# Patient Record
Sex: Female | Born: 1980 | Race: Black or African American | Hispanic: No | Marital: Single | Smoking: Current some day smoker
Health system: Southern US, Community
[De-identification: ages and names within clinical notes are randomized; demographics above are authoritative.]

## PROBLEM LIST (undated history)

## (undated) DIAGNOSIS — I2510 Atherosclerotic heart disease of native coronary artery without angina pectoris: Secondary | ICD-10-CM

## (undated) DIAGNOSIS — N200 Calculus of kidney: Secondary | ICD-10-CM

## (undated) DIAGNOSIS — R350 Frequency of micturition: Secondary | ICD-10-CM

## (undated) DIAGNOSIS — N201 Calculus of ureter: Secondary | ICD-10-CM

## (undated) DIAGNOSIS — N7011 Chronic salpingitis: Secondary | ICD-10-CM

## (undated) DIAGNOSIS — N261 Atrophy of kidney (terminal): Secondary | ICD-10-CM

## (undated) DIAGNOSIS — F419 Anxiety disorder, unspecified: Secondary | ICD-10-CM

## (undated) DIAGNOSIS — I10 Essential (primary) hypertension: Secondary | ICD-10-CM

## (undated) DIAGNOSIS — N390 Urinary tract infection, site not specified: Secondary | ICD-10-CM

## (undated) DIAGNOSIS — N1831 Chronic kidney disease, stage 3a: Secondary | ICD-10-CM

## (undated) DIAGNOSIS — F32A Depression, unspecified: Secondary | ICD-10-CM

## (undated) DIAGNOSIS — R102 Pelvic and perineal pain: Secondary | ICD-10-CM

## (undated) DIAGNOSIS — R3915 Urgency of urination: Secondary | ICD-10-CM

## (undated) DIAGNOSIS — R519 Headache, unspecified: Secondary | ICD-10-CM

## (undated) DIAGNOSIS — R0602 Shortness of breath: Secondary | ICD-10-CM

## (undated) DIAGNOSIS — I5032 Chronic diastolic (congestive) heart failure: Secondary | ICD-10-CM

## (undated) DIAGNOSIS — Z87442 Personal history of urinary calculi: Secondary | ICD-10-CM

## (undated) HISTORY — PX: CYSTOSCOPY/URETEROSCOPY/HOLMIUM LASER/STENT PLACEMENT: SHX6546

## (undated) HISTORY — DX: Atherosclerotic heart disease of native coronary artery without angina pectoris: I25.10

## (undated) HISTORY — PX: APPENDECTOMY: SHX54

## (undated) HISTORY — DX: Chronic diastolic (congestive) heart failure: I50.32

## (undated) HISTORY — PX: KIDNEY SURGERY: SHX687

## (undated) HISTORY — DX: Chronic kidney disease, stage 3a: N18.31

---

## 2000-06-16 ENCOUNTER — Encounter: Payer: Self-pay | Admitting: *Deleted

## 2000-06-16 ENCOUNTER — Ambulatory Visit (HOSPITAL_COMMUNITY): Admission: AD | Admit: 2000-06-16 | Discharge: 2000-06-16 | Payer: Self-pay | Admitting: *Deleted

## 2000-06-18 ENCOUNTER — Ambulatory Visit (HOSPITAL_COMMUNITY): Admission: AD | Admit: 2000-06-18 | Discharge: 2000-06-18 | Payer: Self-pay | Admitting: *Deleted

## 2000-06-24 ENCOUNTER — Inpatient Hospital Stay (HOSPITAL_COMMUNITY): Admission: RE | Admit: 2000-06-24 | Discharge: 2000-06-27 | Payer: Self-pay | Admitting: *Deleted

## 2000-09-22 ENCOUNTER — Other Ambulatory Visit: Admission: RE | Admit: 2000-09-22 | Discharge: 2000-09-22 | Payer: Self-pay | Admitting: *Deleted

## 2001-02-18 ENCOUNTER — Emergency Department (HOSPITAL_COMMUNITY): Admission: EM | Admit: 2001-02-18 | Discharge: 2001-02-18 | Payer: Self-pay | Admitting: Emergency Medicine

## 2001-10-29 ENCOUNTER — Emergency Department (HOSPITAL_COMMUNITY): Admission: EM | Admit: 2001-10-29 | Discharge: 2001-10-29 | Payer: Self-pay | Admitting: *Deleted

## 2002-02-16 ENCOUNTER — Emergency Department (HOSPITAL_COMMUNITY): Admission: EM | Admit: 2002-02-16 | Discharge: 2002-02-16 | Payer: Self-pay | Admitting: *Deleted

## 2002-03-02 ENCOUNTER — Inpatient Hospital Stay (HOSPITAL_COMMUNITY): Admission: EM | Admit: 2002-03-02 | Discharge: 2002-03-03 | Payer: Self-pay | Admitting: Emergency Medicine

## 2002-06-14 ENCOUNTER — Encounter: Payer: Self-pay | Admitting: *Deleted

## 2002-06-14 ENCOUNTER — Ambulatory Visit (HOSPITAL_COMMUNITY): Admission: RE | Admit: 2002-06-14 | Discharge: 2002-06-14 | Payer: Self-pay | Admitting: *Deleted

## 2002-08-31 ENCOUNTER — Ambulatory Visit (HOSPITAL_COMMUNITY): Admission: RE | Admit: 2002-08-31 | Discharge: 2002-08-31 | Payer: Self-pay | Admitting: *Deleted

## 2002-08-31 ENCOUNTER — Encounter: Payer: Self-pay | Admitting: *Deleted

## 2002-09-20 ENCOUNTER — Inpatient Hospital Stay (HOSPITAL_COMMUNITY): Admission: RE | Admit: 2002-09-20 | Discharge: 2002-09-22 | Payer: Self-pay | Admitting: *Deleted

## 2004-01-21 ENCOUNTER — Emergency Department (HOSPITAL_COMMUNITY): Admission: EM | Admit: 2004-01-21 | Discharge: 2004-01-21 | Payer: Self-pay | Admitting: *Deleted

## 2005-12-20 ENCOUNTER — Emergency Department (HOSPITAL_COMMUNITY): Admission: EM | Admit: 2005-12-20 | Discharge: 2005-12-20 | Payer: Self-pay | Admitting: Emergency Medicine

## 2006-01-19 ENCOUNTER — Other Ambulatory Visit: Admission: RE | Admit: 2006-01-19 | Discharge: 2006-01-19 | Payer: Self-pay | Admitting: Unknown Physician Specialty

## 2006-01-19 ENCOUNTER — Encounter (INDEPENDENT_AMBULATORY_CARE_PROVIDER_SITE_OTHER): Payer: Self-pay | Admitting: Specialist

## 2006-02-01 ENCOUNTER — Emergency Department (HOSPITAL_COMMUNITY): Admission: EM | Admit: 2006-02-01 | Discharge: 2006-02-01 | Payer: Self-pay | Admitting: Emergency Medicine

## 2006-07-14 ENCOUNTER — Emergency Department (HOSPITAL_COMMUNITY): Admission: EM | Admit: 2006-07-14 | Discharge: 2006-07-14 | Payer: Self-pay | Admitting: Emergency Medicine

## 2006-07-16 ENCOUNTER — Emergency Department (HOSPITAL_COMMUNITY): Admission: EM | Admit: 2006-07-16 | Discharge: 2006-07-16 | Payer: Self-pay | Admitting: Emergency Medicine

## 2006-07-21 ENCOUNTER — Ambulatory Visit (HOSPITAL_COMMUNITY): Admission: RE | Admit: 2006-07-21 | Discharge: 2006-07-21 | Payer: Self-pay | Admitting: Urology

## 2006-07-29 ENCOUNTER — Ambulatory Visit (HOSPITAL_COMMUNITY): Admission: RE | Admit: 2006-07-29 | Discharge: 2006-07-29 | Payer: Self-pay | Admitting: Urology

## 2006-09-02 ENCOUNTER — Ambulatory Visit (HOSPITAL_COMMUNITY): Admission: RE | Admit: 2006-09-02 | Discharge: 2006-09-02 | Payer: Self-pay | Admitting: Urology

## 2006-09-21 ENCOUNTER — Ambulatory Visit (HOSPITAL_COMMUNITY): Admission: RE | Admit: 2006-09-21 | Discharge: 2006-09-21 | Payer: Self-pay | Admitting: Urology

## 2006-11-02 ENCOUNTER — Ambulatory Visit (HOSPITAL_COMMUNITY): Admission: RE | Admit: 2006-11-02 | Discharge: 2006-11-02 | Payer: Self-pay | Admitting: Urology

## 2007-01-25 ENCOUNTER — Other Ambulatory Visit: Admission: RE | Admit: 2007-01-25 | Discharge: 2007-01-25 | Payer: Self-pay | Admitting: Unknown Physician Specialty

## 2007-01-25 ENCOUNTER — Encounter (INDEPENDENT_AMBULATORY_CARE_PROVIDER_SITE_OTHER): Payer: Self-pay | Admitting: Unknown Physician Specialty

## 2007-06-20 ENCOUNTER — Emergency Department (HOSPITAL_COMMUNITY): Admission: EM | Admit: 2007-06-20 | Discharge: 2007-06-21 | Payer: Self-pay | Admitting: Emergency Medicine

## 2008-03-19 ENCOUNTER — Emergency Department (HOSPITAL_COMMUNITY): Admission: EM | Admit: 2008-03-19 | Discharge: 2008-03-19 | Payer: Self-pay | Admitting: Emergency Medicine

## 2008-05-25 ENCOUNTER — Inpatient Hospital Stay (HOSPITAL_COMMUNITY): Admission: EM | Admit: 2008-05-25 | Discharge: 2008-05-26 | Payer: Self-pay | Admitting: Emergency Medicine

## 2008-05-28 ENCOUNTER — Ambulatory Visit (HOSPITAL_COMMUNITY): Admission: RE | Admit: 2008-05-28 | Discharge: 2008-05-28 | Payer: Self-pay | Admitting: Urology

## 2008-07-18 ENCOUNTER — Ambulatory Visit (HOSPITAL_COMMUNITY): Admission: RE | Admit: 2008-07-18 | Discharge: 2008-07-18 | Payer: Self-pay | Admitting: Urology

## 2008-07-25 ENCOUNTER — Ambulatory Visit (HOSPITAL_COMMUNITY): Admission: RE | Admit: 2008-07-25 | Discharge: 2008-07-25 | Payer: Self-pay | Admitting: Urology

## 2008-09-13 ENCOUNTER — Ambulatory Visit (HOSPITAL_COMMUNITY): Admission: RE | Admit: 2008-09-13 | Discharge: 2008-09-13 | Payer: Self-pay | Admitting: Urology

## 2008-10-10 ENCOUNTER — Ambulatory Visit (HOSPITAL_COMMUNITY): Admission: RE | Admit: 2008-10-10 | Discharge: 2008-10-10 | Payer: Self-pay | Admitting: Urology

## 2008-10-17 ENCOUNTER — Ambulatory Visit (HOSPITAL_COMMUNITY): Admission: RE | Admit: 2008-10-17 | Discharge: 2008-10-17 | Payer: Self-pay | Admitting: Urology

## 2008-11-05 ENCOUNTER — Inpatient Hospital Stay (HOSPITAL_COMMUNITY): Admission: EM | Admit: 2008-11-05 | Discharge: 2008-11-06 | Payer: Self-pay | Admitting: Emergency Medicine

## 2009-05-09 ENCOUNTER — Ambulatory Visit (HOSPITAL_COMMUNITY): Admission: RE | Admit: 2009-05-09 | Discharge: 2009-05-09 | Payer: Self-pay | Admitting: Urology

## 2009-05-16 ENCOUNTER — Ambulatory Visit (HOSPITAL_COMMUNITY): Admission: RE | Admit: 2009-05-16 | Discharge: 2009-05-16 | Payer: Self-pay | Admitting: Urology

## 2009-06-10 ENCOUNTER — Ambulatory Visit (HOSPITAL_COMMUNITY): Admission: RE | Admit: 2009-06-10 | Discharge: 2009-06-10 | Payer: Self-pay | Admitting: Urology

## 2009-07-25 ENCOUNTER — Ambulatory Visit (HOSPITAL_COMMUNITY): Admission: RE | Admit: 2009-07-25 | Discharge: 2009-07-25 | Payer: Self-pay | Admitting: Urology

## 2009-09-02 ENCOUNTER — Ambulatory Visit (HOSPITAL_COMMUNITY): Admission: RE | Admit: 2009-09-02 | Discharge: 2009-09-02 | Payer: Self-pay | Admitting: Urology

## 2009-09-28 ENCOUNTER — Emergency Department (HOSPITAL_COMMUNITY): Admission: EM | Admit: 2009-09-28 | Discharge: 2009-09-28 | Payer: Self-pay | Admitting: Emergency Medicine

## 2010-03-09 ENCOUNTER — Encounter: Payer: Self-pay | Admitting: Urology

## 2010-04-09 ENCOUNTER — Other Ambulatory Visit (HOSPITAL_COMMUNITY): Payer: Self-pay | Admitting: Urology

## 2010-04-14 ENCOUNTER — Other Ambulatory Visit (HOSPITAL_COMMUNITY): Payer: Self-pay | Admitting: Urology

## 2010-04-14 DIAGNOSIS — N200 Calculus of kidney: Secondary | ICD-10-CM

## 2010-04-14 DIAGNOSIS — N390 Urinary tract infection, site not specified: Secondary | ICD-10-CM

## 2010-04-16 ENCOUNTER — Ambulatory Visit (HOSPITAL_COMMUNITY)
Admission: RE | Admit: 2010-04-16 | Discharge: 2010-04-16 | Disposition: A | Discharge status: Home / Self Care | Payer: Medicaid Other | Source: Ambulatory Visit | Attending: Urology | Admitting: Urology

## 2010-04-16 DIAGNOSIS — R1031 Right lower quadrant pain: Secondary | ICD-10-CM | POA: Insufficient documentation

## 2010-04-16 DIAGNOSIS — R1032 Left lower quadrant pain: Secondary | ICD-10-CM | POA: Insufficient documentation

## 2010-04-16 DIAGNOSIS — N200 Calculus of kidney: Secondary | ICD-10-CM | POA: Insufficient documentation

## 2010-04-16 DIAGNOSIS — N390 Urinary tract infection, site not specified: Secondary | ICD-10-CM

## 2010-05-02 LAB — URINE MICROSCOPIC-ADD ON

## 2010-05-02 LAB — URINALYSIS, ROUTINE W REFLEX MICROSCOPIC
Bilirubin Urine: NEGATIVE
Ketones, ur: NEGATIVE mg/dL
Specific Gravity, Urine: 1.02 (ref 1.005–1.030)
Urobilinogen, UA: 0.2 mg/dL (ref 0.0–1.0)

## 2010-05-02 LAB — BASIC METABOLIC PANEL
Calcium: 9.4 mg/dL (ref 8.4–10.5)
Chloride: 112 mEq/L (ref 96–112)
Creatinine, Ser: 1.05 mg/dL (ref 0.4–1.2)
GFR calc Af Amer: >60 mL/min (ref >60)

## 2010-05-02 LAB — CBC
MCV: 82.2 fL (ref 78.0–100.0)
Platelets: 259 10*3/uL (ref 150–400)
RBC: 4.68 MIL/uL (ref 3.87–5.11)
WBC: 9.5 10*3/uL (ref 4.0–10.5)

## 2010-05-02 LAB — DIFFERENTIAL
Lymphocytes Relative: 31 % (ref 12–46)
Lymphs Abs: 2.9 10*3/uL (ref 0.7–4.0)
Neutrophils Relative %: 61 % (ref 43–77)

## 2010-05-02 LAB — POCT PREGNANCY, URINE: Preg Test, Ur: NEGATIVE

## 2010-05-09 LAB — CBC
Hemoglobin: 11.9 g/dL — ABNORMAL LOW (ref 12.0–15.0)
MCHC: 33.3 g/dL (ref 30.0–36.0)
MCV: 82.8 fL (ref 78.0–100.0)
RBC: 4.31 MIL/uL (ref 3.87–5.11)

## 2010-05-09 LAB — BASIC METABOLIC PANEL
CO2: 26 mEq/L (ref 19–32)
Chloride: 107 mEq/L (ref 96–112)
GFR calc Af Amer: >60 mL/min (ref >60)
Potassium: 3.7 mEq/L (ref 3.5–5.1)
Sodium: 139 mEq/L (ref 135–145)

## 2010-05-21 ENCOUNTER — Encounter (HOSPITAL_COMMUNITY): Payer: Medicaid Other

## 2010-05-21 ENCOUNTER — Other Ambulatory Visit: Payer: Self-pay | Admitting: Urology

## 2010-05-21 LAB — SURGICAL PCR SCREEN
MRSA, PCR: NEGATIVE
Staphylococcus aureus: NEGATIVE

## 2010-05-21 LAB — HEMOGLOBIN AND HEMATOCRIT, BLOOD: HCT: 37.2 % (ref 36.0–46.0)

## 2010-05-21 LAB — BASIC METABOLIC PANEL
BUN: 9 mg/dL (ref 6–23)
CO2: 23 mEq/L (ref 19–32)
Chloride: 109 mEq/L (ref 96–112)
Creatinine, Ser: 0.83 mg/dL (ref 0.4–1.2)

## 2010-05-22 ENCOUNTER — Ambulatory Visit (HOSPITAL_COMMUNITY): Payer: Medicaid Other

## 2010-05-22 ENCOUNTER — Ambulatory Visit (HOSPITAL_COMMUNITY)
Admission: RE | Admit: 2010-05-22 | Discharge: 2010-05-22 | Disposition: A | Discharge status: Home / Self Care | Payer: Medicaid Other | Source: Ambulatory Visit | Attending: Urology | Admitting: Urology

## 2010-05-22 DIAGNOSIS — N201 Calculus of ureter: Secondary | ICD-10-CM | POA: Insufficient documentation

## 2010-05-23 LAB — DIFFERENTIAL
Eosinophils Absolute: 0.3 10*3/uL (ref 0.0–0.7)
Eosinophils Relative: 3 % (ref 0–5)
Lymphocytes Relative: 40 % (ref 12–46)
Lymphs Abs: 3.3 10*3/uL (ref 0.7–4.0)
Lymphs Abs: 3.5 10*3/uL (ref 0.7–4.0)
Monocytes Absolute: 0.4 10*3/uL (ref 0.1–1.0)
Monocytes Relative: 5 % (ref 3–12)
Monocytes Relative: 7 % (ref 3–12)
Neutro Abs: 4.3 10*3/uL (ref 1.7–7.7)
Neutrophils Relative %: 52 % (ref 43–77)

## 2010-05-23 LAB — BASIC METABOLIC PANEL
BUN: 12 mg/dL (ref 6–23)
CO2: 24 mEq/L (ref 19–32)
Calcium: 9.2 mg/dL (ref 8.4–10.5)
Chloride: 107 mEq/L (ref 96–112)
Creatinine, Ser: 0.77 mg/dL (ref 0.4–1.2)
GFR calc Af Amer: >60 mL/min (ref >60)
GFR calc non Af Amer: >60 mL/min (ref >60)
GFR calc non Af Amer: >60 mL/min (ref >60)
Glucose, Bld: 110 mg/dL — ABNORMAL HIGH (ref 70–99)
Potassium: 3.6 mEq/L (ref 3.5–5.1)
Sodium: 137 mEq/L (ref 135–145)
Sodium: 139 mEq/L (ref 135–145)

## 2010-05-23 LAB — CBC
HCT: 32.3 % — ABNORMAL LOW (ref 36.0–46.0)
Hemoglobin: 10.9 g/dL — ABNORMAL LOW (ref 12.0–15.0)
Hemoglobin: 12.1 g/dL (ref 12.0–15.0)
MCV: 82.3 fL (ref 78.0–100.0)
Platelets: 265 10*3/uL (ref 150–400)
RBC: 4.44 MIL/uL (ref 3.87–5.11)
RDW: 13.6 % (ref 11.5–15.5)
WBC: 8.3 10*3/uL (ref 4.0–10.5)

## 2010-05-23 LAB — URINALYSIS, ROUTINE W REFLEX MICROSCOPIC
Nitrite: POSITIVE — AB
Protein, ur: 100 mg/dL — AB
Specific Gravity, Urine: 1.02 (ref 1.005–1.030)
Urobilinogen, UA: 0.2 mg/dL (ref 0.0–1.0)

## 2010-05-23 LAB — URINE CULTURE: Colony Count: 8000

## 2010-05-23 LAB — URINE MICROSCOPIC-ADD ON

## 2010-05-23 LAB — PREGNANCY, URINE: Preg Test, Ur: NEGATIVE

## 2010-05-24 LAB — HCG, QUANTITATIVE, PREGNANCY: hCG, Beta Chain, Quant, S: <2 m[IU]/mL (ref <5)

## 2010-05-27 LAB — URINALYSIS, MICROSCOPIC ONLY
Bilirubin Urine: NEGATIVE
Glucose, UA: NEGATIVE mg/dL
Specific Gravity, Urine: 1.025 (ref 1.005–1.030)
pH: 6 (ref 5.0–8.0)

## 2010-05-28 LAB — DIFFERENTIAL
Basophils Absolute: 0.1 10*3/uL (ref 0.0–0.1)
Basophils Relative: 0 % (ref 0–1)
Eosinophils Absolute: 0 10*3/uL (ref 0.0–0.7)
Eosinophils Absolute: 0.1 10*3/uL (ref 0.0–0.7)
Eosinophils Relative: 1 % (ref 0–5)
Lymphs Abs: 3.5 10*3/uL (ref 0.7–4.0)
Monocytes Absolute: 1.1 10*3/uL — ABNORMAL HIGH (ref 0.1–1.0)
Neutro Abs: 9 10*3/uL — ABNORMAL HIGH (ref 1.7–7.7)
Neutrophils Relative %: 77 % (ref 43–77)

## 2010-05-28 LAB — BASIC METABOLIC PANEL
BUN: 14 mg/dL (ref 6–23)
BUN: 14 mg/dL (ref 6–23)
CO2: 26 mEq/L (ref 19–32)
Calcium: 8.2 mg/dL — ABNORMAL LOW (ref 8.4–10.5)
Chloride: 106 mEq/L (ref 96–112)
Chloride: 112 mEq/L (ref 96–112)
Creatinine, Ser: 1.5 mg/dL — ABNORMAL HIGH (ref 0.4–1.2)
GFR calc Af Amer: 50 mL/min — ABNORMAL LOW (ref >60)
Glucose, Bld: 105 mg/dL — ABNORMAL HIGH (ref 70–99)
Glucose, Bld: 91 mg/dL (ref 70–99)
Potassium: 3.4 mEq/L — ABNORMAL LOW (ref 3.5–5.1)
Sodium: 141 mEq/L (ref 135–145)

## 2010-05-28 LAB — CBC
HCT: 34.5 % — ABNORMAL LOW (ref 36.0–46.0)
Hemoglobin: 11.4 g/dL — ABNORMAL LOW (ref 12.0–15.0)
MCHC: 33.8 g/dL (ref 30.0–36.0)
MCV: 82.5 fL (ref 78.0–100.0)
MCV: 82.7 fL (ref 78.0–100.0)
Platelets: 226 10*3/uL (ref 150–400)
Platelets: 261 10*3/uL (ref 150–400)
RBC: 3.7 MIL/uL — ABNORMAL LOW (ref 3.87–5.11)
RDW: 14.2 % (ref 11.5–15.5)
WBC: 14.8 10*3/uL — ABNORMAL HIGH (ref 4.0–10.5)

## 2010-05-28 LAB — URINALYSIS, ROUTINE W REFLEX MICROSCOPIC
Ketones, ur: NEGATIVE mg/dL
Nitrite: NEGATIVE
Protein, ur: NEGATIVE mg/dL

## 2010-05-28 LAB — PREGNANCY, URINE: Preg Test, Ur: NEGATIVE

## 2010-06-02 NOTE — H&P (Signed)
  NAMETYNASIA, [name]              ACCOUNT NO.:  [id number]  MEDICAL RECORD NO.:  [id number]           PATIENT TYPE:  LOCATION:                                 FACILITY:  PHYSICIAN:  Ky Barban, M.D.DATE OF BIRTH:  12-14-1980  DATE OF ADMISSION: DATE OF DISCHARGE:  LH                             HISTORY & PHYSICAL   A 30 year old female who is having pain in the right flank.  No nausea, vomiting, fever, chills.  She is being followed by me because she has history of having kidney stones and previously had lithotripsy done on the left side, also had a stone basket done on the left side.  Now she is having pain on the right side.  CT scan shows there are stones in both kidneys but there is a 5-mm stone in the right mid ureter causing some obstruction, not significant obstruction.  I have advised her to undergo stone basket on the right side.  She is familiar with the procedure of stone basket.  I told her again today, possibility of open surgery was discussed again, use of double-J stent which she does not like, but I told her I will use it only if I have to.  She has been taking Urocit-K and my feeling is stones are not getting bigger in size. She has bigger stone in the left kidney causing no obstruction, so I am going to get rid of the stone from the right ureter, then later on we will talk what to do about the right renal calculi.  She has no fever, chills or gross hematuria.  PAST MEDICAL HISTORY:  No history of significant medical problem like diabetes or hypertension, ESL and stone basket on the left side in 2010.  FAMILY HISTORY:  No history of kidney stones in the family.  SOCIAL HISTORY:  She is married.  She is single, has children and does not smoke or drink.  REVIEW OF SYSTEMS:  Denies any chest pain, nausea, vomiting, fever or chills.  PHYSICAL EXAMINATION:  VITAL SIGNS:  Her blood pressure is 120/80, temperature is normal. CENTRAL NERVOUS SYSTEM:  No  gross neurological deficit. HEAD, NECK, ENT:  Negative.  Neck is supple.  No thyromegaly or lymphadenopathy. CHEST:  Symmetrical.  Normal breath sounds. HEART:  Regular sinus rhythm.  No murmur. ABDOMEN:  Soft, flat.  Liver, spleen, kidneys not palpable.  1+ right and left CVA tenderness. PELVIC:  Not done at this time. EXTREMITIES:  Normal.  IMPRESSION:  Bilateral renal and right ureteral calculus.  PLAN:  Cystoscopy, right retrograde pyelogram, ureteroscopic stone basket extraction, holmium laser lithotripsy and insertion of double-J stent under anesthesia as outpatient.     Ky Barban, M.D.     MIJ/MEDQ  D:  05/21/2010  T:  05/21/2010  Job:  [id number]  Electronically Signed by Alleen Borne M.D. on 06/02/2010 11:59:08 AM

## 2010-06-02 NOTE — Op Note (Signed)
  NAMEHERMAN, MELL              ACCOUNT NO.:  [id number]  MEDICAL RECORD NO.:  [id number]          PATIENT TYPE:  LOCATION:                                 FACILITY:  PHYSICIAN:  Ky Barban, M.D.DATE OF BIRTH:  [birth year]  DATE OF PROCEDURE:  05/22/2010 DATE OF DISCHARGE:                              OPERATIVE REPORT   PREOPERATIVE DIAGNOSIS:  Questionable right distal ureteral calculus.  POSTOPERATIVE DIAGNOSIS:  No ureteral calculus.  PROCEDURE:  Cystoscopy, right retrograde pyelogram.  ANESTHESIA:  General.  PROCEDURE:  The patient under general endotracheal anesthesia in lithotomy position, usual prep and drape, #25 cystoscope introduced into the bladder.  It was inspected, looks normal.  Right ureteral orifice catheterized with a wedge catheter.  Hypaque was injected under fluoroscopic control.  The ureter looks normal in caliber.  The dye goes up into the upper ureter.  I do not see any filling defect in the distal ureter or in the entire length of the ureter.  The renal pelvis fills up nicely.  I do not see any stone in the ureter on the right side, so I decided to quit the procedure.  All the instruments were removed.  The patient left the operating room in satisfactory condition.     Ky Barban, M.D.     MIJ/MEDQ  D:  05/22/2010  T:  05/23/2010  Job:  [id number]  Electronically Signed by Alleen Borne M.D. on 06/02/2010 11:59:10 AM

## 2010-06-03 LAB — URINALYSIS, ROUTINE W REFLEX MICROSCOPIC
Glucose, UA: NEGATIVE mg/dL
Ketones, ur: NEGATIVE mg/dL
Protein, ur: NEGATIVE mg/dL
Urobilinogen, UA: 0.2 mg/dL (ref 0.0–1.0)

## 2010-06-03 LAB — URINE MICROSCOPIC-ADD ON

## 2010-06-03 LAB — URINE CULTURE: Colony Count: 80000

## 2010-06-03 LAB — PREGNANCY, URINE: Preg Test, Ur: NEGATIVE

## 2010-06-27 ENCOUNTER — Emergency Department (HOSPITAL_COMMUNITY): Payer: Medicaid Other

## 2010-06-27 ENCOUNTER — Emergency Department (HOSPITAL_COMMUNITY)
Admission: EM | Admit: 2010-06-27 | Discharge: 2010-06-27 | Disposition: A | Discharge status: Home / Self Care | Payer: Medicaid Other | Attending: Emergency Medicine | Admitting: Emergency Medicine

## 2010-06-27 DIAGNOSIS — N200 Calculus of kidney: Secondary | ICD-10-CM | POA: Insufficient documentation

## 2010-06-27 DIAGNOSIS — R109 Unspecified abdominal pain: Secondary | ICD-10-CM | POA: Insufficient documentation

## 2010-06-27 DIAGNOSIS — N390 Urinary tract infection, site not specified: Secondary | ICD-10-CM | POA: Insufficient documentation

## 2010-06-27 DIAGNOSIS — R110 Nausea: Secondary | ICD-10-CM | POA: Insufficient documentation

## 2010-06-27 LAB — URINE MICROSCOPIC-ADD ON

## 2010-06-27 LAB — URINALYSIS, ROUTINE W REFLEX MICROSCOPIC
Glucose, UA: NEGATIVE mg/dL
Ketones, ur: NEGATIVE mg/dL
Protein, ur: 100 mg/dL — AB
Urobilinogen, UA: 0.2 mg/dL (ref 0.0–1.0)

## 2010-06-29 LAB — URINE CULTURE
Colony Count: 35000
Culture  Setup Time: 201205122010

## 2010-07-01 NOTE — Consult Note (Signed)
[name], [name]              ACCOUNT NO.:  [id number]   MEDICAL RECORD NO.:  [id number]          PATIENT TYPE:  AMB   LOCATION:  DAY                           FACILITY:  [facility]   PHYSICIAN:  Ky Barban, M.D.DATE OF BIRTH:  May 10, 1980   DATE OF CONSULTATION:  DATE OF DISCHARGE:                                 CONSULTATION   CHIEF COMPLAINT:  Left renal colic.   HISTORY:  A 30 year old female.  In 2008, I did a lithotripsy for left  renal calculus.  She has subsequently passed the stone.  Recently, she  had right renal colic, so she was seen in the office in April 2010 and  CT scan shows right ureteral stone, which I had already removed.  There  is a left renal calculus, which was large with hydronephrosis.  I had  left a double-J stent on this patient after I did her first stone  removal along the left side.  I put her on Urocit-K.  She has not been  coming to the office regularly and not taking her medicine on a regular  basis and now she is scheduled to have lithotripsy for left renal  calculus.  I have already inserted a double-J stent on that side.   PAST MEDICAL HISTORY:  No other significant medical problem except as I  mentioned above.  No diabetes or hypertension.  She had ESL done for  left upper ureteral calculus, stone did not break.  Subsequently, I  found out the stone has moved into the mid ureter, so I went ahead and  did a stone basket and laser lithotripsy.   REVIEW OF SYSTEMS:  Unremarkable.   PHYSICAL EXAMINATION:  GENERAL:  Well-nourished, well-developed female  not in acute distress.  VITAL SIGNS:  Blood pressure is 126/71, temperature is normal.  CENTRAL NERVOUS SYSTEM:  No gross neurological deficit.  HEAD, NECK, EYES, AND ENT:  Negative.  CHEST:  Symmetrical.  HEART:  Regular sinus rhythm.  No murmur.  ABDOMEN:  Soft, flat.  Liver, spleen, and kidneys are not palpable.  No  CVA tenderness.  PELVIC:  Unremarkable.   IMPRESSION:  Left renal  calculus with hydronephrosis.   PLAN:  ESL left renal calculus.  She already has double-J stent on that  side and she is familiar with the procedure.  I have told her the  procedure, possible limitation, and complication.      Ky Barban, M.D.  Electronically Signed    MIJ/MEDQ  D:  07/17/2008  T:  07/18/2008  Job:  [id number]

## 2010-07-01 NOTE — H&P (Signed)
[name], [name]              ACCOUNT NO.:  [id number]   MEDICAL RECORD NO.:  [id number]          PATIENT TYPE:  AMB   LOCATION:  DAY                           FACILITY:  [facility]   PHYSICIAN:  Ky Barban, M.D.DATE OF BIRTH:  [birth year]   DATE OF ADMISSION:  DATE OF DISCHARGE:  LH                              HISTORY & PHYSICAL   CHIEF COMPLAINT:  Recurrent left renal colic.   A 30 year old female for the last several weeks having on and off  attacks of left renal colic. Went to the emergency room second time.  CT  scan was done.  She has an 8.4 x 6 mm stone in the left upper ureter.  No fever or chills.  She is having episodes of gross hematuria also.  I  have advised her to undergo ESL for which she is coming as outpatient.  Will do ESL procedure.  Limitations, complications discussed.   PAST MEDICAL HISTORY:  Appendectomy at the age of 7.   No diabetes or hypertension.   PERSONAL HISTORY:  Does not smoke or drink.   REVIEW OF SYSTEMS:  Unremarkable.   PHYSICAL EXAMINATION:  VITAL SIGNS: Blood pressure 110/80, temperature  normal.  CENTRAL NERVOUS SYSTEM:  No gross neurological deficits.  HEAD AND NECK:  Eyes and ENT negative.  CHEST:  Symmetrical.  ABDOMEN:  Soft, flat.  Liver, spleen, kidneys are not palpable.  No CVA  tenderness.  PELVIC:  No adnexal mass or tenderness.   IMPRESSION:  Left ureteral calculus.   PLAN:  ESL left ureteral calculus.      Ky Barban, M.D.  Electronically Signed     MIJ/MEDQ  D:  07/20/2006  T:  07/20/2006  Job:  [id number]

## 2010-07-01 NOTE — Op Note (Signed)
NAMEKERYN, NESSLER              ACCOUNT NO.:  [id number]   MEDICAL RECORD NO.:  [id number]          PATIENT TYPE:  AMB   LOCATION:  DAY                           FACILITY:  [facility]   PHYSICIAN:  Ky Barban, M.D.DATE OF BIRTH:  [birth year]   DATE OF PROCEDURE:  DATE OF DISCHARGE:                               OPERATIVE REPORT   PREOPERATIVE DIAGNOSIS:  Right ureteral and left renal calculi.   POSTOPERATIVE DIAGNOSIS:  Right ureteral and left renal calculi.   PROCEDURE:  Cystoscopy, right retrograde pyelogram, right ureteroscopic  stone extraction, no double-J stent.  On left side, left retrograde  pyelogram, insertion of double-J stent size 5-French 24 cm and no string  attached.   OPERATIVE FINDINGS:  On the left side on retrograde pyelogram, I see  markedly dilated upper pole calix and also stone in the ureteropelvic  junction.   ANESTHESIA:  General.   DESCRIPTION OF PROCEDURE:  The patient under general endotracheal  anesthesia in lithotomy position after usual prep and drape, #25  cystoscope introduced into the bladder.  The right ureteral orifice  catheterized with Wedge catheter.  Hypaque is injected under  fluoroscopic control.  There is a filling defect in the right  ureterovesical junction that is where the stone is.  Then I introduced a  guidewire up into the renal pelvis.  Over the guide wire, #15 balloon  dilator was introduced, intramural ureter was dilated.  Balloon is  removed and the ureteroscope was introduced along the side of the  guidewire, went to the level of the stone which has moved up into the  lower ureter, but luckily I was able to catch it with a basket and  removed without any problem.  At this point, I decided not to leave a  double-J stent on the right side so later on I removed the guidewire.  On the left side, I introduced a Wedge catheter in the left orifice and  Hypaque was injected.  The ureter looks fine till it gets into the  ureteropelvic junction that is where the filling defect is, that is the  stone in the ureteropelvic junction, the dye goes into the renal pelvis.  The upper pole calix is markedly dilated so apparently there is  obstruction at the upper pole calix in the infundibulum.  I was able to  pass a guidewire up into the renal and to the upper pole calix and a 5-  French 24 cm double-J stent was positioned under fluoroscopic control.  It appears to be in the upper pole calix.  The guidewire has been  removed and I also removed the string.  A nice loop is obtained in the  upper pole calix and also in the bladder.  All the instruments were  removed.  The patient left the operating room in satisfactory condition.      Ky Barban, M.D.  Electronically Signed     MIJ/MEDQ  D:  05/28/2008  T:  05/29/2008  Job:  [id number]

## 2010-07-01 NOTE — Consult Note (Signed)
[name], [name]              ACCOUNT NO.:  [id number]   MEDICAL RECORD NO.:  [id number]          PATIENT TYPE:  AMB   LOCATION:  DAY                           FACILITY:  [facility]   PHYSICIAN:  Ky Barban, M.D.DATE OF BIRTH:  1980/03/14   DATE OF CONSULTATION:  DATE OF DISCHARGE:                                 CONSULTATION   CHIEF COMPLAINT:  Recurrent left renal calculus.   HISTORY:  A 30 year old female is known to me.  She has a large stone in  the left kidney and it she had undergone ESL once before, but the stone  has come back and is larger, has a double-J stent.  Part of the stone  was in the left upper ureter which was treated with ESL and I did not  see it on the follow up KUB with the radiologist.  Think it has moved  down at the level of L5.  Anyway, if I see it then I would need to treat  that first and then we have to bring her back to treat her for left  renal calculus.   REVIEW OF SYSTEM:  Unremarkable.   EXAMINATION:  CONSTITUTIONAL:  Well-nourished, well-developed female not  in acute distress, fully conscious, alert, oriented.  CENTRAL NERVOUS SYSTEM:  Negative.  HEENT:  Negative.  CHEST:  Clear.  HEART:  Regular sinus rhythm, no murmur.  ABDOMINAL:  Soft, flat.  Liver, spleen, kidneys not palpable.  No CVA  tenderness.  PELVIC:  Exam unremarkable.   IMPRESSION:  Left ureteral and renal calculus post double-J stent.   PLAN:  Treat left ureteral and renal calculus with ESL procedure.  Limitations, complications need for additional procedure discussed.      Ky Barban, M.D.  Electronically Signed     MIJ/MEDQ  D:  10/09/2008  T:  10/09/2008  Job:  [id number]

## 2010-07-01 NOTE — Op Note (Signed)
[name], [name]              ACCOUNT NO.:  [id number]   MEDICAL RECORD NO.:  [id number]          PATIENT TYPE:  AMB   LOCATION:  DAY                           FACILITY:  [facility]   PHYSICIAN:  Ky Barban, M.D.DATE OF BIRTH:  1980/12/03   DATE OF PROCEDURE:  09/21/2006  DATE OF DISCHARGE:                               OPERATIVE REPORT   PREOPERATIVE DIAGNOSIS:  Left ureteral calculus.   POSTOPERATIVE DIAGNOSIS:  Left ureteral calculus.   PROCEDURE:  Cystoscopy, left retrograde pyelogram, ureteroscopic stone  basket and extraction of stone, holmium laser lithotripsy, insertion of  double-J stent.  No string attached.   ANESTHESIA:  General endotracheal.   PROCEDURE:  The patient under general endotracheal anesthesia in  lithotomy position after usual prep and drape #25 cystoscope introduced  into the bladder.  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  The left ureteral orifice was  catheterized with a wedge catheter, Hypaque is injected under  fluoroscopic control.  There is a filling defect which is a stone in the  ureter just below the common iliac vessels on the left side.  The ureter  above that is markedly dilated.  At this point a guidewire was passed  beyond the stone into the renal pelvis and the cystoscope was removed.  A short rigid ureteroscope was introduced and I went into the ureter to  the level of the stone.  At that point stone basket was passed under  vision above the stone and the stone was engaged in the basket.  The  stone is broken with the help of a holmium laser.  These pieces of the  stones were then removed completely.  At the end the ureter was  inspected, looks fine.  The ureteroscope were removed and some of the  pieces of the stones were in the bladder.  They were evacuated with the  help of an Ellik evacuator.  A #25 cystoscope was reintroduced over the  guidewire and a 5-French 24 cm double-J  stent is introduced over the  guidewire.  The string has been removed.  The stent was positioned  within the renal pelvis and the bladder guidewire is removed.  Nice loop  in the bladder and the renal pelvis was obtained.  All the instruments  were removed.  The patient left the operating room in satisfactory  condition.      Ky Barban, M.D.  Electronically Signed     MIJ/MEDQ  D:  09/21/2006  T:  09/21/2006  Job:  [id number]

## 2010-07-01 NOTE — H&P (Signed)
[name], [name]              ACCOUNT NO.:  [id number]   MEDICAL RECORD NO.:  [id number]         PATIENT TYPE:  AMB   LOCATION:  DAY                           FACILITY:  [facility]   PHYSICIAN:  Ky Barban, M.D.DATE OF BIRTH:  [birth year]   DATE OF ADMISSION:  DATE OF DISCHARGE:  LH                              HISTORY & PHYSICAL   CHIEF COMPLAINT:  Left flank pain.   HISTORY:  A 30 year old female, two months ago had ESL done for left  upper ureteral calculus, stone was not completely broken.  I have been  following her ever since then.  The stone is still in the ureter but it  has move on into the mid ureter level and I told her that we need to  retrieve this stone or we can go in with a stone basket.  After  discussing various pros and cons she decided to go along with the stone  basket procedure.  Limitations, complications discussed.  She  understands and wants me to go ahead and schedule.  I have discussed  with her the possibility of ureter perforation leading to open surgery.  No guarantees about her results.   PAST HISTORY:  Otherwise unremarkable.   PHYSICAL EXAMINATION:  GENERAL:  __________  built, not in acute  distress.  CENTRAL NERVOUS SYSTEM:  Negative.  HEENT:  Negative.  CHEST:  Symmetrical.  HEART:  Regular sinus rhythm.  No murmur.  ABDOMEN:  Soft, flat, liver, spleen, kidneys are not palpable.  No CVA  tenderness.  PELVIC:  Negative.  EXTREMITIES:  Normal.   IMPRESSION:  Left ureteral calculus.   PLAN:  Cystoscopy, left retrograde pyelogram, ureteroscopic stone  basket, __________  laser lithotripsy, insertion of double-J stent under  anesthesia as outpatient.      Ky Barban, M.D.  Electronically Signed     MIJ/MEDQ  D:  09/20/2006  T:  09/20/2006  Job:  [id number]

## 2010-07-01 NOTE — H&P (Signed)
[name], HARDEN              ACCOUNT NO.:  [id number]   MEDICAL RECORD NO.:  [id number]          PATIENT TYPE:  INP   LOCATION:  A330                          FACILITY:  [facility]   PHYSICIAN:  Margaretmary Dys, M.D.DATE OF BIRTH:  [birth year]   DATE OF ADMISSION:  05/25/2008  DATE OF DISCHARGE:  LH                              HISTORY & PHYSICAL   ADMISSION DIAGNOSES:  1. Bilateral hydronephrosis secondary to obstructing calculi, left      greater than the right.  2. Two large left ureteropelvic junction calculi.  3. Obstructing right ureterovesical junction calculus.   CHIEF COMPLAINTS:  Acute right-sided abdominal pain.   HISTORY OF PRESENT ILLNESS:  [name] is a 30 year old female who has  a previous history of ureteral stones.  The patient was previously seen  in the past by Dr. Jerre Simon who has performed cystoscopy and also stent  placement in both ureters back in August 2008.  The patient has had  symptoms on and off over the past couple of years.  However today,  patient reports having significant abdominal pain especially in the  right flank.  She denies any hematuria.  She has had no fevers or  chills.  She denies any rash.  No headaches.  She had no nausea or  vomiting.  Initially, the pain was 10/10.  It was nonradiating, mostly  in her right flank.  She subsequently kind of laid down. The pain went  away, but came back this afternoon. Subsequently, she decided to come  into the emergency room.  Evaluation in the emergency room was really  unremarkable.  Her physical exam was negative.  She was hemodynamically  stable with no evidence of sepsis.   The patient had a CT scan of her abdomen and pelvis which confirmed  bilateral hydronephrosis with evidence of obstructing calculi.  The  patient will likely need an ESL procedure cystoscopy and we will be  consulting Dr. Jerre Simon.   REVIEW OF SYSTEMS:  As mentioned in the history of present illness.   PAST MEDICAL  HISTORY:  1. History of kidney stones status post ESL procedure with double-J      stent placement.  This was back in August 2008.  1. Status post appendectomy.   MEDICATIONS:  None.   ALLERGIES:  NO KNOWN DRUG ALLERGIES.   SOCIAL HISTORY:  The patient is single, has 2 children.  Currently is  not working.  Smokes about 1 pack of cigarettes a day with about a 10-  pack history of smoking.  Denies any alcohol or illicit drug use.   FAMILY HISTORY:  Positive for history of kidney stones and gallbladder  stones in uncles and aunts.   PHYSICAL EXAMINATION:  GENERAL:  The patient was conscious, alert,  comfortable in some mild distress from pain intermittently.  VITAL SIGNS:  Blood pressure on arrival in the emergency room was 123/63  with a pulse of 76, respirations 20, temperature is 98.5 degrees  Fahrenheit, oxygen saturation was 99% on room air.  HEENT:  Normocephalic, atraumatic.  Oral mucosa was moist with no  exudates.  NECK:  Supple.  No JVD or lymphadenopathy.  LUNGS:  Clear clinically with good air entry bilaterally.  HEART:  S1-S2 regular.  No excessive gallops or rubs.  ABDOMEN:  Soft, nontender.  Bowel sounds positive.  No mass is palpable.  EXTREMITIES:  No edema.  She reports pain currently as 0/10.  CNS:  Grossly intact.  No focal neurological deficits.   LABORATORY DATA:  White blood count 14.8, hemoglobin of 11.4, hematocrit  was 34.5, platelet count 261 with no left shift, sodium is 141,  potassium is 3.4, chloride of 106, CO2 was 26, glucose is 105, BUN of  14, creatinine was 1.1.  Pregnancy test was negative.  Urinalysis showed  large amounts of blood, small leukocytes and few bacteria.   DIAGNOSTICS:  CT scan of the abdomen and pelvis confirms findings  mentioned above, bilateral hydronephrosis and also calculi.   ASSESSMENT:  This is a 30 year old with prior history of kidney stones  who presents again with right flank pain.  Symptoms consistent with  acute  renal colic.  CT scan shows bilateral hydronephrosis with  obstructing calculi.   PLAN:  1. We will admit the patient to the medical floor.  2. We will start her on IV fluids of normal saline at 200 mL an hour.  3. We will try to strain all her urine.  4. Control her pain with Dilaudid p.r.n. and also with Toradol p.r.n.  5. We will Dr. Alleen Borne, the urologist, to see her as soon as      possible.  The patient may require a cystoscopy.  6. Her kidney function appears to be stable at this time and I will      continue to follow closely.   Discussed the above plan with the patient who verbalized full  understanding.  DVT prophylaxis will be with Lovenox.  We will control  her nausea as needed with Zofran and Phenergan.      Margaretmary Dys, M.D.  Electronically Signed     AM/MEDQ  D:  05/25/2008  T:  05/26/2008  Job:  [id number]

## 2010-07-01 NOTE — Discharge Summary (Signed)
NAMEARRETTA, [name]              ACCOUNT NO.:  [id number]   MEDICAL RECORD NO.:  [id number]          PATIENT TYPE:  INP   LOCATION:  A330                          FACILITY:  [facility]   PHYSICIAN:  Dorris Singh, DO    DATE OF BIRTH:  1980/04/29   DATE OF ADMISSION:  05/25/2008  DATE OF DISCHARGE:  04/10/2010LH                               DISCHARGE SUMMARY   ADMISSION DIAGNOSES:  1. Bilateral hydronephrosis secondary to obstructive calculi, left      greater than right.  2. Large left uteropelvic junction calculi.  3. Obstructing right uterovesical junction calculus.   DISCHARGE DIAGNOSES:  1. Bilateral hydronephrosis secondary to obstructive calculi, left      greater than right.  2. Large left uteropelvic junction calculi.  3. Obstructing right uterovesical junction calculus.   Her testing that was done while she was here.  She had a CT of the  abdomen without contrast, which shows bilateral hydronephrosis related  to obstructing calculi left greater than right, large left ureteral  pelvic junction calculi, there is 1 obstructing right ureteral vesicle  junction calculus.  Her consults are remaining include Dr. Jerre Simon.  He  was seen her before and admitted her further for similar issues.   HOSPITAL COURSE:  The patient's H and P was done by Dr. Sherle Poe,  please refer.  The patient was admitted to the service following  diagnoses.  She was placed on IV hydration as well as pain medication.  Dr. Jerre Simon was consulted.  Dr. Jerre Simon saw her today and stated that he  agrees that she can be discharged and go home and he can do the testing  that she will need to have done on Monday, which would include a  cystoscopy with possible stenting.  The patient was given these  instructions by Dr. Jerre Simon.  She was sent home on the following  medications, Percocet 1 p.o. q.4-6 hours.  She will follow up with Dr.  Jerre Simon on Monday.   CONDITION:  Stable.   DISPOSITION:  Will be to  home.      Dorris Singh, DO  Electronically Signed     CB/MEDQ  D:  05/26/2008  T:  05/27/2008  Job:  [id number]

## 2010-07-04 NOTE — Discharge Summary (Signed)
   [name], Emily                        ACCOUNT NO.:  [id number]   MEDICAL RECORD NO.:  [id number]                   PATIENT TYPE:  INP   LOCATION:  A417                                 FACILITY:  [facility]   PHYSICIAN:  Langley Gauss, M.D.                DATE OF BIRTH:  [birth year]   DATE OF ADMISSION:  09/20/2002  DATE OF DISCHARGE:  09/22/2002                                 DISCHARGE SUMMARY   DISCHARGE MEDICATIONS:  1. Tylox.  2. Motrin.   Patient is given advice to follow up in the office with Dr. Lisette Grinder in four  weeks time.  Mother and infant discharged together.   DIAGNOSES:  1. A 37-week intrauterine pregnancy presenting in active labor.  2. Unknown Group B streptococcus status, thus the patient was treated     empirically during the course of labor.   On initial examination the patient presented at 4-cm dilated, 75% effaced  and -1 station with bulging intact membranes.  Delivery performed is  spontaneous assisted vaginal delivery of a female infant.  Delivered over an  intact perineum with Apgar's of 9 and 10.  The patient received narcotic  analgesia during the course of labor.   HOSPITAL COURSE:  As stated previously, the patient presented at 4-cm  dilated, 75% effaced, -1 station.  She was admitted in active labor.  She  was treated with IV Ampicillin during the course of labor.  Patient,  thereafter, progressed to a non complicated delivery over the intact  perineum.  She had no postpartum complications and thus was discharged  postpartum day number two.  The patient desires either IUD versus bilateral  sterilization.  The infant did well, weighed 5 pounds and 1 ounce.   PERTINENT LABORATORY STUDIES:  Include admission hemoglobin hematocrit  35.4/11.7, postpartum day number one 32.1/10.8.   As stated previously, the patient is to follow up in the office of Dr.  Lisette Grinder in four weeks time.   All care including discharge services performed by [facility].                                                Langley Gauss, M.D.    DC/MEDQ  D:  10/20/2002  T:  10/20/2002  Job:  [id number]

## 2010-07-04 NOTE — H&P (Signed)
   NAMETAWNIA, SCHIRM                        ACCOUNT NO.:  [id number]   MEDICAL RECORD NO.:  [id number]                   PATIENT TYPE:  INP   LOCATION:  A417                                 FACILITY:  [facility]   PHYSICIAN:  Langley Gauss, M.D.                DATE OF BIRTH:  July 22, 1980   DATE OF ADMISSION:  03/01/2002  DATE OF DISCHARGE:  03/03/2002                                HISTORY & PHYSICAL   Amended dates:  The patient initially presented on March 01, 2002 on an  observation status.  This is a 30 year old first trimester pregnancy who  presents with a chief complaint of two weeks' duration of nausea and  vomiting.  Denied any pain.  States that on New Year's Day she was seen in  the emergency room for the same complaint.  She now, however, provides the  additional information March 01, 2002 that she is now pregnant.   PAST MEDICAL HISTORY:  No significant medical history or surgical history.   ALLERGIES:  No known drug allergies.   CURRENT MEDICATIONS:  None.   PHYSICAL EXAMINATION:  GENERAL:  Appears nauseated.  VITAL SIGNS:  Temperature 99.2, blood pressure 129/55, pulse rate of 91,  respiratory rate 20.  HEENT:  Negative.  Mucous membranes are moist.  NECK:  Supple.  LUNGS:  Clear.  CARDIOVASCULAR:  Regular rate and rhythm.  ABDOMEN:  Soft and nontender.  No surgical scars are identified.  No rebound  or guarding is identified.  PELVIC:  Uterus is not enlarged.  External genitalia within normal limits.   ASSESSMENT:  First trimester pregnancy with hyperemesis gravidarum.  The  patient is to be initially on observation status.  She will be treated with  IV fluid resuscitative therapy and antiemetics.  Gestational age to be  determined from review of previous medical record.                                               Langley Gauss, M.D.    DC/MEDQ  D:  03/14/2002  T:  03/14/2002  Job:  [id number]

## 2010-07-04 NOTE — H&P (Signed)
   [name], [name]                        ACCOUNT NO.:  [id number]   MEDICAL RECORD NO.:  [id number]                   PATIENT TYPE:  INP   LOCATION:  LDR3                                 FACILITY:  [facility]   PHYSICIAN:  Tilda Burrow, M.D.              DATE OF BIRTH:  12/09/1980   DATE OF ADMISSION:  09/20/2002  DATE OF DISCHARGE:                                HISTORY & PHYSICAL   CHIEF COMPLAINT:  Painful uterine contractions since about 0200.   HISTORY OF PRESENT ILLNESS:  [name] is a 30 year old gravida 2, para 1 with  an EDC of October 12, 2002, based on a reliable last menstrual period and a  17-week ultrasound.  She began prenatal care at 17 weeks and has had what  appears to be fairly regular visits since.   PRENATAL LABORATORY DATA:  Blood type A positive, rubella immune.  HIV,  HBsAg, RPR are negative.  There is no one-hour GTT or GBS status on the  available prenatal records.  Blood pressures have been 100s-120s/50s.  Fundal height, growth has been appropriate.  The pregnancy appears to have  been uneventful.   PAST MEDICAL, SURGICAL HISTORY:  There is no pertinent past medical or past  surgical history.   OBSTETRICAL HISTORY:  Delivery of a 4-pound 13-ounce female infant at 37  weeks.  The pregnancy was complicated by IUGR and oligohydramnios.   ALLERGIES:  No known drug allergies.   PHYSICAL EXAMINATION:  HEENT:  Within normal limits.  HEART:  Regular rate and rhythm.  LUNGS:  Clear.  ABDOMEN:  Soft and nontender.  Having moderate to strong uterine  contractions every three minutes lasting 60-90 seconds.  Fetal heart rate is  140s.  Average long-term variability.  Accelerations present, no  decelerations.  EXTREMITIES:  Legs are negative.  PELVIC:  Upon admission was 4, 75, -1.  Exam 1-1/2 hours later at 6 cm, 100%  effaced, 0 station, artificial rupture of membranes reveals clear fluid.   IMPRESSION:  Intrauterine pregnancy at 37 weeks, active labor.   Reassuring  maternal fetal status.  Unknown group B strep status.   PLAN:  GBS prophylaxis.  Culture was obtained prior to instituting  antibiotic therapy.  Expectant management.    [name], C.N.M.          Tilda Burrow, M.D.   FC/MEDQ  D:  09/20/2002  T:  09/20/2002  Job:  [id number]   cc:   [facility]

## 2010-07-04 NOTE — Discharge Summary (Signed)
   NAMEHERMAN, [name]                        ACCOUNT NO.:  [id number]   MEDICAL RECORD NO.:  [id number]                   PATIENT TYPE:  INP   LOCATION:  A417                                 FACILITY:  [facility]   PHYSICIAN:  Langley Gauss, M.D.                DATE OF BIRTH:  11-04-1980   DATE OF ADMISSION:  03/01/2002  DATE OF DISCHARGE:  03/03/2002                                 DISCHARGE SUMMARY   AMENDED DATES:  Observation status from March 01, 2002 to March 02, 2002  and March 02, 2002 due to failure of adequate therapeutic response.  The  patient was admitted and then discharged to home on March 03, 2002.   FINAL DIAGNOSES:  1. First trimester pregnancy with hyperemesis gravidarum.  2. Metabolic disturbance as shown by hypokalemia and hyponatremia.   LABORATORIES:  Initial sodium 132, potassium 3.1, chloride 104, CO2 21.  Quantitative hCG obtained by the ER staff is 117, 0.67.  Hemoglobin 11.6,  hematocrit 35.0 with a white count of 8.6.   HOSPITAL COURSE:  Observation status on March 01, 2002.  The patient was  treated with D5 LR fluid bolus as well as IM followed by IV Phenergan.  On  March 02, 2002 patient had slightly increased her p.o. intake, however,  was taking liquids and solids very poorly.  Thus, on March 02, 2002  patient was admitted and fluids and antiemetics are continued.  On March 03, 2002 patient is now ambulatory, tolerating regular general diet.  Had  been much improved overall appetite and intake.  Thus, she was discharged to  home on March 03, 2002 to continue with Phenergan tablets 25 mg p.o. q.6h.  p.r.n.   DISPOSITION:  The patient is to follow up in the office in the next one to  two weeks' time for her first OB visit.                                               Langley Gauss, M.D.    DC/MEDQ  D:  03/14/2002  T:  03/14/2002  Job:  [id number]

## 2010-07-04 NOTE — Op Note (Signed)
   NAME:  [name], [name]                        ACCOUNT NO.:  [id number]   MEDICAL RECORD NO.:  [id number]                   PATIENT TYPE:  INP   LOCATION:  LDR3                                 FACILITY:  [facility]   PHYSICIAN:  Tilda Burrow, M.D.              DATE OF BIRTH:  1980-12-20   DATE OF PROCEDURE:  DATE OF DISCHARGE:                                 OPERATIVE REPORT   DELIVERY NOTE   [name] came fully dilated with an urge to push at approximately 0620 after  a very brief second stage.  She delivered a viable female infant at 14.  Apgars were  9 and 10.  Weight 5 pounds 1 ounce.  The mouth and nose were  suctioned with a bulb syringe on the perineum and the baby delivered easily  with the next push.  The cord was doubly clamped and cut and the baby placed  on the mother's abdomen.  Twenty units of Pitocin diluted in 1000 cc of  lactated Ringers was then infused rapidly IV.   The placenta separated spontaneously and was delivered via controlled cord  traction at [phone].  It was inspected and appears to be intact with a 3-vessel  cord.  The fundus was immediately firm; and minimal blood loss was noted.  The vagina was then inspected and no lacerations were found.  Estimated  blood loss 300 cc.     [name], C.N.M.          Tilda Burrow, M.D.    FC/MEDQ  D:  09/20/2002  T:  09/20/2002  Job:  [id number]   cc:   [facility]   Langley Gauss, M.D.  [address]  [place]  [place] [address]  Fax: [phone]

## 2010-07-04 NOTE — Discharge Summary (Signed)
   [name], [name]                        ACCOUNT NO.:  [id number]   MEDICAL RECORD NO.:  [id number]                   PATIENT TYPE:  INP   LOCATION:  A417                                 FACILITY:  [facility]   PHYSICIAN:  Langley Gauss, M.D.                DATE OF BIRTH:  24-Jan-1981   DATE OF ADMISSION:  03/01/2002  DATE OF DISCHARGE:  03/03/2002                                 DISCHARGE SUMMARY   DIAGNOSIS:  First trimester pregnancy with hyperemesis gravidarum.   DISPOSITION:  At the time of discharge, the patient was given a prescription  for Phenergan 25 mg p.o. q.6h. p.r.n. for nausea.  She will follow up in the  office in one week's time or sooner if nausea and vomiting again become a  problem.   PERTINENT LABORATORY STUDIES:  Slightly low potassium at 3.1 upon initial  evaluation.  This should have corrected itself during the hospitalization  with increased hydration.  Quantitative beta hCG performed by the emergency  room staff revealed  177, 067.   HOSPITAL COURSE:  The patient presented on 03/01/02 with the complaint of two  weeks' duration of nausea and vomiting.  She denied any pain.  She states  that she was here on New Year's day for the same exact complaints.  She now,  however, provides the history that she is pregnant.   PAST MEDICAL HISTORY:  No significant medical or surgical history.   ALLERGIES:  No known drug allergies.   CURRENT MEDICATIONS:  None.   PHYSICAL EXAMINATION:  VITAL SIGNS:  Temperature 99.2, blood pressure  129/55, pulse rate of 91, respiratory rate 20.  HEENT:  Negative.  Mucous membranes are moist.  NECK:  Supple.  Thyroid is nonpalpable.  LUNGS:  Clear.  CARDIOVASCULAR:  Regular rate and rhythm.  ABDOMEN:  Soft, nontender.  No surgical scars are identified.  GENITALIA:  External genitalia within normal limits.   ASSESSMENT:  First trimester pregnancy, hyperemesis gravidarum.  The patient  is kept on observation status from January  14 to March 02, 2002, at which  time she was treated with IV Phenergan.  On March 02, 2002, the patient  is able to tolerate p.o. Phenergan.  In addition, she has tolerated a  regular general diet and her overall status is much improved.  Thus, on  March 02, 2002, the patient is discharged to home and is to be followed up  as clinically indicated.                                               Langley Gauss, M.D.    DC/MEDQ  D:  03/07/2002  T:  03/07/2002  Job:  [id number]

## 2010-07-22 ENCOUNTER — Emergency Department (HOSPITAL_COMMUNITY)
Admission: EM | Admit: 2010-07-22 | Discharge: 2010-07-23 | Disposition: A | Discharge status: Home / Self Care | Payer: Medicaid Other | Attending: Emergency Medicine | Admitting: Emergency Medicine

## 2010-07-22 DIAGNOSIS — N200 Calculus of kidney: Secondary | ICD-10-CM | POA: Insufficient documentation

## 2010-07-22 DIAGNOSIS — R109 Unspecified abdominal pain: Secondary | ICD-10-CM | POA: Insufficient documentation

## 2010-07-22 DIAGNOSIS — N390 Urinary tract infection, site not specified: Secondary | ICD-10-CM | POA: Insufficient documentation

## 2010-07-22 LAB — URINE MICROSCOPIC-ADD ON

## 2010-07-22 LAB — URINALYSIS, ROUTINE W REFLEX MICROSCOPIC
Glucose, UA: NEGATIVE mg/dL
Specific Gravity, Urine: 1.02 (ref 1.005–1.030)

## 2010-07-22 LAB — DIFFERENTIAL
Basophils Relative: 0 % (ref 0–1)
Eosinophils Absolute: 0.2 10*3/uL (ref 0.0–0.7)
Eosinophils Relative: 2 % (ref 0–5)
Lymphs Abs: 2.4 10*3/uL (ref 0.7–4.0)
Monocytes Relative: 1 % — ABNORMAL LOW (ref 3–12)

## 2010-07-22 LAB — BASIC METABOLIC PANEL
BUN: 12 mg/dL (ref 6–23)
Calcium: 10.3 mg/dL (ref 8.4–10.5)
Creatinine, Ser: 1.03 mg/dL (ref 0.4–1.2)
GFR calc non Af Amer: >60 mL/min (ref >60)

## 2010-07-22 LAB — CBC
MCH: 27.6 pg (ref 26.0–34.0)
MCV: 83.9 fL (ref 78.0–100.0)
Platelets: 278 10*3/uL (ref 150–400)
RDW: 13.6 % (ref 11.5–15.5)
WBC: 9.9 10*3/uL (ref 4.0–10.5)

## 2010-07-24 LAB — URINE CULTURE

## 2010-11-04 ENCOUNTER — Encounter: Payer: Self-pay | Admitting: *Deleted

## 2010-11-04 ENCOUNTER — Emergency Department (HOSPITAL_COMMUNITY): Payer: Medicaid Other

## 2010-11-04 ENCOUNTER — Emergency Department (HOSPITAL_COMMUNITY)
Admission: EM | Admit: 2010-11-04 | Discharge: 2010-11-04 | Disposition: A | Discharge status: Home / Self Care | Payer: Medicaid Other | Attending: Emergency Medicine | Admitting: Emergency Medicine

## 2010-11-04 DIAGNOSIS — R1032 Left lower quadrant pain: Secondary | ICD-10-CM | POA: Insufficient documentation

## 2010-11-04 DIAGNOSIS — R109 Unspecified abdominal pain: Secondary | ICD-10-CM | POA: Diagnosis present

## 2010-11-04 DIAGNOSIS — R110 Nausea: Secondary | ICD-10-CM | POA: Insufficient documentation

## 2010-11-04 DIAGNOSIS — N390 Urinary tract infection, site not specified: Secondary | ICD-10-CM | POA: Diagnosis present

## 2010-11-04 DIAGNOSIS — N200 Calculus of kidney: Secondary | ICD-10-CM | POA: Insufficient documentation

## 2010-11-04 HISTORY — DX: Calculus of kidney: N20.0

## 2010-11-04 LAB — URINALYSIS, ROUTINE W REFLEX MICROSCOPIC
Glucose, UA: NEGATIVE mg/dL
Specific Gravity, Urine: 1.025 (ref 1.005–1.030)
Urobilinogen, UA: 0.2 mg/dL (ref 0.0–1.0)
pH: 7 (ref 5.0–8.0)

## 2010-11-04 LAB — CBC
HCT: 37 % (ref 36.0–46.0)
MCHC: 33.2 g/dL (ref 30.0–36.0)
RDW: 13.3 % (ref 11.5–15.5)

## 2010-11-04 LAB — BASIC METABOLIC PANEL
Calcium: 9 mg/dL (ref 8.4–10.5)
Chloride: 108 mEq/L (ref 96–112)
Creatinine, Ser: 0.71 mg/dL (ref 0.50–1.10)
GFR calc Af Amer: >60 mL/min (ref >60)
GFR calc non Af Amer: >60 mL/min (ref >60)

## 2010-11-04 LAB — DIFFERENTIAL
Basophils Absolute: 0 10*3/uL (ref 0.0–0.1)
Basophils Relative: 0 % (ref 0–1)
Monocytes Absolute: 1.3 10*3/uL — ABNORMAL HIGH (ref 0.1–1.0)
Neutro Abs: 8.9 10*3/uL — ABNORMAL HIGH (ref 1.7–7.7)

## 2010-11-04 LAB — URINE MICROSCOPIC-ADD ON

## 2010-11-04 LAB — PREGNANCY, URINE: Preg Test, Ur: NEGATIVE

## 2010-11-04 MED ORDER — FENTANYL CITRATE 0.05 MG/ML IJ SOLN
100.0000 ug | Freq: Once | INTRAMUSCULAR | Status: AC
  Administered 2010-11-04: 100 ug via INTRAVENOUS
  Filled 2010-11-04: qty 2

## 2010-11-04 MED ORDER — ONDANSETRON HCL 4 MG/2ML IJ SOLN
4.0000 mg | Freq: Once | INTRAMUSCULAR | Status: AC
  Administered 2010-11-04: 4 mg via INTRAVENOUS
  Filled 2010-11-04: qty 2

## 2010-11-04 MED ORDER — KETOROLAC TROMETHAMINE 30 MG/ML IJ SOLN
30.0000 mg | Freq: Once | INTRAMUSCULAR | Status: DC
  Filled 2010-11-04: qty 1

## 2010-11-04 MED ORDER — CEFTRIAXONE SODIUM 1 G IJ SOLR
1.0000 g | INTRAMUSCULAR | Status: AC
  Administered 2010-11-04: 1 g via INTRAMUSCULAR
  Filled 2010-11-04: qty 1

## 2010-11-04 MED ORDER — KETOROLAC TROMETHAMINE 30 MG/ML IJ SOLN
30.0000 mg | Freq: Once | INTRAMUSCULAR | Status: AC
  Administered 2010-11-04: 30 mg via INTRAVENOUS

## 2010-11-04 MED ORDER — CEPHALEXIN 500 MG PO CAPS: 500.0000 mg | ORAL_CAPSULE | Freq: Two times a day (BID) | ORAL | Status: DC

## 2010-11-04 MED ORDER — LIDOCAINE HCL (PF) 1 % IJ SOLN
INTRAMUSCULAR | Status: AC
  Administered 2010-11-04: 2 mL
  Filled 2010-11-04: qty 5

## 2010-11-04 NOTE — ED Notes (Signed)
Spoke to [facility] at Dr. Rudean Haskell office and she reports Dr. Jerre Simon wants pt sent to his office after d/c from ED. MD Radford Pax made aware and is working on discharge paperwork.

## 2010-11-04 NOTE — ED Notes (Signed)
Pt sleeping. Pt woke up when vital signs were being checked. Pt stated she was in a lot of pain since she returned back from CT.NAD noted at this time.

## 2010-11-04 NOTE — ED Notes (Signed)
Pt c/o left flank pain since 4am. Hx of kidney stones. C/o nausea along with pain.

## 2010-11-04 NOTE — Progress Notes (Signed)
History     CSN: [id number] Arrival date & time: 11/04/2010 11:32 AM   Chief Complaint  Patient presents with  . Flank Pain      HPI Pt woke up at 4am with left sided pain, sharp in nature, feels similar to previous kidney stones. Nothing makes pain worse, it is intermittent, not sure what brings it on.  Tried Aleve, which dulled the pain but did not fully resolve it.  Was 8/10 when she came, increased to 10/10 while sitting, now 6/10 s/p fentanyl and zofran.  No fevers or chills. + nausea, no vomiting or diarrhea. No dysuria, urgency.  Past Medical History  Diagnosis Date  . Kidney stones      History reviewed. No pertinent past surgical history.  No family history on file.  History  Substance Use Topics  . Smoking status: Not on file  . Smokeless tobacco: Not on file  . Alcohol Use:     OB History    Grav Para Term Preterm Abortions TAB SAB Ect Mult Living                  Review of Systems  Constitutional: Negative for fever, chills, malaise/fatigue and diaphoresis.  HENT: Negative for congestion, sore throat and neck pain.   Respiratory: Negative for cough and wheezing.   Cardiovascular: Negative for chest pain and palpitations.  Gastrointestinal: Positive for nausea. Negative for heartburn, vomiting, abdominal pain, diarrhea and constipation.  Genitourinary: Positive for flank pain. Negative for dysuria, urgency, frequency and hematuria.  Musculoskeletal: Positive for back pain. Negative for myalgias.  Skin: Negative for rash.  Neurological: Negative for dizziness and headaches.  Hematological: Does not bruise/bleed easily.  Psychiatric/Behavioral: Negative for depression.   Review of Systems  Constitutional: Negative for fever, chills, malaise/fatigue and diaphoresis.  HENT: Negative for congestion, sore throat and neck pain.   Respiratory: Negative for cough and wheezing.   Cardiovascular: Negative for chest pain and palpitations.  Gastrointestinal:  Positive for nausea. Negative for heartburn, vomiting, abdominal pain, diarrhea and constipation.  Genitourinary: Positive for flank pain. Negative for dysuria, urgency, frequency and hematuria.  Musculoskeletal: Positive for back pain. Negative for myalgias.  Skin: Negative for rash.  Neurological: Negative for dizziness and headaches.  Endo/Heme/Allergies: Does not bruise/bleed easily.  Psychiatric/Behavioral: Negative for depression.    Allergies  Other  Home Medications   Current Outpatient Rx  Name Route Sig Dispense Refill  . DEPO-ESTRADIOL IM Intramuscular Inject into the muscle every 3 (three) months.      Marland Kitchen NAPROXEN SODIUM 220 MG PO TABS Oral Take 220 mg by mouth 2 (two) times daily.        Physical Exam    BP 113/55  Pulse 67  Temp(Src) 98.6 F (37 C) (Oral)  Resp 21  Ht 5\' 3"  (1.6 m)  Wt 150 lb (68.04 kg)  BMI 26.57 kg/m2  SpO2 98%  Physical Exam  Constitutional: She is oriented to person, place, and time. She appears well-developed and well-nourished. No distress.  HENT:  Head: Normocephalic and atraumatic.  Neck: Normal range of motion. Neck supple.  Cardiovascular: Normal rate and regular rhythm.   No murmur heard. Pulmonary/Chest: Effort normal and breath sounds normal. No respiratory distress.  Abdominal: Soft. Bowel sounds are normal. There is no tenderness. There is no CVA tenderness.  Musculoskeletal: Normal range of motion. She exhibits no edema.  Neurological: She is alert and oriented to person, place, and time.  Skin: Skin is warm and dry. She is  not diaphoretic.  Psychiatric: She has a normal mood and affect.    ED Course  Procedures  Results for orders placed during the hospital encounter of 11/04/10  URINALYSIS, ROUTINE W REFLEX MICROSCOPIC      Component Value Range   Color, Urine YELLOW  YELLOW    Appearance HAZY (*) CLEAR    Specific Gravity, Urine 1.025  1.005 - 1.030    pH 7.0  5.0 - 8.0    Glucose, UA NEGATIVE  NEGATIVE (mg/dL)     Hgb urine dipstick LARGE (*) NEGATIVE    Bilirubin Urine NEGATIVE  NEGATIVE    Ketones, ur TRACE (*) NEGATIVE (mg/dL)   Protein, ur 409 (*) NEGATIVE (mg/dL)   Urobilinogen, UA 0.2  0.0 - 1.0 (mg/dL)   Nitrite POSITIVE (*) NEGATIVE    Leukocytes, UA MODERATE (*) NEGATIVE   PREGNANCY, URINE      Component Value Range   Preg Test, Ur NEGATIVE    URINE MICROSCOPIC-ADD ON      Component Value Range   Squamous Epithelial / LPF FEW (*) RARE    WBC, UA TOO NUMEROUS TO COUNT  <3 (WBC/hpf)   RBC / HPF TOO NUMEROUS TO COUNT  <3 (RBC/hpf)   Bacteria, UA MANY (*) RARE   CBC      Component Value Range   WBC 13.9 (*) 4.0 - 10.5 (K/uL)   RBC 4.38  3.87 - 5.11 (MIL/uL)   Hemoglobin 12.3  12.0 - 15.0 (g/dL)   HCT 81.1  91.4 - 78.2 (%)   MCV 84.5  78.0 - 100.0 (fL)   MCH 28.1  26.0 - 34.0 (pg)   MCHC 33.2  30.0 - 36.0 (g/dL)   RDW 95.6  21.3 - 08.6 (%)   Platelets 294  150 - 400 (K/uL)  DIFFERENTIAL      Component Value Range   Neutrophils Relative 64  43 - 77 (%)   Neutro Abs 8.9 (*) 1.7 - 7.7 (K/uL)   Lymphocytes Relative 25  12 - 46 (%)   Lymphs Abs 3.5  0.7 - 4.0 (K/uL)   Monocytes Relative 9  3 - 12 (%)   Monocytes Absolute 1.3 (*) 0.1 - 1.0 (K/uL)   Eosinophils Relative 2  0 - 5 (%)   Eosinophils Absolute 0.2  0.0 - 0.7 (K/uL)   Basophils Relative 0  0 - 1 (%)   Basophils Absolute 0.0  0.0 - 0.1 (K/uL)  BASIC METABOLIC PANEL      Component Value Range   Sodium 138  135 - 145 (mEq/L)   Potassium 3.9  3.5 - 5.1 (mEq/L)   Chloride 108  96 - 112 (mEq/L)   CO2 23  19 - 32 (mEq/L)   Glucose, Bld 78  70 - 99 (mg/dL)   BUN 13  6 - 23 (mg/dL)   Creatinine, Ser 5.78  0.50 - 1.10 (mg/dL)   Calcium 9.0  8.4 - 46.9 (mg/dL)   GFR calc non Af Amer >60  >60 (mL/min)   GFR calc Af Amer >60  >60 (mL/min)      1. Flank pain   2. UTI (urinary tract infection)      MDM Concern for UTI with or w/o pyelo vs infected stone. UA c/w UTI, will obtain CT abd/pelvis to assess for  obstructing/infected stone Cr nml WBC elevated, also c/w infection   After CT was read, discussed the case with pt's urologist at [facility] who stated that this is a chronic  problem with the enlarged left kidney and that there is nothing acute to do for it.  They were in agreement with treating her UTI as an outpatient and emphasized multiple times that pt needs to go to [facility] for this problem, as they have explained to her multiple times in the office.  No need for inpatient admission per Urology or for other acute work up at this time.

## 2010-11-05 LAB — URINE CULTURE: Colony Count: 25000

## 2010-11-06 ENCOUNTER — Encounter (HOSPITAL_COMMUNITY): Payer: Self-pay | Admitting: *Deleted

## 2010-11-06 ENCOUNTER — Inpatient Hospital Stay (HOSPITAL_COMMUNITY)
Admission: EM | Admit: 2010-11-06 | Discharge: 2010-11-09 | DRG: 690 | Disposition: A | Discharge status: Home / Self Care | Payer: Medicaid Other | Attending: Family Medicine | Admitting: Family Medicine

## 2010-11-06 ENCOUNTER — Emergency Department (HOSPITAL_COMMUNITY): Payer: Medicaid Other

## 2010-11-06 DIAGNOSIS — N390 Urinary tract infection, site not specified: Secondary | ICD-10-CM | POA: Diagnosis present

## 2010-11-06 DIAGNOSIS — N12 Tubulo-interstitial nephritis, not specified as acute or chronic: Principal | ICD-10-CM | POA: Diagnosis present

## 2010-11-06 DIAGNOSIS — E876 Hypokalemia: Secondary | ICD-10-CM | POA: Diagnosis present

## 2010-11-06 DIAGNOSIS — N289 Disorder of kidney and ureter, unspecified: Secondary | ICD-10-CM | POA: Diagnosis present

## 2010-11-06 DIAGNOSIS — N23 Unspecified renal colic: Secondary | ICD-10-CM | POA: Diagnosis present

## 2010-11-06 DIAGNOSIS — N200 Calculus of kidney: Secondary | ICD-10-CM | POA: Diagnosis present

## 2010-11-06 DIAGNOSIS — E860 Dehydration: Secondary | ICD-10-CM | POA: Diagnosis present

## 2010-11-06 HISTORY — DX: Urinary tract infection, site not specified: N39.0

## 2010-11-06 LAB — COMPREHENSIVE METABOLIC PANEL
AST: 12 U/L (ref 0–37)
CO2: 22 mEq/L (ref 19–32)
Calcium: 9 mg/dL (ref 8.4–10.5)
Creatinine, Ser: 1.21 mg/dL — ABNORMAL HIGH (ref 0.50–1.10)
GFR calc Af Amer: >60 mL/min (ref >60)
GFR calc non Af Amer: 52 mL/min — ABNORMAL LOW (ref >60)
Sodium: 135 mEq/L (ref 135–145)
Total Protein: 7.2 g/dL (ref 6.0–8.3)

## 2010-11-06 LAB — URINE MICROSCOPIC-ADD ON

## 2010-11-06 LAB — CBC
MCH: 27.6 pg (ref 26.0–34.0)
MCHC: 32.9 g/dL (ref 30.0–36.0)
MCV: 83.9 fL (ref 78.0–100.0)
Platelets: 276 10*3/uL (ref 150–400)
RBC: 4.17 MIL/uL (ref 3.87–5.11)
RDW: 13.2 % (ref 11.5–15.5)

## 2010-11-06 LAB — URINALYSIS, ROUTINE W REFLEX MICROSCOPIC
Glucose, UA: NEGATIVE mg/dL
Specific Gravity, Urine: 1.015 (ref 1.005–1.030)
pH: 7 (ref 5.0–8.0)

## 2010-11-06 MED ORDER — DEXTROSE 5 % IV SOLN
1.0000 g | Freq: Once | INTRAVENOUS | Status: AC
  Administered 2010-11-06: 19:00:00 via INTRAVENOUS
  Filled 2010-11-06: qty 1

## 2010-11-06 MED ORDER — ONDANSETRON HCL 4 MG/2ML IJ SOLN
4.0000 mg | Freq: Once | INTRAMUSCULAR | Status: AC
  Administered 2010-11-06: 4 mg via INTRAVENOUS
  Filled 2010-11-06: qty 2

## 2010-11-06 MED ORDER — SODIUM CHLORIDE 0.9 % IV SOLN
INTRAVENOUS | Status: DC
  Administered 2010-11-08: 22:00:00 via INTRAVENOUS

## 2010-11-06 MED ORDER — HYDROMORPHONE HCL 1 MG/ML IJ SOLN
1.0000 mg | Freq: Once | INTRAMUSCULAR | Status: AC
  Administered 2010-11-06: 1 mg via INTRAVENOUS
  Filled 2010-11-06: qty 1

## 2010-11-06 MED ORDER — HYDROMORPHONE HCL 1 MG/ML IJ SOLN
INTRAMUSCULAR | Status: AC
  Administered 2010-11-06: 1 mg
  Filled 2010-11-06: qty 1

## 2010-11-06 MED ORDER — SODIUM CHLORIDE 0.9 % IV BOLUS (SEPSIS)
250.0000 mL | Freq: Once | INTRAVENOUS | Status: AC
  Administered 2010-11-06: 250 mL via INTRAVENOUS

## 2010-11-06 NOTE — ED Provider Notes (Addendum)
History   Scribed for Shelda Jakes, MD, the patient was seen in room APA06/APA06. This chart was scribed by Clarita Crane. This patient's care was started at 7:03PM.  CSN: [id number] Arrival date & time: 11/06/2010  6:12 PM  Chief Complaint  Patient presents with  . Flank Pain  . Urinary Tract Infection    HPI   HPI [name] is a 30 y.o. female who presents to the Emergency Department complaining of intermittent moderate to severe left flank pain onset several days ago and persistent since with associated nausea, vomiting and weakness. Patient reports she was evaluated in ED 2 days ago for left flank pain and was prescribed Keflex but states she has been unable to "keep any antibiotics down" because of persistent vomiting. Patient also notes she was evaluated by Dr. Jerre Simon 2 days ago after evaluation in ED earlier that day who told her that her CT-Abdomen showed 2 kidney stones and swelling of the left kidney. Patient with h/o kidney stones, UTI, appendectomy and kidney surgery.  Urologist- [facility] PCP- [facility]  HPI ELEMENTS: Location: left flank  Onset: several days ago Duration: persistent since onset  Timing: intermittent Severity: moderate to severe   Context:  as above  Associated symptoms:  +nausea, vomiting, weakness   PAST MEDICAL HISTORY:  Past Medical History  Diagnosis Date  . Kidney stones   . UTI (urinary tract infection)     PAST SURGICAL HISTORY:  Past Surgical History  Procedure Date  . Appendectomy   . Kidney surgery     FAMILY HISTORY:  History reviewed. No pertinent family history.   SOCIAL HISTORY: History   Social History  . Marital Status: Single    Spouse Name: N/A    Number of Children: N/A  . Years of Education: N/A   Social History Main Topics  . Smoking status: Former Games developer  . Smokeless tobacco: None  . Alcohol Use: No  . Drug Use: No  . Sexually Active:    Other Topics Concern  . None   Social History Narrative  .  None    Review of Systems  Review of Systems  Constitutional: Negative for fever.  HENT: Negative for rhinorrhea.   Eyes: Negative for pain.  Respiratory: Negative for cough and shortness of breath.   Cardiovascular: Negative for chest pain.  Gastrointestinal: Positive for nausea and vomiting. Negative for abdominal pain and diarrhea.  Genitourinary: Positive for flank pain. Negative for dysuria.  Musculoskeletal: Negative for back pain.  Skin: Negative for rash.  Neurological: Positive for weakness. Negative for headaches.    Allergies  Other  Home Medications   Current Outpatient Rx  Name Route Sig Dispense Refill  . CEPHALEXIN 500 MG PO CAPS Oral Take 1 capsule (500 mg total) by mouth 2 (two) times daily. 14 capsule 0  . NAPROXEN SODIUM 220 MG PO TABS Oral Take 220 mg by mouth 2 (two) times daily.      . OXYCODONE-ACETAMINOPHEN 5-325 MG PO TABS Oral Take 1 tablet by mouth daily as needed. For pain     . DEPO-ESTRADIOL IM Intramuscular Inject into the muscle every 3 (three) months.        Physical Exam    BP 127/67  Pulse 98  Temp(Src) 100.5 F (38.1 C) (Oral)  Resp 17  Ht 5\' 3"  (1.6 m)  Wt 150 lb (68.04 kg)  BMI 26.57 kg/m2  SpO2 99%  Physical Exam  Nursing note and vitals reviewed. Constitutional: She is oriented to  person, place, and time. She appears well-developed and well-nourished.       Uncomfortable appearing.   HENT:  Head: Normocephalic and atraumatic.  Eyes: EOM are normal. Pupils are equal, round, and reactive to light.  Neck: Neck supple.  Cardiovascular: Normal rate and regular rhythm.  Exam reveals no gallop and no friction rub.   No murmur heard. Pulmonary/Chest: Effort normal and breath sounds normal. She has no wheezes.  Abdominal: Soft. Bowel sounds are normal. She exhibits no distension. There is no tenderness.  Musculoskeletal: Normal range of motion.  Neurological: She is alert and oriented to person, place, and time. No sensory deficit.   Skin: Skin is warm and dry.  Psychiatric: Her behavior is normal.    ED Course  Procedures  OTHER DATA REVIEWED: Nursing notes, vital signs, and past medical records reviewed. Lab results reviewed and considered Imaging results reviewed and considered  Ct Abdomen Pelvis Wo Contrast 11/04/2010  *RADIOLOGY REPORT*  Clinical Data: Right flank pain, history kidney stones  CT ABDOMEN AND PELVIS WITHOUT CONTRAST  Technique:  Multidetector CT imaging of the abdomen and pelvis was performed following the standard protocol without intravenous contrast. Sagittal and coronal MPR images reconstructed from axial data set.  Comparison: 06/27/2010  Findings: Minimal atelectasis medial right lung base. Numerous calculi in both kidneys. Largest calculus right kidney is at inferior pole 4 mm diameter image 37. Largest calculus in the left kidney measures 1.5 cm diameter lower pole image 37 with an additional 1.2 cm diameter calculus at the left renal pelvis. No right hydronephrosis, ureteral calculus, or ureteral dilatation identified. Numerous large areas of low attenuation are seen within the left kidney, with left kidney appearing enlarged with associated mild surrounding infiltration of pararenal fat, slightly increased since previous study, could represent chronic obstruction or xanthogranulomatous pyelonephritis. Left ureter is dilated to the urinary bladder without visualization of ureteral calculus. Bladder is normal appearance without wall thickening or stone.  Within limits of a nonenhanced exam, no focal abnormalities of the liver, spleen, pancreas, or adrenal glands. Numerous normal and upper normal-sized left periaortic lymph nodes, increased versus previous exam. Small umbilical hernia containing fat. Unremarkable uterus and adnexae. Appendix not identified, with note of surgical clips adjacent to tip of cecum, question prior appendectomy. Bowel loops suboptimally assessed due to lack of GI contrast, no  gross abnormality identified. Normal-appearing stomach. No mass, adenopathy, free fluid, or additional inflammatory process. No acute osseous findings.  IMPRESSION: Numerous bilateral renal calculi, larger on the left. No definite evidence of right ureteral calculus or urinary obstruction. Numerous fluid attenuation foci throughout the enlarged left kidney, question cysts or chronic obstruction or hydronephrosis but cannot exclude xanthogranulomatous pyelonephritis.  Original Report Authenticated By: Lollie Marrow, M.D.   DIAGNOSTIC STUDIES: Oxygen Saturation is 100% on room air, normal by my interpretation.    LABS / RADIOLOGY: Results for orders placed during the hospital encounter of 11/06/10  URINALYSIS, ROUTINE W REFLEX MICROSCOPIC      Component Value Range   Color, Urine YELLOW  YELLOW    Appearance HAZY (*) CLEAR    Specific Gravity, Urine 1.015  1.005 - 1.030    pH 7.0  5.0 - 8.0    Glucose, UA NEGATIVE  NEGATIVE (mg/dL)   Hgb urine dipstick SMALL (*) NEGATIVE    Bilirubin Urine NEGATIVE  NEGATIVE    Ketones, ur TRACE (*) NEGATIVE (mg/dL)   Protein, ur 30 (*) NEGATIVE (mg/dL)   Urobilinogen, UA 0.2  0.0 - 1.0 (mg/dL)  Nitrite NEGATIVE  NEGATIVE    Leukocytes, UA MODERATE (*) NEGATIVE   CBC      Component Value Range   WBC 15.0 (*) 4.0 - 10.5 (K/uL)   RBC 4.17  3.87 - 5.11 (MIL/uL)   Hemoglobin 11.5 (*) 12.0 - 15.0 (g/dL)   HCT 11.9 (*) 14.7 - 46.0 (%)   MCV 83.9  78.0 - 100.0 (fL)   MCH 27.6  26.0 - 34.0 (pg)   MCHC 32.9  30.0 - 36.0 (g/dL)   RDW 82.9  56.2 - 13.0 (%)   Platelets 276  150 - 400 (K/uL)  COMPREHENSIVE METABOLIC PANEL      Component Value Range   Sodium 135  135 - 145 (mEq/L)   Potassium 3.3 (*) 3.5 - 5.1 (mEq/L)   Chloride 103  96 - 112 (mEq/L)   CO2 22  19 - 32 (mEq/L)   Glucose, Bld 91  70 - 99 (mg/dL)   BUN 8  6 - 23 (mg/dL)   Creatinine, Ser 8.65 (*) 0.50 - 1.10 (mg/dL)   Calcium 9.0  8.4 - 78.4 (mg/dL)   Total Protein 7.2  6.0 - 8.3 (g/dL)    Albumin 3.4 (*) 3.5 - 5.2 (g/dL)   AST 12  0 - 37 (U/L)   ALT 17  0 - 35 (U/L)   Alkaline Phosphatase 84  39 - 117 (U/L)   Total Bilirubin 0.8  0.3 - 1.2 (mg/dL)   GFR calc non Af Amer 52 (*) >60 (mL/min)   GFR calc Af Amer >60  >60 (mL/min)  LIPASE, BLOOD      Component Value Range   Lipase 16  11 - 59 (U/L)  URINE MICROSCOPIC-ADD ON      Component Value Range   Squamous Epithelial / LPF FEW (*) RARE    WBC, UA TOO NUMEROUS TO COUNT  <3 (WBC/hpf)   RBC / HPF 3-6  <3 (RBC/hpf)   Bacteria, UA FEW (*) RARE    Ct Abdomen Pelvis Wo Contrast  11/06/2010  *RADIOLOGY REPORT*  Clinical Data: Left flank pain, UTI, history of appendectomy  CT ABDOMEN AND PELVIS WITHOUT CONTRAST  Technique:  Multidetector CT imaging of the abdomen and pelvis was performed following the standard protocol without intravenous contrast.  Comparison: 11/04/2010  Findings: Lung bases are essentially clear.  Unenhanced liver, spleen, pancreas, and adrenal glands within normal limits.  Gallbladder unremarkable.  No intrahepatic or hepatic ductal dilatation.  Multiple nonobstructing right renal calculi, measuring up to 5 mm in the right lower pole (series 2/image 35).  No hydronephrosis.  Multiple left renal calculi, including a 14 mm calculus in the left lower pole (series 2/image 35) and a 12 mm calculus in the proximal collecting system (series 2/image 31).  Again seen are numerous low- density lesions in the left kidney, favoring chronic hydronephrosis, although there is mildly increased renal edema and surrounding perinephric stranding on the current study, suggesting an acute exacerbation.  Xanthogranulomatous pyelonephritis remains possible, but is considered less likely given in the slowly developing appearance over several months.  No evidence of bowel obstruction.  No evidence of abdominal aortic aneurysm.  No abdominopelvic ascites.  No suspicious abdominopelvic lymphadenopathy.  Stable small left para-aortic lymph nodes.   Uterus and bilateral ovaries are unremarkable.  No ureteral or bladder calculi.  Bladder is underdistended.  IMPRESSION: Multiple left renal calculi, including a 12 mm calculus in the proximal collecting system.  Multiple low density renal lesions, favored to reflect hydronephrosis due to  acute exacerbation of chronic obstruction.  Xanthogranulomatous pyelonephritis remains possible, but is considered less likely.  Multiple nonobstructing right renal calculi.  No hydronephrosis.  No ureteral or bladder calculi.  Original Report Authenticated By: Charline Bills, M.D.  .   ED COURSE / COORDINATION OF CARE: Orders Placed This Encounter  Procedures  . CT Abdomen Pelvis Wo Contrast  . Urinalysis, Routine w reflex microscopic  . CBC  . Comprehensive metabolic panel  . Lipase, blood  . Urine microscopic-add on  . Consult to urology  . Consult to hospitalist  11:54PM- Consult Complete with Dr. Orvan Falconer in person. Patient case explained and discussed. Dr. Orvan Falconer agrees to evaluate patient further.   MDM: Differential Diagnosis: PYELONEPHRITIS. IN ED RX WITH ROCEPHIN     PLAN: Transfer to [facility]/Admit to Hospital. HOSPITALIST WILL ADMIT HERE AND CONSULT WITH JAV[facility] IN THE MORNING MAY NEED TRANSFER TO [facility] FOR NON URETERAL RENAL STONE.   CONDITION ON DISCHARGE: Stable.  DIAGNOSIS: PYELONEPHRITIS  MEDICATIONS GIVEN IN THE E.D.  Medications  oxyCODONE-acetaminophen (PERCOCET) 5-325 MG per tablet (not administered)  0.9 %  sodium chloride infusion (not administered)  sodium chloride 0.9 % bolus 250 mL (not administered)  HYDROmorphone (DILAUDID) injection 1 mg (1 mg Intravenous Given 11/06/10 1924)  ondansetron (ZOFRAN) injection 4 mg (4 mg Intravenous Given 11/06/10 1923)  cefTRIAXone (ROCEPHIN) 1 g in dextrose 5 % 50 mL IVPB (  Intravenous New Bag 11/06/10 1927)  HYDROmorphone (DILAUDID) 1 MG/ML injection (1 mg  Given 11/06/10 2154)      I personally performed the  services described in this documentation, which was scribed in my presence. The recorded information has been reviewed and considered. Shelda Jakes, MD         Shelda Jakes, MD 11/07/10 0454  Shelda Jakes, MD 11/07/10 [phone]

## 2010-11-06 NOTE — ED Notes (Signed)
Pt c/o left sided flank pain. Pt has vomited since in dept. Pt states she was seen in ed yesterday and dx with 2 kidney stones, uti, and kidney infection.

## 2010-11-07 ENCOUNTER — Encounter (HOSPITAL_COMMUNITY): Payer: Self-pay

## 2010-11-07 DIAGNOSIS — N12 Tubulo-interstitial nephritis, not specified as acute or chronic: Secondary | ICD-10-CM | POA: Diagnosis present

## 2010-11-07 DIAGNOSIS — E860 Dehydration: Secondary | ICD-10-CM | POA: Diagnosis present

## 2010-11-07 DIAGNOSIS — N23 Unspecified renal colic: Secondary | ICD-10-CM | POA: Diagnosis present

## 2010-11-07 DIAGNOSIS — E876 Hypokalemia: Secondary | ICD-10-CM | POA: Diagnosis present

## 2010-11-07 LAB — BASIC METABOLIC PANEL
BUN: 6 mg/dL (ref 6–23)
Calcium: 8.8 mg/dL (ref 8.4–10.5)
GFR calc Af Amer: 59 mL/min — ABNORMAL LOW (ref >60)
GFR calc non Af Amer: 49 mL/min — ABNORMAL LOW (ref >60)
Potassium: 3.9 mEq/L (ref 3.5–5.1)
Sodium: 137 mEq/L (ref 135–145)

## 2010-11-07 LAB — CBC
Hemoglobin: 11.3 g/dL — ABNORMAL LOW (ref 12.0–15.0)
MCH: 27.5 pg (ref 26.0–34.0)
MCHC: 32.6 g/dL (ref 30.0–36.0)
Platelets: 300 10*3/uL (ref 150–400)

## 2010-11-07 MED ORDER — ONDANSETRON HCL 4 MG PO TABS: 4.0000 mg | ORAL_TABLET | Freq: Four times a day (QID) | ORAL | Status: DC | PRN

## 2010-11-07 MED ORDER — MORPHINE SULFATE 2 MG/ML IJ SOLN
2.0000 mg | INTRAMUSCULAR | Status: DC | PRN
  Administered 2010-11-07 (×2): 4 mg via INTRAVENOUS
  Administered 2010-11-07 – 2010-11-08 (×3): 2 mg via INTRAVENOUS
  Administered 2010-11-08: 4 mg via INTRAVENOUS
  Administered 2010-11-09: 2 mg via INTRAVENOUS
  Filled 2010-11-07 (×3): qty 1
  Filled 2010-11-07 (×2): qty 2
  Filled 2010-11-07: qty 1
  Filled 2010-11-07 (×2): qty 2

## 2010-11-07 MED ORDER — SODIUM CHLORIDE 0.9 % IV BOLUS (SEPSIS)
1000.0000 mL | INTRAVENOUS | Status: AC
  Administered 2010-11-07: 1000 mL via INTRAVENOUS

## 2010-11-07 MED ORDER — PROMETHAZINE HCL 25 MG/ML IJ SOLN
12.5000 mg | Freq: Four times a day (QID) | INTRAMUSCULAR | Status: DC | PRN
  Filled 2010-11-07: qty 1

## 2010-11-07 MED ORDER — TRAZODONE HCL 50 MG PO TABS
25.0000 mg | ORAL_TABLET | Freq: Every evening | ORAL | Status: DC | PRN
  Administered 2010-11-07: 25 mg via ORAL
  Filled 2010-11-07: qty 1

## 2010-11-07 MED ORDER — POTASSIUM CHLORIDE IN NACL 40-0.9 MEQ/L-% IV SOLN
INTRAVENOUS | Status: AC
  Filled 2010-11-07: qty 1000

## 2010-11-07 MED ORDER — SODIUM CHLORIDE 0.9 % IV SOLN: INTRAVENOUS | Status: DC

## 2010-11-07 MED ORDER — ACETAMINOPHEN 650 MG RE SUPP: 650.0000 mg | Freq: Four times a day (QID) | RECTAL | Status: DC | PRN

## 2010-11-07 MED ORDER — ACETAMINOPHEN 325 MG PO TABS
650.0000 mg | ORAL_TABLET | Freq: Four times a day (QID) | ORAL | Status: DC | PRN
  Administered 2010-11-07: 650 mg via ORAL
  Filled 2010-11-07: qty 2

## 2010-11-07 MED ORDER — ONDANSETRON HCL 4 MG/2ML IJ SOLN: 4.0000 mg | Freq: Four times a day (QID) | INTRAMUSCULAR | Status: DC | PRN

## 2010-11-07 MED ORDER — DEXTROSE 5 % IV SOLN
INTRAVENOUS | Status: AC
  Filled 2010-11-07: qty 1

## 2010-11-07 MED ORDER — FLEET ENEMA 7-19 GM/118ML RE ENEM: 1.0000 | ENEMA | RECTAL | Status: DC | PRN

## 2010-11-07 MED ORDER — POTASSIUM CHLORIDE IN NACL 40-0.9 MEQ/L-% IV SOLN
INTRAVENOUS | Status: DC
  Administered 2010-11-07 (×2): via INTRAVENOUS
  Administered 2010-11-07: 150 mL/h via INTRAVENOUS
  Administered 2010-11-08: 05:00:00 via INTRAVENOUS
  Filled 2010-11-07 (×13): qty 1000

## 2010-11-07 MED ORDER — SODIUM CHLORIDE 0.9 % IJ SOLN
INTRAMUSCULAR | Status: AC
  Administered 2010-11-07: 09:00:00
  Filled 2010-11-07: qty 10

## 2010-11-07 MED ORDER — PROMETHAZINE HCL 12.5 MG PO TABS
12.5000 mg | ORAL_TABLET | Freq: Four times a day (QID) | ORAL | Status: DC | PRN
  Administered 2010-11-07 (×2): 12.5 mg via ORAL
  Filled 2010-11-07: qty 1

## 2010-11-07 MED ORDER — POLYETHYLENE GLYCOL 3350 17 G PO PACK: 17.0000 g | PACK | Freq: Every day | ORAL | Status: DC | PRN

## 2010-11-07 MED ORDER — CEFTAZIDIME 1 G IJ SOLR: 1.0000 g | Freq: Three times a day (TID) | INTRAMUSCULAR | Status: DC

## 2010-11-07 MED ORDER — ENOXAPARIN SODIUM 40 MG/0.4ML ~~LOC~~ SOLN
40.0000 mg | Freq: Every day | SUBCUTANEOUS | Status: DC
  Administered 2010-11-07 – 2010-11-09 (×4): 40 mg via SUBCUTANEOUS
  Filled 2010-11-07 (×3): qty 0.4

## 2010-11-07 MED ORDER — BISACODYL 10 MG RE SUPP: 10.0000 mg | RECTAL | Status: DC | PRN

## 2010-11-07 MED ORDER — DEXTROSE 5 % IV SOLN
1.0000 g | Freq: Three times a day (TID) | INTRAVENOUS | Status: DC
  Administered 2010-11-07 – 2010-11-09 (×6): 1 g via INTRAVENOUS
  Filled 2010-11-07 (×11): qty 1

## 2010-11-07 NOTE — Consults (Signed)
Report #[id number]#[id number]

## 2010-11-07 NOTE — H&P (Signed)
PCP:   Criss Rosales, MD   Chief Complaint:  Flank pain and vomiting x 2 days  HPI 30 year old African  American lady with history of recurrent kidney stones, status post lithotripsy, status post J stent for stone removal, was seen in the emergency room 2 days ago for flank pain send urine tract infection, given a prescription for Keflex and followup with Dr. Renda Rolls the same day, she was given a prescription for Percocet, and so arrangements were made for her to be seen at [facility] to deal with her multiple kidney stones.  Patient says that since then she has had persistent vomiting more than 10 times per day, unable to keep down anything at all even water, vomiting up thick mucus when her stomach is empty, and having severe pain in her left flank radiating to her left hip. Patient eventually returned to the emergency room today for reevaluation, is found to be severely dehydrated, peak CT scan shows multiple large stones, no hydronephrosis, xanthomatous pyelonephritis. Hospitalist service was called for assistance.   Review of Systems:  The patient denies anorexia, fever, weight loss,, vision loss, decreased hearing, hoarseness, chest pain, syncope, dyspnea on exertion, peripheral edema, balance deficits, hemoptysis, abdominal pain, melena, hematochezia, severe indigestion/heartburn, hematuria, incontinence, genital sores, muscle weakness, suspicious skin lesions, transient blindness, difficulty walking, depression, unusual weight change, abnormal bleeding, enlarged lymph nodes, angioedema, and breast masses.  Past Medical History: Past Medical History  Diagnosis Date  . Kidney stones   . UTI (urinary tract infection)    Past Surgical History  Procedure Date  . Appendectomy   . Kidney surgery     Medications: Prior to Admission medications   Medication Sig Start Date End Date Taking? Authorizing Provider  cephALEXin (KEFLEX) 500 MG capsule Take 1 capsule (500 mg total) by mouth 2 (two)  times daily. 11/04/10 11/14/10 Yes Jacquelyn McGill  naproxen sodium (ALEVE) 220 MG tablet Take 220 mg by mouth 2 (two) times daily.     Yes Historical Provider, MD  oxyCODONE-acetaminophen (PERCOCET) 5-325 MG per tablet Take 1 tablet by mouth daily as needed. For pain    Yes Historical Provider, MD  Estradiol Cypionate (DEPO-ESTRADIOL IM) Inject into the muscle every 3 (three) months.      Historical Provider, MD    Allergies:   Allergies  Allergen Reactions  . Other Other (See Comments)    Latex band-aids cause bruising.     Social History:  reports that she has quit smoking. She does not have any smokeless tobacco history on file. She reports that she does not drink alcohol or use illicit drugs.  Family History: History reviewed. No pertinent family history. denies significant family history.  Physical Exam: Filed Vitals:   11/06/10 1739 11/06/10 2248  BP: 133/79 127/67  Pulse: 108 98  Temp: 100.5 F (38.1 C)   TempSrc: Oral   Resp: 20 17  Height: 5\' 3"  (1.6 m)   Weight: 68.04 kg (150 lb)   SpO2: 100% 99%   General appearance: alert, cooperative, no distress and dehydrated Head: Normocephalic, without obvious abnormality, atraumatic Eyes: conjunctivae/corneas clear. PERL, EOM's intact. Nose: Nares normal. Septum midline. Mucosa normal. No drainage or sinus tenderness. Throat: lips, mucosa, and tongue dry, normal; Neck: no adenopathy, no carotid bruit, no JVD, supple, symmetrical, trachea midline and thyroid not enlarged, symmetric, no tenderness/mass/nodules Back: symmetric, no curvature. ROM normal. Left CVA tenderness. Resp: clear to auscultation bilaterally Chest wall: no tenderness Cardio: regular rate and rhythm, S1, S2 normal, no murmur,  click, rub or gallop GI: soft, non-tender; bowel sounds normal; no masses,  no organomegaly Pulses: 2+ and symmetric Skin: Skin color, texture, turgor normal. No rashes or lesions Neurologic: Grossly normal   Labs on Admission:    [facility] 11/06/10 1950 11/04/10 1215  NA 135 138  K 3.3* 3.9  CL 103 108  CO2 22 23  GLUCOSE 91 78  BUN 8 13  CREATININE 1.21* 0.71  CALCIUM 9.0 9.0  MG -- --  PHOS -- --    Basename 11/06/10 1950  AST 12  ALT 17  ALKPHOS 84  BILITOT 0.8  PROT 7.2  ALBUMIN 3.4*    Basename 11/06/10 1950  LIPASE 16  AMYLASE --    Basename 11/06/10 1950 11/04/10 1215  WBC 15.0* 13.9*  NEUTROABS -- 8.9*  HGB 11.5* 12.3  HCT 35.0* 37.0  MCV 83.9 84.5  PLT 276 294   No results found for this basename: CKTOTAL:3,CKMB:3,CKMBINDEX:3,TROPONINI:3 in the last 72 hours No results found for this basename: TSH,T4TOTAL,FREET3,T3FREE,THYROIDAB in the last 72 hours No results found for this basename: VITAMINB12:2,FOLATE:2,FERRITIN:2,TIBC:2,IRON:2,RETICCTPCT:2 in the last 72 hours  Radiological Exams on Admission: Ct Abdomen Pelvis Wo Contrast  11/06/2010  *RADIOLOGY REPORT*  Clinical Data: Left flank pain, UTI, history of appendectomy  CT ABDOMEN AND PELVIS WITHOUT CONTRAST  Technique:  Multidetector CT imaging of the abdomen and pelvis was performed following the standard protocol without intravenous contrast.  Comparison: 11/04/2010  Findings: Lung bases are essentially clear.  Unenhanced liver, spleen, pancreas, and adrenal glands within normal limits.  Gallbladder unremarkable.  No intrahepatic or hepatic ductal dilatation.  Multiple nonobstructing right renal calculi, measuring up to 5 mm in the right lower pole (series 2/image 35).  No hydronephrosis.  Multiple left renal calculi, including a 14 mm calculus in the left lower pole (series 2/image 35) and a 12 mm calculus in the proximal collecting system (series 2/image 31).  Again seen are numerous low- density lesions in the left kidney, favoring chronic hydronephrosis, although there is mildly increased renal edema and surrounding perinephric stranding on the current study, suggesting an acute exacerbation.  Xanthogranulomatous pyelonephritis  remains possible, but is considered less likely given in the slowly developing appearance over several months.  No evidence of bowel obstruction.  No evidence of abdominal aortic aneurysm.  No abdominopelvic ascites.  No suspicious abdominopelvic lymphadenopathy.  Stable small left para-aortic lymph nodes.  Uterus and bilateral ovaries are unremarkable.  No ureteral or bladder calculi.  Bladder is underdistended.  IMPRESSION: Multiple left renal calculi, including a 12 mm calculus in the proximal collecting system.  Multiple low density renal lesions, favored to reflect hydronephrosis due to acute exacerbation of chronic obstruction.  Xanthogranulomatous pyelonephritis remains possible, but is considered less likely.  Multiple nonobstructing right renal calculi.  No hydronephrosis.  No ureteral or bladder calculi.  Original Report Authenticated By: Charline Bills, M.D.   Ct Abdomen Pelvis Wo Contrast  11/04/2010  *RADIOLOGY REPORT*  Clinical Data: Right flank pain, history kidney stones  CT ABDOMEN AND PELVIS WITHOUT CONTRAST  Technique:  Multidetector CT imaging of the abdomen and pelvis was performed following the standard protocol without intravenous contrast. Sagittal and coronal MPR images reconstructed from axial data set.  Comparison: 06/27/2010  Findings: Minimal atelectasis medial right lung base. Numerous calculi in both kidneys. Largest calculus right kidney is at inferior pole 4 mm diameter image 37. Largest calculus in the left kidney measures 1.5 cm diameter lower pole image 37 with an additional 1.2 cm diameter  calculus at the left renal pelvis. No right hydronephrosis, ureteral calculus, or ureteral dilatation identified. Numerous large areas of low attenuation are seen within the left kidney, with left kidney appearing enlarged with associated mild surrounding infiltration of pararenal fat, slightly increased since previous study, could represent chronic obstruction or xanthogranulomatous  pyelonephritis. Left ureter is dilated to the urinary bladder without visualization of ureteral calculus. Bladder is normal appearance without wall thickening or stone.  Within limits of a nonenhanced exam, no focal abnormalities of the liver, spleen, pancreas, or adrenal glands. Numerous normal and upper normal-sized left periaortic lymph nodes, increased versus previous exam. Small umbilical hernia containing fat. Unremarkable uterus and adnexae. Appendix not identified, with note of surgical clips adjacent to tip of cecum, question prior appendectomy. Bowel loops suboptimally assessed due to lack of GI contrast, no gross abnormality identified. Normal-appearing stomach. No mass, adenopathy, free fluid, or additional inflammatory process. No acute osseous findings.  IMPRESSION: Numerous bilateral renal calculi, larger on the left. No definite evidence of right ureteral calculus or urinary obstruction. Numerous fluid attenuation foci throughout the enlarged left kidney, question cysts or chronic obstruction or hydronephrosis but cannot exclude xanthogranulomatous pyelonephritis.  Original Report Authenticated By: Lollie Marrow, M.D.    Assessment/Plan Present on Admission:  .UTI (urinary tract infection) .Renal colic on left side .Pyelonephritis .Dehydration .Hypokalemia  Will admit this lady for intravenous antibiotic therapy, IV fluid hydration, pain control, completion of her potassium, and we'll have her seen by her urologist in the morning, for consideration of transfer to [facility] for management of her renal stones.\  Other plans as per orders.  Estephan Gallardo 11/07/2010, 1:09 AM

## 2010-11-07 NOTE — Progress Notes (Signed)
  Pt admitted after midnight. Denies any pain at this time. Comfortable sleeping.  Admitted for nausea and vomiting , found to have pyelonephritis with ct abd and pelvis showing features of possible hydronephrosis. She started on IV fluids IV antibiotics. Urology consult to was called awaiting urology recommendations  and  the need for possible transfer to [facility] for evaluation of  her multiple renal calculi.

## 2010-11-08 LAB — BASIC METABOLIC PANEL
BUN: 6 mg/dL (ref 6–23)
Calcium: 9 mg/dL (ref 8.4–10.5)
Creatinine, Ser: 1.2 mg/dL — ABNORMAL HIGH (ref 0.50–1.10)
GFR calc non Af Amer: 53 mL/min — ABNORMAL LOW (ref >60)
Glucose, Bld: 94 mg/dL (ref 70–99)

## 2010-11-08 LAB — CBC
HCT: 33.7 % — ABNORMAL LOW (ref 36.0–46.0)
Hemoglobin: 11 g/dL — ABNORMAL LOW (ref 12.0–15.0)
MCH: 27.7 pg (ref 26.0–34.0)
MCHC: 32.6 g/dL (ref 30.0–36.0)
WBC: 11.6 10*3/uL — ABNORMAL HIGH (ref 4.0–10.5)

## 2010-11-08 NOTE — Progress Notes (Signed)
Subjective: Pt denies flank pain, nausea and vomiting. Had a fever of 102 last night.  Objective: Vital signs in last 24 hours: Filed Vitals:   11/07/10 0534 11/07/10 0640 11/07/10 2200 11/08/10 0600  BP: 105/64  121/66 107/67  Pulse: 94  109 86  Temp: 98.8 F (37.1 C)  102 F (38.9 C) 98.6 F (37 C)  TempSrc: Oral  Oral Oral  Resp: 16  20 16   Height:  5\' 3"  (1.6 m)    Weight:  69.6 kg (153 lb 7 oz)  69.854 kg (154 lb)  SpO2: 96%  98% 99%   Weight change: 1.814 kg (4 lb)  Intake/Output Summary (Last 24 hours) at 11/08/10 1604 Last data filed at 11/07/10 1700  Gross per 24 hour  Intake    360 ml  Output      0 ml  Net    360 ml     General Appearance:    Alert, cooperative, no distress, appears stated age  Lungs:     Clear to auscultation bilaterally, respirations unlabored   Heart:    Regular rate and rhythm, S1 and S2 normal, no murmur, rub   or gallop  Abdomen:     Soft, non-tender, bowel sounds active all four quadrants,    no masses, no organomegaly  Extremities:   Extremities normal, atraumatic, no cyanosis or edema  Pulses:   2+ and symmetric all extremities  Skin:   Skin color, texture, turgor normal, no rashes or lesions  Neurologic:   CNII-XII intact, normal strength, sensation and reflexes    throughout     Lab Results: Results for orders placed during the hospital encounter of 11/06/10 (from the past 24 hour(s))  CBC     Status: Abnormal   Collection Time   11/08/10  5:30 AM      Component Value Range   WBC 11.6 (*) 4.0 - 10.5 (K/uL)   RBC 3.97  3.87 - 5.11 (MIL/uL)   Hemoglobin 11.0 (*) 12.0 - 15.0 (g/dL)   HCT 16.1 (*) 09.6 - 46.0 (%)   MCV 84.9  78.0 - 100.0 (fL)   MCH 27.7  26.0 - 34.0 (pg)   MCHC 32.6  30.0 - 36.0 (g/dL)   RDW 04.5  40.9 - 81.1 (%)   Platelets 300  150 - 400 (K/uL)  BASIC METABOLIC PANEL     Status: Abnormal   Collection Time   11/08/10  5:30 AM      Component Value Range   Sodium 137  135 - 145 (mEq/L)   Potassium 5.0   3.5 - 5.1 (mEq/L)   Chloride 109  96 - 112 (mEq/L)   CO2 25  19 - 32 (mEq/L)   Glucose, Bld 94  70 - 99 (mg/dL)   BUN 6  6 - 23 (mg/dL)   Creatinine, Ser 9.14 (*) 0.50 - 1.10 (mg/dL)   Calcium 9.0  8.4 - 78.2 (mg/dL)   GFR calc non Af Amer 53 (*) >60 (mL/min)   GFR calc Af Amer >60  >60 (mL/min)   Micro Results: Recent Results (from the past 240 hour(s))  URINE CULTURE     Status: Normal   Collection Time   11/04/10 11:40 AM      Component Value Range Status Comment   Specimen Description URINE, CLEAN CATCH   Final    Special Requests NONE   Final    Setup Time [id number]   Final    Colony Count 25,000 COLONIES/ML  Final    Culture     Final    Value: Multiple bacterial morphotypes present, none predominant. Suggest appropriate recollection if clinically indicated.   Report Status 11/05/2010 FINAL   Final    Studies/Results: Ct Abdomen Pelvis Wo Contrast  11/06/2010  *RADIOLOGY REPORT*  Clinical Data: Left flank pain, UTI, history of appendectomy  CT ABDOMEN AND PELVIS WITHOUT CONTRAST  Technique:  Multidetector CT imaging of the abdomen and pelvis was performed following the standard protocol without intravenous contrast.  Comparison: 11/04/2010  Findings: Lung bases are essentially clear.  Unenhanced liver, spleen, pancreas, and adrenal glands within normal limits.  Gallbladder unremarkable.  No intrahepatic or hepatic ductal dilatation.  Multiple nonobstructing right renal calculi, measuring up to 5 mm in the right lower pole (series 2/image 35).  No hydronephrosis.  Multiple left renal calculi, including a 14 mm calculus in the left lower pole (series 2/image 35) and a 12 mm calculus in the proximal collecting system (series 2/image 31).  Again seen are numerous low- density lesions in the left kidney, favoring chronic hydronephrosis, although there is mildly increased renal edema and surrounding perinephric stranding on the current study, suggesting an acute exacerbation.   Xanthogranulomatous pyelonephritis remains possible, but is considered less likely given in the slowly developing appearance over several months.  No evidence of bowel obstruction.  No evidence of abdominal aortic aneurysm.  No abdominopelvic ascites.  No suspicious abdominopelvic lymphadenopathy.  Stable small left para-aortic lymph nodes.  Uterus and bilateral ovaries are unremarkable.  No ureteral or bladder calculi.  Bladder is underdistended.  IMPRESSION: Multiple left renal calculi, including a 12 mm calculus in the proximal collecting system.  Multiple low density renal lesions, favored to reflect hydronephrosis due to acute exacerbation of chronic obstruction.  Xanthogranulomatous pyelonephritis remains possible, but is considered less likely.  Multiple nonobstructing right renal calculi.  No hydronephrosis.  No ureteral or bladder calculi.  Original Report Authenticated By: Charline Bills, M.D.   Medications: Scheduled Meds:   . cefTAZidime (FORTAZ) IV  1 g Intravenous Q8H  . enoxaparin  40 mg Subcutaneous Daily   Continuous Infusions:   . sodium chloride    . 0.9 % NaCl with KCl 40 mEq / L 150 mL/hr at 11/08/10 0454   PRN Meds:.acetaminophen, acetaminophen, bisacodyl, morphine, ondansetron (ZOFRAN) IV, ondansetron, polyethylene glycol, promethazine, promethazine, sodium phosphate, traZODone Assessment/Plan: Patient Active Hospital Problem List: Pyelonephritis (11/07/2010)   On fortaz, spiked fever of 102 last night. Urine culture was contaminated. Repeat culture ordered. Urology consult appreciated. Will discharge her possibly in am after the urine culture results. Leukocytosis improved even though she had a temp last night.   Dehydration (11/07/2010)   Improving on iv fluids.  Hypokalemia (11/07/2010) Repleted.  Renal insufficiency : stable.    LOS: 2 days   Lindamarie Maclachlan 11/08/2010, 4:04 PM

## 2010-11-09 LAB — BASIC METABOLIC PANEL
CO2: 23 mEq/L (ref 19–32)
Calcium: 9.4 mg/dL (ref 8.4–10.5)
Creatinine, Ser: 1.1 mg/dL (ref 0.50–1.10)
Glucose, Bld: 86 mg/dL (ref 70–99)
Sodium: 132 mEq/L — ABNORMAL LOW (ref 135–145)

## 2010-11-09 LAB — CBC
Hemoglobin: 11.5 g/dL — ABNORMAL LOW (ref 12.0–15.0)
MCH: 27.6 pg (ref 26.0–34.0)
MCV: 83.7 fL (ref 78.0–100.0)
RBC: 4.16 MIL/uL (ref 3.87–5.11)

## 2010-11-09 LAB — URINE CULTURE
Colony Count: 10000
Culture  Setup Time: 201209222030

## 2010-11-09 MED ORDER — LEVOFLOXACIN 500 MG PO TABS: 500.0000 mg | ORAL_TABLET | Freq: Every day | ORAL | Status: AC

## 2010-11-09 MED ORDER — LEVOFLOXACIN 500 MG PO TABS
500.0000 mg | ORAL_TABLET | Freq: Every day | ORAL | Status: DC
  Administered 2010-11-09: 500 mg via ORAL
  Filled 2010-11-09: qty 1

## 2010-11-09 NOTE — Discharge Summary (Signed)
Physician Discharge Summary  Patient ID: [name] MRN: [id number] DOB/AGE: Apr 24, 1980 30 y.o. Primary Care Physician:HOWARD,KEVIN P, MD Admit date: 11/06/2010 Discharge date: 11/09/2010    Discharge Diagnoses:  Principal Problem:  *Pyelonephritis Active Problems:  UTI (urinary tract infection)  Renal colic on left side  Dehydration  Hypokalemia   Current Discharge Medication List    START taking these medications   Details  levofloxacin (LEVAQUIN) 500 MG tablet Take 1 tablet (500 mg total) by mouth daily. Qty: 7 tablet, Refills: 0      CONTINUE these medications which have NOT CHANGED   Details  naproxen sodium (ALEVE) 220 MG tablet Take 220 mg by mouth 2 (two) times daily.      oxyCODONE-acetaminophen (PERCOCET) 5-325 MG per tablet Take 1 tablet by mouth daily as needed. For pain     Estradiol Cypionate (DEPO-ESTRADIOL IM) Inject into the muscle every 3 (three) months.        STOP taking these medications     cephALEXin (KEFLEX) 500 MG capsule         Discharged Condition: improved and stable    Consults: urology  Significant Diagnostic Studies: Ct Abdomen Pelvis Wo Contrast  11/06/2010  *RADIOLOGY REPORT*  Clinical Data: Left flank pain, UTI, history of appendectomy  CT ABDOMEN AND PELVIS WITHOUT CONTRAST  Technique:  Multidetector CT imaging of the abdomen and pelvis was performed following the standard protocol without intravenous contrast.  Comparison: 11/04/2010  Findings: Lung bases are essentially clear.  Unenhanced liver, spleen, pancreas, and adrenal glands within normal limits.  Gallbladder unremarkable.  No intrahepatic or hepatic ductal dilatation.  Multiple nonobstructing right renal calculi, measuring up to 5 mm in the right lower pole (series 2/image 35).  No hydronephrosis.  Multiple left renal calculi, including a 14 mm calculus in the left lower pole (series 2/image 35) and a 12 mm calculus in the proximal collecting system (series 2/image  31).  Again seen are numerous low- density lesions in the left kidney, favoring chronic hydronephrosis, although there is mildly increased renal edema and surrounding perinephric stranding on the current study, suggesting an acute exacerbation.  Xanthogranulomatous pyelonephritis remains possible, but is considered less likely given in the slowly developing appearance over several months.  No evidence of bowel obstruction.  No evidence of abdominal aortic aneurysm.  No abdominopelvic ascites.  No suspicious abdominopelvic lymphadenopathy.  Stable small left para-aortic lymph nodes.  Uterus and bilateral ovaries are unremarkable.  No ureteral or bladder calculi.  Bladder is underdistended.  IMPRESSION: Multiple left renal calculi, including a 12 mm calculus in the proximal collecting system.  Multiple low density renal lesions, favored to reflect hydronephrosis due to acute exacerbation of chronic obstruction.  Xanthogranulomatous pyelonephritis remains possible, but is considered less likely.  Multiple nonobstructing right renal calculi.  No hydronephrosis.  No ureteral or bladder calculi.  Original Report Authenticated By: Charline Bills, M.D.   Ct Abdomen Pelvis Wo Contrast  11/04/2010  *RADIOLOGY REPORT*  Clinical Data: Right flank pain, history kidney stones  CT ABDOMEN AND PELVIS WITHOUT CONTRAST  Technique:  Multidetector CT imaging of the abdomen and pelvis was performed following the standard protocol without intravenous contrast. Sagittal and coronal MPR images reconstructed from axial data set.  Comparison: 06/27/2010  Findings: Minimal atelectasis medial right lung base. Numerous calculi in both kidneys. Largest calculus right kidney is at inferior pole 4 mm diameter image 37. Largest calculus in the left kidney measures 1.5 cm diameter lower pole image 37 with an additional  1.2 cm diameter calculus at the left renal pelvis. No right hydronephrosis, ureteral calculus, or ureteral dilatation  identified. Numerous large areas of low attenuation are seen within the left kidney, with left kidney appearing enlarged with associated mild surrounding infiltration of pararenal fat, slightly increased since previous study, could represent chronic obstruction or xanthogranulomatous pyelonephritis. Left ureter is dilated to the urinary bladder without visualization of ureteral calculus. Bladder is normal appearance without wall thickening or stone.  Within limits of a nonenhanced exam, no focal abnormalities of the liver, spleen, pancreas, or adrenal glands. Numerous normal and upper normal-sized left periaortic lymph nodes, increased versus previous exam. Small umbilical hernia containing fat. Unremarkable uterus and adnexae. Appendix not identified, with note of surgical clips adjacent to tip of cecum, question prior appendectomy. Bowel loops suboptimally assessed due to lack of GI contrast, no gross abnormality identified. Normal-appearing stomach. No mass, adenopathy, free fluid, or additional inflammatory process. No acute osseous findings.  IMPRESSION: Numerous bilateral renal calculi, larger on the left. No definite evidence of right ureteral calculus or urinary obstruction. Numerous fluid attenuation foci throughout the enlarged left kidney, question cysts or chronic obstruction or hydronephrosis but cannot exclude xanthogranulomatous pyelonephritis.  Original Report Authenticated By: Lollie Marrow, M.D.    Lab Results: Results for orders placed during the hospital encounter of 11/06/10 (from the past 48 hour(s))  CBC     Status: Abnormal   Collection Time   11/08/10  5:30 AM      Component Value Range Comment   WBC 11.6 (*) 4.0 - 10.5 (K/uL)    RBC 3.97  3.87 - 5.11 (MIL/uL)    Hemoglobin 11.0 (*) 12.0 - 15.0 (g/dL)    HCT 45.4 (*) 09.8 - 46.0 (%)    MCV 84.9  78.0 - 100.0 (fL)    MCH 27.7  26.0 - 34.0 (pg)    MCHC 32.6  30.0 - 36.0 (g/dL)    RDW 11.9  14.7 - 82.9 (%)    Platelets 300  150  - 400 (K/uL)   BASIC METABOLIC PANEL     Status: Abnormal   Collection Time   11/08/10  5:30 AM      Component Value Range Comment   Sodium 137  135 - 145 (mEq/L)    Potassium 5.0  3.5 - 5.1 (mEq/L)    Chloride 109  96 - 112 (mEq/L)    CO2 25  19 - 32 (mEq/L)    Glucose, Bld 94  70 - 99 (mg/dL)    BUN 6  6 - 23 (mg/dL)    Creatinine, Ser 5.62 (*) 0.50 - 1.10 (mg/dL)    Calcium 9.0  8.4 - 10.5 (mg/dL)    GFR calc non Af Amer 53 (*) >60 (mL/min)    GFR calc Af Amer >60  >60 (mL/min)   CULTURE, BLOOD (ROUTINE SINGLE)     Status: Normal (Preliminary result)   Collection Time   11/08/10  4:20 PM      Component Value Range Comment   Specimen Description BLOOD RIGHT ARM      Special Requests BOTTLES DRAWN AEROBIC AND ANAEROBIC 4CC EACH      Culture NO GROWTH 1 DAY      Report Status PENDING     CBC     Status: Abnormal   Collection Time   11/09/10  4:23 AM      Component Value Range Comment   WBC 13.6 (*) 4.0 - 10.5 (K/uL)    RBC 4.16  3.87 - 5.11 (MIL/uL)    Hemoglobin 11.5 (*) 12.0 - 15.0 (g/dL)    HCT 16.1 (*) 09.6 - 46.0 (%)    MCV 83.7  78.0 - 100.0 (fL)    MCH 27.6  26.0 - 34.0 (pg)    MCHC 33.0  30.0 - 36.0 (g/dL)    RDW 04.5  40.9 - 81.1 (%)    Platelets 318  150 - 400 (K/uL)   BASIC METABOLIC PANEL     Status: Abnormal   Collection Time   11/09/10  4:23 AM      Component Value Range Comment   Sodium 132 (*) 135 - 145 (mEq/L)    Potassium 4.2  3.5 - 5.1 (mEq/L)    Chloride 100  96 - 112 (mEq/L) DELTA CHECK NOTED   CO2 23  19 - 32 (mEq/L)    Glucose, Bld 86  70 - 99 (mg/dL)    BUN 4 (*) 6 - 23 (mg/dL)    Creatinine, Ser 9.14  0.50 - 1.10 (mg/dL)    Calcium 9.4  8.4 - 10.5 (mg/dL)    GFR calc non Af Amer 58 (*) >60 (mL/min)    GFR calc Af Amer >60  >60 (mL/min)    Recent Results (from the past 240 hour(s))  URINE CULTURE     Status: Normal   Collection Time   11/04/10 11:40 AM      Component Value Range Status Comment   Specimen Description URINE, CLEAN CATCH   Final     Special Requests NONE   Final    Setup Time [id number]   Final    Colony Count 25,000 COLONIES/ML   Final    Culture     Final    Value: Multiple bacterial morphotypes present, none predominant. Suggest appropriate recollection if clinically indicated.   Report Status 11/05/2010 FINAL   Final   CULTURE, BLOOD (ROUTINE SINGLE)     Status: Normal (Preliminary result)   Collection Time   11/08/10  4:20 PM      Component Value Range Status Comment   Specimen Description BLOOD RIGHT ARM   Final    Special Requests BOTTLES DRAWN AEROBIC AND ANAEROBIC 4CC EACH   Final    Culture NO GROWTH 1 DAY   Final    Report Status PENDING   Incomplete      Hospital Course:   [name] is a 30 year old female who presented to the ED with flank pain fever pyelonephritis was admitted to the hospital initial urine culture were sent which was unrevealing a repeat urine culture has been done which is not back yet. She has been on [place]. She has been afebrile for over 24 hours and needs to get home as she has 2 small children to get ready for school tomorrow. She is feeling much better. She is tolerating a regular diet. Her CAT scan of her abdomen pelvis shows multiple stones she is aware of this.   Urology was consult and she'll need very close followup she is to make an appointment with him in approximately one week or if she has recurring symptoms or temperature over 100.4. I switched her antibiotics over to Levaquin. She is to complete a one-week course of that.  Discharge Exam: Blood pressure 121/64, pulse 94, temperature 99.3 F (37.4 C), temperature source Oral, resp. rate 18, height 5\' 3"  (1.6 m), weight 69.083 kg (152 lb 4.8 oz), SpO2 100.00%. General alert and oriented no apparent distress cooperative and friendly HEENT oropharynx  clear mucous murmurs moist gauze regular rate and rhythm without murmurs rubs or gallops chest clear to auscultation bilaterally no wheezes or rhonchi or rales abdomen is soft  nontender nondistended positive bowel sounds and no rebound no guarding no flank pain extremities no clubbing cyanosis or edema psych normal mood affect neuro no focal neurologic deficits  Disposition: home  Discharge Orders    Future Orders Please Complete By Expires   Diet - low sodium heart healthy      Increase activity slowly      Call MD for:  temperature >100.4         Follow-up Information    Follow up with JAVAID,MOHAMMAD I. Call in 1 week. (If symptoms worsen)    Contact information:   [address] [place] [address] [phone]          Signed: Haydee Monica 11/09/2010, 2:39 PM

## 2010-11-10 NOTE — Consult Note (Signed)
[name], AUTHIER NO.:  [id number]  MEDICAL RECORD NO.:  [id number]  LOCATION:                                 FACILITY:  PHYSICIAN:  Ky Barban, M.D.DATE OF BIRTH:  Nov 26, 1980  DATE OF CONSULTATION: DATE OF DISCHARGE:                                CONSULTATION   This patient who is 30 year old well known to me, I have followed up for many years in the office with bilateral renal calculi.  I had done procedure previously was stone baskets and double-J stents but now her stones had become larger in size.  There are multiple large stones in both kidneys, and I have already recommended that she go to [facility] and undergo percutaneous nephrolithotripsy.  I seen her this last Monday in the office and made arrangement for her to go to [facility] on November 24, 2010, to have this procedure done, but last night, the patient started to have nausea, vomiting, could not keep anything down, so she came to the emergency room where she was admitted for further management of this problem.  Today, she is feeling much better.  She is not vomiting nor running any fever, chills, or she has any gross hematuria.  Her white count which was 15,000 has come down to 14.4, her hematocrit is 34.7. Her sodium is today 137, potassium 3.9, chloride 106, CO2 is 25, BUN is 6, creatinine is 1.29.  She had a renal ultrasound done yesterday and then again last night in the emergency room.  It shows multiple bilateral renal calculi.  They are smaller as compared to the left renal calculi.  Left kidney shows evidence of chronic hydronephrosis.  There is mildly increased renal edema, and surrounding perinephric stranding on the current study.  Xanthogranulomatous pyelonephritis remains possible, but is considered less likely given the slowly developing appearance over several months.  Rest of the organs are essentially normal.  The ureter on either side is not dilated.  There is no  ureteral calculi.  PAST MEDICAL HISTORY:  History of recurrent renal calculi and ureteral calculi, history of having ES lithotripsy on the left side and also stone basket for left ureteral calculi.  No history of diabetes or hypertension.  PHYSICAL EXAMINATION:  GENERAL:  On examination moderately built not in acute distress, fully conscious, alert, orientated. VITAL SIGNS:  Temperature is 98.5, pulse 95 per minute.  Blood pressure 121/77.  O2 saturation is 90%.  She said she is not having any pain. ABDOMEN:  Soft, flat.  Liver, spleen, kidneys are not palpable, 1+ left CVA tenderness.  PELVIC:  Deferred. EXTREMITIES:  Normal.  IMPRESSION:  Bilateral renal calculi, possible pyelonephritis left. Recommend continue IV fluids and IV antibiotics.  She is feeling much better and would like to try regular foods, so I am going to put her on a regular food, and in the morning of she is symptom free, she can be discharged home.  We already had made an appointment for her to go to [facility].  I discussed this with the patient.  She will go there as an outpatient to see one of the urologist there and for percutaneous nephrolithotripsy on the left side.  Ky Barban, M.D.     MIJ/MEDQ  D:  11/07/2010  T:  11/07/2010  Job:  [id number]

## 2010-11-12 LAB — URINALYSIS, ROUTINE W REFLEX MICROSCOPIC
Glucose, UA: 250 — AB
Protein, ur: >300 — AB

## 2010-11-12 LAB — BASIC METABOLIC PANEL
CO2: 21
Chloride: 103
Glucose, Bld: 132 — ABNORMAL HIGH
Potassium: 3.1 — ABNORMAL LOW
Sodium: 133 — ABNORMAL LOW

## 2010-11-12 LAB — CULTURE, BLOOD (ROUTINE X 2)
Culture: NO GROWTH
Report Status: 5092009
Report Status: 5092009

## 2010-11-12 LAB — CBC
HCT: 34.5 — ABNORMAL LOW
Hemoglobin: 11.9 — ABNORMAL LOW
MCHC: 34.4
MCV: 81.7
RDW: 13

## 2010-11-12 LAB — DIFFERENTIAL
Basophils Absolute: 0
Basophils Relative: 0
Eosinophils Relative: 0
Lymphocytes Relative: 11 — ABNORMAL LOW
Monocytes Absolute: 1.5 — ABNORMAL HIGH

## 2010-11-16 NOTE — ED Provider Notes (Signed)
CSN: [id number]  Arrival date & time: 11/04/2010 11:32 AM  Chief Complaint   Patient presents with   .  Flank Pain    HPI  Pt woke up at 4am with left sided pain, sharp in nature, feels similar to previous kidney stones. Nothing makes pain worse, it is intermittent, not sure what brings it on. Tried Aleve, which dulled the pain but did not fully resolve it. Was 8/10 when she came, increased to 10/10 while sitting, now 6/10 s/p fentanyl and zofran. No fevers or chills. + nausea, no vomiting or diarrhea. No dysuria, urgency.  Past Medical History   Diagnosis  Date   .  Kidney stones     History reviewed. No pertinent past surgical history.  No family history on file.  History   Substance Use Topics   .  Smoking status:  Not on file   .  Smokeless tobacco:  Not on file   .  Alcohol Use:     OB History    Grav  Para  Term  Preterm  Abortions  TAB  SAB  Ect  Mult  Living                  Review of Systems  Constitutional: Negative for fever, chills, malaise/fatigue and diaphoresis.  HENT: Negative for congestion, sore throat and neck pain.  Respiratory: Negative for cough and wheezing.  Cardiovascular: Negative for chest pain and palpitations.  Gastrointestinal: Positive for nausea. Negative for heartburn, vomiting, abdominal pain, diarrhea and constipation.  Genitourinary: Positive for flank pain. Negative for dysuria, urgency, frequency and hematuria.  Musculoskeletal: Positive for back pain. Negative for myalgias.  Skin: Negative for rash.  Neurological: Negative for dizziness and headaches.  Hematological: Does not bruise/bleed easily.  Psychiatric/Behavioral: Negative for depression.   Review of Systems  Constitutional: Negative for fever, chills, malaise/fatigue and diaphoresis.  HENT: Negative for congestion, sore throat and neck pain.  Respiratory: Negative for cough and wheezing.  Cardiovascular: Negative for chest pain and palpitations.  Gastrointestinal: Positive for  nausea. Negative for heartburn, vomiting, abdominal pain, diarrhea and constipation.  Genitourinary: Positive for flank pain. Negative for dysuria, urgency, frequency and hematuria.  Musculoskeletal: Positive for back pain. Negative for myalgias.  Skin: Negative for rash.  Neurological: Negative for dizziness and headaches.  Endo/Heme/Allergies: Does not bruise/bleed easily.  Psychiatric/Behavioral: Negative for depression.   Allergies   Other  Home Medications    Current Outpatient Rx   Name  Route  Sig  Dispense  Refill   .  DEPO-ESTRADIOL IM  Intramuscular  Inject into the muscle every 3 (three) months.     Marland Kitchen  NAPROXEN SODIUM 220 MG PO TABS  Oral  Take 220 mg by mouth 2 (two) times daily.      Physical Exam   BP 113/55  Pulse 67  Temp(Src) 98.6 F (37 C) (Oral)  Resp 21  Ht 5\' 3"  (1.6 m)  Wt 150 lb (68.04 kg)  BMI 26.57 kg/m2  SpO2 98%  Physical Exam  Constitutional: She is oriented to person, place, and time. She appears well-developed and well-nourished. No distress.  HENT:  Head: Normocephalic and atraumatic.  Neck: Normal range of motion. Neck supple.  Cardiovascular: Normal rate and regular rhythm.  No murmur heard.  Pulmonary/Chest: Effort normal and breath sounds normal. No respiratory distress.  Abdominal: Soft. Bowel sounds are normal. There is no tenderness. There is no CVA tenderness.  Musculoskeletal: Normal range of motion. She exhibits no edema.  Neurological: She is alert and oriented to person, place, and time.  Skin: Skin is warm and dry. She is not diaphoretic.  Psychiatric: She has a normal mood and affect.   ED Course   Procedures  Results for orders placed during the hospital encounter of 11/04/10   URINALYSIS, ROUTINE W REFLEX MICROSCOPIC   Component  Value  Range    Color, Urine  YELLOW  YELLOW    Appearance  HAZY (*)  CLEAR    Specific Gravity, Urine  1.025  1.005 - 1.030    pH  7.0  5.0 - 8.0    Glucose, UA  NEGATIVE  NEGATIVE (mg/dL)     Hgb urine dipstick  LARGE (*)  NEGATIVE    Bilirubin Urine  NEGATIVE  NEGATIVE    Ketones, ur  TRACE (*)  NEGATIVE (mg/dL)    Protein, ur  409 (*)  NEGATIVE (mg/dL)    Urobilinogen, UA  0.2  0.0 - 1.0 (mg/dL)    Nitrite  POSITIVE (*)  NEGATIVE    Leukocytes, UA  MODERATE (*)  NEGATIVE   PREGNANCY, URINE   Component  Value  Range    Preg Test, Ur  NEGATIVE    URINE MICROSCOPIC-ADD ON   Component  Value  Range    Squamous Epithelial / LPF  FEW (*)  RARE    WBC, UA  TOO NUMEROUS TO COUNT  <3 (WBC/hpf)    RBC / HPF  TOO NUMEROUS TO COUNT  <3 (RBC/hpf)    Bacteria, UA  MANY (*)  RARE   CBC   Component  Value  Range    WBC  13.9 (*)  4.0 - 10.5 (K/uL)    RBC  4.38  3.87 - 5.11 (MIL/uL)    Hemoglobin  12.3  12.0 - 15.0 (g/dL)    HCT  81.1  91.4 - 78.2 (%)    MCV  84.5  78.0 - 100.0 (fL)    MCH  28.1  26.0 - 34.0 (pg)    MCHC  33.2  30.0 - 36.0 (g/dL)    RDW  95.6  21.3 - 08.6 (%)    Platelets  294  150 - 400 (K/uL)   DIFFERENTIAL   Component  Value  Range    Neutrophils Relative  64  43 - 77 (%)    Neutro Abs  8.9 (*)  1.7 - 7.7 (K/uL)    Lymphocytes Relative  25  12 - 46 (%)    Lymphs Abs  3.5  0.7 - 4.0 (K/uL)    Monocytes Relative  9  3 - 12 (%)    Monocytes Absolute  1.3 (*)  0.1 - 1.0 (K/uL)    Eosinophils Relative  2  0 - 5 (%)    Eosinophils Absolute  0.2  0.0 - 0.7 (K/uL)    Basophils Relative  0  0 - 1 (%)    Basophils Absolute  0.0  0.0 - 0.1 (K/uL)   BASIC METABOLIC PANEL   Component  Value  Range    Sodium  138  135 - 145 (mEq/L)    Potassium  3.9  3.5 - 5.1 (mEq/L)    Chloride  108  96 - 112 (mEq/L)    CO2  23  19 - 32 (mEq/L)    Glucose, Bld  78  70 - 99 (mg/dL)    BUN  13  6 - 23 (mg/dL)    Creatinine, Ser  5.78  0.50 - 1.10 (  mg/dL)    Calcium  9.0  8.4 - 10.5 (mg/dL)    GFR calc non Af Amer  >60  >60 (mL/min)    GFR calc Af Amer  >60  >60 (mL/min)    1.  Flank pain   2.  UTI (urinary tract infection)    MDM  Concern for UTI with or w/o pyelo vs  infected stone. UA c/w UTI, will obtain CT abd/pelvis to assess for obstructing/infected stone  Cr nml  WBC elevated, also c/w infection  After CT was read, discussed the case with pt's urologist at [facility] who stated that this is a chronic problem with the enlarged left kidney and that there is nothing acute to do for it. They were in agreement with treating her UTI as an outpatient and emphasized multiple times that pt needs to go to [facility] for this problem, as they have explained to her multiple times in the office. No need for inpatient admission per Urology or for other acute work up at this time.    I performed a history and physical examination of [name] and discussed her management with Dr.McGill.  I agree with the history, physical, assessment, and plan of care, with the following exceptions: None  I was present for the following procedures: None Time Spent in Critical Care of the patient: None   Biagio Snelson L    Nelia Shi, MD 11/16/10 [phone]

## 2010-11-25 DIAGNOSIS — N1330 Unspecified hydronephrosis: Secondary | ICD-10-CM | POA: Insufficient documentation

## 2010-11-25 DIAGNOSIS — N200 Calculus of kidney: Secondary | ICD-10-CM | POA: Insufficient documentation

## 2010-12-01 LAB — CBC
HCT: 36.6
Platelets: 249
WBC: 9.4

## 2010-12-01 LAB — URINE MICROSCOPIC-ADD ON

## 2010-12-01 LAB — URINALYSIS, ROUTINE W REFLEX MICROSCOPIC
Leukocytes, UA: NEGATIVE
Nitrite: NEGATIVE
Specific Gravity, Urine: 1.01
Urobilinogen, UA: 1

## 2010-12-01 LAB — HCG, QUANTITATIVE, PREGNANCY: hCG, Beta Chain, Quant, S: <2

## 2010-12-01 LAB — BASIC METABOLIC PANEL
BUN: 8
GFR calc non Af Amer: >60
Potassium: 3.9

## 2010-12-04 LAB — URINALYSIS, ROUTINE W REFLEX MICROSCOPIC
Bilirubin Urine: NEGATIVE
Glucose, UA: NEGATIVE
Ketones, ur: NEGATIVE
Protein, ur: NEGATIVE

## 2010-12-04 LAB — CBC
HCT: 33.4 — ABNORMAL LOW
MCHC: 34.4
MCV: 80.7
Platelets: 278

## 2010-12-04 LAB — BASIC METABOLIC PANEL
BUN: 9
CO2: 22
Chloride: 111
Creatinine, Ser: 0.95

## 2010-12-04 LAB — URINE MICROSCOPIC-ADD ON

## 2010-12-04 LAB — DIFFERENTIAL
Basophils Relative: 1
Eosinophils Absolute: 0.2
Eosinophils Relative: 2
Monocytes Relative: 8
Neutrophils Relative %: 54

## 2010-12-04 LAB — HCG, QUANTITATIVE, PREGNANCY: hCG, Beta Chain, Quant, S: 2

## 2010-12-08 DIAGNOSIS — N138 Other obstructive and reflux uropathy: Secondary | ICD-10-CM | POA: Insufficient documentation

## 2011-06-13 DIAGNOSIS — N390 Urinary tract infection, site not specified: Secondary | ICD-10-CM | POA: Insufficient documentation

## 2011-06-15 HISTORY — PX: PERCUTANEOUS NEPHROSTOLITHOTOMY: SHX2207

## 2012-06-04 ENCOUNTER — Emergency Department (HOSPITAL_COMMUNITY)
Admission: EM | Admit: 2012-06-04 | Discharge: 2012-06-04 | Disposition: A | Discharge status: Home / Self Care | Payer: Medicaid Other | Attending: Emergency Medicine | Admitting: Emergency Medicine

## 2012-06-04 ENCOUNTER — Encounter (HOSPITAL_COMMUNITY): Payer: Self-pay

## 2012-06-04 DIAGNOSIS — J029 Acute pharyngitis, unspecified: Secondary | ICD-10-CM | POA: Insufficient documentation

## 2012-06-04 DIAGNOSIS — J3489 Other specified disorders of nose and nasal sinuses: Secondary | ICD-10-CM | POA: Insufficient documentation

## 2012-06-04 DIAGNOSIS — Z87442 Personal history of urinary calculi: Secondary | ICD-10-CM | POA: Insufficient documentation

## 2012-06-04 DIAGNOSIS — F17200 Nicotine dependence, unspecified, uncomplicated: Secondary | ICD-10-CM | POA: Insufficient documentation

## 2012-06-04 DIAGNOSIS — R509 Fever, unspecified: Secondary | ICD-10-CM | POA: Insufficient documentation

## 2012-06-04 DIAGNOSIS — Z87440 Personal history of urinary (tract) infections: Secondary | ICD-10-CM | POA: Insufficient documentation

## 2012-06-04 LAB — RAPID STREP SCREEN (MED CTR MEBANE ONLY): Streptococcus, Group A Screen (Direct): NEGATIVE

## 2012-06-04 MED ORDER — HYDROCODONE-ACETAMINOPHEN 7.5-500 MG/15ML PO SOLN: 15.0000 mL | Freq: Four times a day (QID) | ORAL | Status: DC | PRN

## 2012-06-04 MED ORDER — LIDOCAINE VISCOUS 2 % MT SOLN
20.0000 mL | Freq: Once | OROMUCOSAL | Status: AC
  Administered 2012-06-04: 20 mL via OROMUCOSAL
  Filled 2012-06-04: qty 15

## 2012-06-04 MED ORDER — IBUPROFEN 600 MG PO TABS: 600.0000 mg | ORAL_TABLET | Freq: Four times a day (QID) | ORAL | Status: DC | PRN

## 2012-06-04 MED ORDER — IBUPROFEN 800 MG PO TABS
800.0000 mg | ORAL_TABLET | Freq: Once | ORAL | Status: AC
  Administered 2012-06-04: 800 mg via ORAL
  Filled 2012-06-04: qty 1

## 2012-06-04 NOTE — ED Notes (Signed)
Pt c/o sore throat and headache since yesterday.  Unable to tolerate saliva, says is having to spit.

## 2012-06-04 NOTE — ED Notes (Signed)
Patient with no complaints at this time. Respirations even and unlabored. Skin warm/dry. Discharge instructions reviewed with patient at this time. Patient given opportunity to voice concerns/ask questions. Patient discharged at this time and left Emergency Department with steady gait.   

## 2012-06-07 NOTE — ED Provider Notes (Signed)
History     CSN: [id number]  Arrival date & time 06/04/12  1212   First MD Initiated Contact with Patient 06/04/12 1218      Chief Complaint  Patient presents with  . Sore Throat    (Consider location/radiation/quality/duration/timing/severity/associated sxs/prior treatment) Patient is a 32 y.o. female presenting with pharyngitis. The history is provided by the patient.  Sore Throat This is a new problem. The current episode started yesterday. The problem occurs constantly. The problem has been gradually worsening. Associated symptoms include chills, congestion, a fever, a sore throat and swollen glands. Pertinent negatives include no abdominal pain, arthralgias, chest pain, coughing, headaches, joint swelling, neck pain, numbness, rash or weakness. The symptoms are aggravated by swallowing. She has tried nothing for the symptoms.    Past Medical History  Diagnosis Date  . Kidney stones   . UTI (urinary tract infection)     Past Surgical History  Procedure Laterality Date  . Appendectomy    . Kidney surgery      No family history on file.  History  Substance Use Topics  . Smoking status: Current Every Day Smoker  . Smokeless tobacco: Not on file  . Alcohol Use: No    OB History   Grav Para Term Preterm Abortions TAB SAB Ect Mult Living                  Review of Systems  Constitutional: Positive for fever and chills.  HENT: Positive for congestion and sore throat. Negative for neck pain.   Eyes: Negative.   Respiratory: Negative for cough and shortness of breath.   Cardiovascular: Negative for chest pain.  Gastrointestinal: Negative for abdominal pain.  Genitourinary: Negative.   Musculoskeletal: Negative for joint swelling and arthralgias.  Skin: Negative.  Negative for rash and wound.  Neurological: Negative for dizziness, weakness, light-headedness, numbness and headaches.  Psychiatric/Behavioral: Negative.     Allergies  Other  Home Medications    Current Outpatient Rx  Name  Route  Sig  Dispense  Refill  . HYDROcodone-acetaminophen (LORTAB) 7.5-500 MG/15ML solution   Oral   Take 15 mLs by mouth every 6 (six) hours as needed for pain.   120 mL   0   . ibuprofen (ADVIL,MOTRIN) 600 MG tablet   Oral   Take 1 tablet (600 mg total) by mouth every 6 (six) hours as needed for pain.   20 tablet   0     BP 134/75  Pulse 99  Temp(Src) 100.4 F (38 C) (Oral)  Resp 18  SpO2 98%  LMP 05/27/2012  Physical Exam  Constitutional: She is oriented to person, place, and time. She appears well-developed and well-nourished.  HENT:  Head: Normocephalic and atraumatic.  Right Ear: Tympanic membrane and ear canal normal.  Left Ear: Tympanic membrane and ear canal normal.  Nose: Mucosal edema present.  Mouth/Throat: Uvula is midline. Mucous membranes are not pale and not dry. Oropharyngeal exudate and posterior oropharyngeal erythema present. No posterior oropharyngeal edema or tonsillar abscesses.  Eyes: Conjunctivae are normal.  Cardiovascular: Normal rate and normal heart sounds.   Pulmonary/Chest: Effort normal. No respiratory distress. She has no wheezes. She has no rales.  Abdominal: Soft. There is no tenderness.  Musculoskeletal: Normal range of motion.  Neurological: She is alert and oriented to person, place, and time.  Skin: Skin is warm and dry. No rash noted.  Psychiatric: She has a normal mood and affect.    ED Course  Procedures (including critical  care time)  Labs Reviewed  RAPID STREP SCREEN  CULTURE, RESPIRATORY (NON-EXPECTORATED)   No results found.   1. Viral pharyngitis       MDM  Rapid strep negative, culture sent.  Pt prescribed lortab elixer,  Ibuprofen.  Rest, fluids.  Recheck by pcp or return here for any worsened sx.         Burgess Amor, PA-C 06/07/12 2015

## 2012-06-09 NOTE — ED Provider Notes (Signed)
Medical screening examination/treatment/procedure(s) were performed by non-physician practitioner and as supervising physician I was immediately available for consultation/collaboration.   Takeesha Isley M Elysse Polidore, DO 06/09/12 1605 

## 2013-01-02 ENCOUNTER — Emergency Department (HOSPITAL_COMMUNITY)
Admission: EM | Admit: 2013-01-02 | Discharge: 2013-01-02 | Disposition: A | Discharge status: Home / Self Care | Payer: Medicaid Other | Attending: Emergency Medicine | Admitting: Emergency Medicine

## 2013-01-02 ENCOUNTER — Emergency Department (HOSPITAL_COMMUNITY): Payer: Medicaid Other

## 2013-01-02 ENCOUNTER — Encounter (HOSPITAL_COMMUNITY): Payer: Self-pay | Admitting: Emergency Medicine

## 2013-01-02 DIAGNOSIS — Z87442 Personal history of urinary calculi: Secondary | ICD-10-CM | POA: Insufficient documentation

## 2013-01-02 DIAGNOSIS — N201 Calculus of ureter: Secondary | ICD-10-CM | POA: Insufficient documentation

## 2013-01-02 DIAGNOSIS — Z87440 Personal history of urinary (tract) infections: Secondary | ICD-10-CM | POA: Insufficient documentation

## 2013-01-02 DIAGNOSIS — Z3202 Encounter for pregnancy test, result negative: Secondary | ICD-10-CM | POA: Insufficient documentation

## 2013-01-02 DIAGNOSIS — F17200 Nicotine dependence, unspecified, uncomplicated: Secondary | ICD-10-CM | POA: Insufficient documentation

## 2013-01-02 LAB — PREGNANCY, URINE: Preg Test, Ur: NEGATIVE

## 2013-01-02 LAB — URINALYSIS, ROUTINE W REFLEX MICROSCOPIC
Bilirubin Urine: NEGATIVE
Leukocytes, UA: NEGATIVE
Nitrite: NEGATIVE
Specific Gravity, Urine: 1.025 (ref 1.005–1.030)
Urobilinogen, UA: 0.2 mg/dL (ref 0.0–1.0)
pH: 6 (ref 5.0–8.0)

## 2013-01-02 LAB — URINE MICROSCOPIC-ADD ON

## 2013-01-02 MED ORDER — OXYCODONE-ACETAMINOPHEN 5-325 MG PO TABS: 1.0000 | ORAL_TABLET | ORAL | Status: DC | PRN

## 2013-01-02 MED ORDER — KETOROLAC TROMETHAMINE 30 MG/ML IJ SOLN
30.0000 mg | Freq: Once | INTRAMUSCULAR | Status: AC
  Administered 2013-01-02: 30 mg via INTRAVENOUS
  Filled 2013-01-02: qty 1

## 2013-01-02 MED ORDER — ONDANSETRON 8 MG PO TBDP: 8.0000 mg | ORAL_TABLET | Freq: Three times a day (TID) | ORAL | Status: DC | PRN

## 2013-01-02 MED ORDER — ONDANSETRON HCL 4 MG/2ML IJ SOLN
4.0000 mg | Freq: Once | INTRAMUSCULAR | Status: AC
  Administered 2013-01-02: 4 mg via INTRAVENOUS
  Filled 2013-01-02: qty 2

## 2013-01-02 MED ORDER — HYDROMORPHONE HCL PF 1 MG/ML IJ SOLN
1.0000 mg | Freq: Once | INTRAMUSCULAR | Status: AC
  Administered 2013-01-02: 1 mg via INTRAVENOUS
  Filled 2013-01-02: qty 1

## 2013-01-02 NOTE — ED Provider Notes (Signed)
CSN: [id number]     Arrival date & time 01/02/13  1622 History   First MD Initiated Contact with Patient 01/02/13 2109     This chart was scribed for Lyanne Co, MD by Arlan Organ, ED Scribe. This patient was seen in room APA11/APA11 and the patient's care was started 9:20 PM.  Chief Complaint  Patient presents with  . Flank Pain   The history is provided by the patient. No language interpreter was used.   HPI Comments: [name] is a 32 y.o. female with a hx of kidney stones who presents to the Emergency Department complaining of sudden onset, gradually worsening, constant left sided flank pain that started this morning. She also reports nausea and emesis. Pt reports 2 episodes of emesis today. She reports having stents placed for previous treatment for her kidney stones. She states she had a nephrostomy on 06/12/[facility] at [facility].  Pain is moderate to severe in severity.  Nothing worsens or improves her pain.  No fevers or chills.  No dysuria  Past Medical History  Diagnosis Date  . Kidney stones   . UTI (urinary tract infection)    Past Surgical History  Procedure Laterality Date  . Appendectomy    . Kidney surgery     No family history on file. History  Substance Use Topics  . Smoking status: Current Every Day Smoker  . Smokeless tobacco: Not on file  . Alcohol Use: No   OB History   Grav Para Term Preterm Abortions TAB SAB Ect Mult Living                 Review of Systems A complete 10 system review of systems was obtained and all systems are negative except as noted in the HPI and PMH.    Allergies  Other  Home Medications   Current Outpatient Rx  Name  Route  Sig  Dispense  Refill  . ondansetron (ZOFRAN ODT) 8 MG disintegrating tablet   Oral   Take 1 tablet (8 mg total) by mouth every 8 (eight) hours as needed for nausea or vomiting.   10 tablet   0   . oxyCODONE-acetaminophen (PERCOCET/ROXICET) 5-325 MG per tablet   Oral   Take 1 tablet by mouth  every 4 (four) hours as needed for severe pain.   20 tablet   0    Triage Vitals: BP 143/80  Pulse 81  Temp(Src) 98.4 F (36.9 C) (Oral)  Resp 17  Ht 5\' 3"  (1.6 m)  SpO2 96%  Physical Exam  Nursing note and vitals reviewed. Constitutional: She is oriented to person, place, and time. She appears well-developed and well-nourished. No distress.  HENT:  Head: Normocephalic and atraumatic.  Eyes: EOM are normal.  Neck: Normal range of motion.  Cardiovascular: Normal rate, regular rhythm and normal heart sounds.   Pulmonary/Chest: Effort normal and breath sounds normal.  Abdominal: Soft. She exhibits no distension. There is no tenderness.  Musculoskeletal: Normal range of motion.  Neurological: She is alert and oriented to person, place, and time.  Skin: Skin is warm and dry.  Psychiatric: She has a normal mood and affect. Judgment normal.    ED Course  Procedures (including critical care time)  DIAGNOSTIC STUDIES: Oxygen Saturation is 96% on RA, Adequate by my interpretation.    COORDINATION OF CARE: 9:23 PM- Will give pain medication. Will order urinalysis. Discussed treatment plan with pt at bedside and pt agreed to plan.  Labs Review Labs Reviewed  URINALYSIS, ROUTINE W REFLEX MICROSCOPIC - Abnormal; Notable for the following:    Hgb urine dipstick MODERATE (*)    All other components within normal limits  URINE MICROSCOPIC-ADD ON - Abnormal; Notable for the following:    Squamous Epithelial / LPF MANY (*)    Bacteria, UA FEW (*)    All other components within normal limits  PREGNANCY, URINE   Imaging Review Ct Abdomen Pelvis Wo Contrast  01/02/2013   CLINICAL DATA:  Left flank pain, history of nephrolithiasis.  EXAM: CT ABDOMEN AND PELVIS WITHOUT CONTRAST  TECHNIQUE: Multidetector CT imaging of the abdomen and pelvis was performed following the standard protocol without intravenous contrast.  COMPARISON:  11/06/2010  FINDINGS: Mild dependent atelectasis versus  early infiltrate in the posterior visualized portion of left lower lobe. Unremarkable uninfused evaluation of liver, nondistended gallbladder, spleen, adrenal glands, pancreas, aorta.  There is bilateral nephrolithiasis. Largest stone on the right in the upper pole 5 mm, on the left in the upper pole 3 mm. Left renal calculus load has significantly decreased since previous scan. There is a 1 mm calculus near the left UPJ, with marked left hydronephrosis. There are streaky inflammatory/edematous changes around the left kidney. Ureters are nondilated. Urinary bladder is incompletely distended.  Stomach is physiologically distended by ingested material. Small bowel and colon are nondilated. Vascular clips near the base of the cecum. Uterus and adnexal regions unremarkable. No ascites. No free air. No adenopathy localized. Lumbar spine unremarkable.  IMPRESSION: 1. Bilateral nephrolithiasis, with marked left hydronephrosis secondary to 1 mm UPJ calculus.   Electronically Signed   By: Oley Balm M.D.   On: 01/02/2013 22:15  I personally reviewed the imaging tests through PACS system I reviewed available ER/hospitalization records through the EMR   EKG Interpretation   None       MDM   1. Ureteral stone    11:32 PM Patient feels much better at this time.  Distal ureteral stone.  Urology follow up.  Understands to return to the ER for new or worsening symptoms  I personally performed the services described in this documentation, which was scribed in my presence. The recorded information has been reviewed and is accurate.      Lyanne Co, MD 01/02/13 [phone]

## 2013-01-02 NOTE — ED Notes (Signed)
Pt states left flank pain began this morning. Hx of kidney stones. Pt is tearful in triage. Vomited x 2 earlier today. Noticed blood in urine this morning.

## 2013-01-16 ENCOUNTER — Encounter (HOSPITAL_COMMUNITY): Payer: Self-pay

## 2013-01-16 ENCOUNTER — Encounter (HOSPITAL_COMMUNITY)
Admission: RE | Admit: 2013-01-16 | Discharge: 2013-01-16 | Disposition: A | Discharge status: Home / Self Care | Payer: Medicaid Other | Source: Ambulatory Visit | Attending: Urology | Admitting: Urology

## 2013-01-16 ENCOUNTER — Encounter (HOSPITAL_COMMUNITY): Payer: Self-pay | Admitting: Pharmacy Technician

## 2013-01-16 LAB — BASIC METABOLIC PANEL
BUN: 9 mg/dL (ref 6–23)
Calcium: 9.8 mg/dL (ref 8.4–10.5)
Chloride: 106 mEq/L (ref 96–112)
Creatinine, Ser: 1.1 mg/dL (ref 0.50–1.10)
GFR calc Af Amer: 76 mL/min — ABNORMAL LOW (ref >90)
Glucose, Bld: 86 mg/dL (ref 70–99)

## 2013-01-16 LAB — HEMOGLOBIN AND HEMATOCRIT, BLOOD
HCT: 40.4 % (ref 36.0–46.0)
Hemoglobin: 13.2 g/dL (ref 12.0–15.0)

## 2013-01-16 NOTE — Patient Instructions (Addendum)
Zykera N Benally  01/16/2013   Your procedure is scheduled on:  01/17/13  Report to [facility] at 09:00 AM.  Call this number if you have problems the morning of surgery: [phone]   Remember:   Do not eat food or drink liquids after midnight.   Take these medicines the morning of surgery with A SIP OF WATER: zofran, percocet   Do not wear jewelry, make-up or nail polish.  Do not wear lotions, powders, or perfumes.   Do not shave 48 hours prior to surgery. Men may shave face and neck.  Do not bring valuables to the hospital.  [facility] is not responsible for any belongings or valuables.               Contacts, dentures or bridgework may not be worn into surgery.  Leave suitcase in the car. After surgery it may be brought to your room.  For patients admitted to the hospital, discharge time is determined by your treatment team.               Patients discharged the day of surgery will not be allowed to drive home.   Special Instructions: Shower using CHG 2 nights before surgery and the night before surgery.  If you shower the day of surgery use CHG.  Use special wash - you have one bottle of CHG for all showers.  You should use approximately 1/3 of the bottle for each shower.   Please read over the following fact sheets that you were given: Pain Booklet, Anesthesia Post-op Instructions and Care and Recovery After Surgery    Cystoscopy Cystoscopy is a procedure that is used to help your caregiver diagnose and sometimes treat conditions that affect your lower urinary tract. Your lower urinary tract includes your bladder and the tube through which urine passes from your bladder out of your body (urethra). Cystoscopy is performed with a thin, tube-shaped instrument (cystoscope). The cystoscope has lenses and a light at the end so that your caregiver can see inside your bladder. The cystoscope is inserted at the entrance of your urethra. Your caregiver guides it through your urethra and  into your bladder. There are two main types of cystoscopy:  Flexible cystoscopy (with a flexible cystoscope).  Rigid cystoscopy (with a rigid cystoscope). Cystoscopy may be recommended for many conditions, including:  Urinary tract infections.  Blood in your urine (hematuria).  Loss of bladder control (urinary incontinence) or overactive bladder.  Unusual cells found in a urine sample.  Urinary blockage.  Painful urination. Cystoscopy may also be done to remove a sample of your tissue to be checked under a microscope (biopsy). It may also be done to remove or destroy bladder stones. LET YOUR CAREGIVER KNOW ABOUT:  Allergies to food or medicine.  Medicines taken, including vitamins, herbs, eyedrops, over-the-counter medicines, and creams.  Use of steroids (by mouth or creams).  Previous problems with anesthetics or numbing medicines.  History of bleeding problems or blood clots.  Previous surgery.  Other health problems, including diabetes and kidney problems.  Possibility of pregnancy, if this applies. PROCEDURE The area around the opening to your urethra will be cleaned. A medicine to numb your urethra (local anesthetic) is used. If a tissue sample or stone is removed during the procedure, you may be given a medicine to make you sleep (general anesthetic). Your caregiver will gently insert the tip of the cystoscope into your urethra. The cystoscope will be slowly glided through your urethra and into  your bladder. Sterile fluid will flow through the cystoscope and into your bladder. The fluid will expand and stretch your bladder. This gives your caregiver a better view of your bladder walls. The procedure lasts about 15 20 minutes. AFTER THE PROCEDURE If a local anesthetic is used, you will be allowed to go home as soon as you are ready. If a general anesthetic is used, you will be taken to a recovery area until you are stable. You may have temporary bleeding and burning on  urination. Document Released: 01/31/2000 Document Revised: 10/28/2011 Document Reviewed: 07/27/2011 [facility] Patient Information 2014 [place], [place].    PATIENT INSTRUCTIONS POST-ANESTHESIA  IMMEDIATELY FOLLOWING SURGERY:  Do not drive or operate machinery for the first twenty four hours after surgery.  Do not make any important decisions for twenty four hours after surgery or while taking narcotic pain medications or sedatives.  If you develop intractable nausea and vomiting or a severe headache please notify your doctor immediately.  FOLLOW-UP:  Please make an appointment with your surgeon as instructed. You do not need to follow up with anesthesia unless specifically instructed to do so.  WOUND CARE INSTRUCTIONS (if applicable):  Keep a dry clean dressing on the anesthesia/puncture wound site if there is drainage.  Once the wound has quit draining you may leave it open to air.  Generally you should leave the bandage intact for twenty four hours unless there is drainage.  If the epidural site drains for more than 36-48 hours please call the anesthesia department.  QUESTIONS?:  Please feel free to call your physician or the hospital operator if you have any questions, and they will be happy to assist you.

## 2013-01-17 ENCOUNTER — Ambulatory Visit (HOSPITAL_COMMUNITY): Payer: Medicaid Other | Admitting: Anesthesiology

## 2013-01-17 ENCOUNTER — Encounter (HOSPITAL_COMMUNITY): Payer: Self-pay | Admitting: *Deleted

## 2013-01-17 ENCOUNTER — Ambulatory Visit (HOSPITAL_COMMUNITY)
Admission: RE | Admit: 2013-01-17 | Discharge: 2013-01-17 | Disposition: A | Discharge status: Home / Self Care | Payer: Medicaid Other | Source: Ambulatory Visit | Attending: Urology | Admitting: Urology

## 2013-01-17 ENCOUNTER — Encounter (HOSPITAL_COMMUNITY)
Admission: RE | Disposition: A | Discharge status: Home / Self Care | Payer: Self-pay | Source: Ambulatory Visit | Attending: Urology

## 2013-01-17 ENCOUNTER — Ambulatory Visit (HOSPITAL_COMMUNITY): Payer: Medicaid Other

## 2013-01-17 ENCOUNTER — Encounter (HOSPITAL_COMMUNITY): Payer: Medicaid Other | Admitting: Anesthesiology

## 2013-01-17 DIAGNOSIS — N135 Crossing vessel and stricture of ureter without hydronephrosis: Secondary | ICD-10-CM | POA: Insufficient documentation

## 2013-01-17 DIAGNOSIS — N1330 Unspecified hydronephrosis: Secondary | ICD-10-CM | POA: Insufficient documentation

## 2013-01-17 DIAGNOSIS — N200 Calculus of kidney: Secondary | ICD-10-CM | POA: Insufficient documentation

## 2013-01-17 HISTORY — PX: FLEXIBLE URETEROSCOPY: SHX5833

## 2013-01-17 HISTORY — PX: CYSTOSCOPY W/ URETERAL STENT PLACEMENT: SHX1429

## 2013-01-17 HISTORY — PX: BALLOON DILATION: SHX5330

## 2013-01-17 SURGERY — CYSTOSCOPY, WITH RETROGRADE PYELOGRAM AND URETERAL STENT INSERTION
Anesthesia: General | Laterality: Left

## 2013-01-17 MED ORDER — MIDAZOLAM HCL 2 MG/2ML IJ SOLN
INTRAMUSCULAR | Status: AC
  Filled 2013-01-17: qty 2

## 2013-01-17 MED ORDER — LIDOCAINE HCL (PF) 1 % IJ SOLN
INTRAMUSCULAR | Status: AC
  Filled 2013-01-17: qty 5

## 2013-01-17 MED ORDER — PROPOFOL 10 MG/ML IV BOLUS
INTRAVENOUS | Status: AC
  Filled 2013-01-17: qty 20

## 2013-01-17 MED ORDER — ONDANSETRON HCL 4 MG/2ML IJ SOLN: 4.0000 mg | Freq: Once | INTRAMUSCULAR | Status: DC | PRN

## 2013-01-17 MED ORDER — FENTANYL CITRATE 0.05 MG/ML IJ SOLN: 25.0000 ug | INTRAMUSCULAR | Status: DC | PRN

## 2013-01-17 MED ORDER — FENTANYL CITRATE 0.05 MG/ML IJ SOLN
INTRAMUSCULAR | Status: AC
  Filled 2013-01-17: qty 2

## 2013-01-17 MED ORDER — IOHEXOL 350 MG/ML SOLN
INTRAVENOUS | Status: DC | PRN
  Administered 2013-01-17: 3 mL

## 2013-01-17 MED ORDER — FENTANYL CITRATE 0.05 MG/ML IJ SOLN
INTRAMUSCULAR | Status: AC
  Filled 2013-01-17: qty 5

## 2013-01-17 MED ORDER — LACTATED RINGERS IV SOLN
INTRAVENOUS | Status: DC
  Administered 2013-01-17: 1000 via INTRAVENOUS

## 2013-01-17 MED ORDER — STERILE WATER FOR IRRIGATION IR SOLN
Status: DC | PRN
  Administered 2013-01-17: 1000 mL

## 2013-01-17 MED ORDER — ONDANSETRON HCL 4 MG/2ML IJ SOLN
INTRAMUSCULAR | Status: AC
  Administered 2013-01-17: 4 mg via INTRAVENOUS
  Filled 2013-01-17: qty 2

## 2013-01-17 MED ORDER — MIDAZOLAM HCL 2 MG/2ML IJ SOLN: 2.0000 mg | Freq: Once | INTRAMUSCULAR | Status: DC

## 2013-01-17 MED ORDER — ONDANSETRON HCL 4 MG/2ML IJ SOLN
4.0000 mg | Freq: Once | INTRAMUSCULAR | Status: AC
  Administered 2013-01-17: 4 mg via INTRAVENOUS

## 2013-01-17 MED ORDER — LIDOCAINE HCL (CARDIAC) 20 MG/ML IV SOLN
INTRAVENOUS | Status: DC | PRN
  Administered 2013-01-17: 50 mg via INTRAVENOUS

## 2013-01-17 MED ORDER — OXYCODONE-ACETAMINOPHEN 7.5-325 MG PO TABS: 1.0000 | ORAL_TABLET | Freq: Four times a day (QID) | ORAL | Status: DC | PRN

## 2013-01-17 MED ORDER — LACTATED RINGERS IV SOLN
INTRAVENOUS | Status: DC | PRN
  Administered 2013-01-17 (×2): via INTRAVENOUS

## 2013-01-17 MED ORDER — MIDAZOLAM HCL 2 MG/2ML IJ SOLN
INTRAMUSCULAR | Status: AC
  Administered 2013-01-17: 2 mg via INTRAVENOUS
  Filled 2013-01-17: qty 2

## 2013-01-17 MED ORDER — FENTANYL CITRATE 0.05 MG/ML IJ SOLN
25.0000 ug | INTRAMUSCULAR | Status: AC
  Administered 2013-01-17 (×2): 25 ug via INTRAVENOUS

## 2013-01-17 MED ORDER — FENTANYL CITRATE 0.05 MG/ML IJ SOLN
INTRAMUSCULAR | Status: DC | PRN
  Administered 2013-01-17 (×2): 25 ug via INTRAVENOUS
  Administered 2013-01-17 (×2): 50 ug via INTRAVENOUS

## 2013-01-17 MED ORDER — MIDAZOLAM HCL 2 MG/2ML IJ SOLN
1.0000 mg | INTRAMUSCULAR | Status: AC | PRN
  Administered 2013-01-17 (×4): 2 mg via INTRAVENOUS

## 2013-01-17 MED ORDER — MIDAZOLAM HCL 5 MG/5ML IJ SOLN
INTRAMUSCULAR | Status: DC | PRN
  Administered 2013-01-17: 2 mg via INTRAVENOUS

## 2013-01-17 MED ORDER — PROPOFOL 10 MG/ML IV BOLUS
INTRAVENOUS | Status: DC | PRN
  Administered 2013-01-17: 150 mg via INTRAVENOUS

## 2013-01-17 MED ORDER — ARTIFICIAL TEARS OP OINT
TOPICAL_OINTMENT | OPHTHALMIC | Status: AC
  Filled 2013-01-17: qty 3.5

## 2013-01-17 MED ORDER — SODIUM CHLORIDE 0.9 % IR SOLN
Status: DC | PRN
  Administered 2013-01-17: 3000 mL via INTRAVESICAL

## 2013-01-17 SURGICAL SUPPLY — 29 items
BAG DRAIN URO TABLE W/ADPT NS (DRAPE) ×2 IMPLANT
BAG DRN 8 ADPR NS SKTRN CSTL (DRAPE) ×1
BASKET LASER NITINOL 1.9FR (BASKET) ×1 IMPLANT
BSKT STON RTRVL 120 1.9FR (BASKET) ×1
CATH 5 FR WEDGE TIP (UROLOGICAL SUPPLIES) ×2 IMPLANT
CATH OPEN TIP 5FR (CATHETERS) ×2 IMPLANT
CLOTH BEACON ORANGE TIMEOUT ST (SAFETY) ×2 IMPLANT
DECANTER SPIKE VIAL GLASS SM (MISCELLANEOUS) ×2 IMPLANT
DILATOR UROMAX ULTRA (MISCELLANEOUS) ×1 IMPLANT
GLOVE BIO SURGEON STRL SZ7 (GLOVE) ×2 IMPLANT
GLOVE BIOGEL PI IND STRL 7.0 (GLOVE) IMPLANT
GLOVE BIOGEL PI IND STRL 7.5 (GLOVE) IMPLANT
GLOVE BIOGEL PI INDICATOR 7.0 (GLOVE) ×1
GLOVE BIOGEL PI INDICATOR 7.5 (GLOVE) ×1
GLOVE SKINSENSE NS SZ7.0 (GLOVE) ×2
GLOVE SKINSENSE STRL SZ7.0 (GLOVE) IMPLANT
GOWN STRL REIN XL XLG (GOWN DISPOSABLE) ×2 IMPLANT
GUIDEWIRE STR BENTSON 035X150 (WIRE) ×1 IMPLANT
IV NS IRRIG 3000ML ARTHROMATIC (IV SOLUTION) ×3 IMPLANT
KIT ROOM TURNOVER AP CYSTO (KITS) ×2 IMPLANT
MANIFOLD NEPTUNE II (INSTRUMENTS) ×2 IMPLANT
PACK CYSTO (CUSTOM PROCEDURE TRAY) ×2 IMPLANT
PAD ARMBOARD 7.5X6 YLW CONV (MISCELLANEOUS) ×2 IMPLANT
SET IRRIGATING DISP (SET/KITS/TRAYS/PACK) ×2 IMPLANT
STENT PERCUFLEX 4.8FRX24 (STENTS) ×1 IMPLANT
SYRINGE 10CC LL (SYRINGE) ×1 IMPLANT
TOWEL OR 17X26 4PK STRL BLUE (TOWEL DISPOSABLE) ×2 IMPLANT
WATER STERILE IRR 1000ML POUR (IV SOLUTION) ×1 IMPLANT
WIRE GUIDE BENTSON .035 15CM (WIRE) ×1 IMPLANT

## 2013-01-17 NOTE — Anesthesia Procedure Notes (Signed)
Procedure Name: LMA Insertion Date/Time: 01/17/2013 2:22 PM Performed by: Pernell Dupre, AMY A Pre-anesthesia Checklist: Patient identified, Timeout performed, Emergency Drugs available and Patient being monitored Patient Re-evaluated:Patient Re-evaluated prior to inductionOxygen Delivery Method: Circle system utilized Intubation Type: IV induction Ventilation: Mask ventilation without difficulty LMA: LMA inserted LMA Size: 4.0 Number of attempts: 1 Placement Confirmation: positive ETCO2 and breath sounds checked- equal and bilateral Tube secured with: Tape Dental Injury: Teeth and Oropharynx as per pre-operative assessment

## 2013-01-17 NOTE — Preoperative (Signed)
Beta Blockers   Reason not to administer Beta Blockers:Not Applicable 

## 2013-01-17 NOTE — OR Nursing (Signed)
Dr. Jerre Simon left to go to office  Stated he needed to sign some papers and he would be right back.

## 2013-01-17 NOTE — Anesthesia Preprocedure Evaluation (Signed)
Anesthesia Evaluation  Patient identified by MRN, date of birth, ID band Patient awake    Reviewed: Allergy & Precautions, H&P , NPO status , Patient's Chart, lab work & pertinent test results  Airway Mallampati: I TM Distance: >3 FB     Dental  (+) Teeth Intact   Pulmonary Current Smoker,  breath sounds clear to auscultation        Cardiovascular negative cardio ROS  Rhythm:Regular Rate:Normal     Neuro/Psych    GI/Hepatic negative GI ROS,   Endo/Other    Renal/GU Renal disease     Musculoskeletal   Abdominal   Peds  Hematology   Anesthesia Other Findings   Reproductive/Obstetrics                           Anesthesia Physical Anesthesia Plan  ASA: I  Anesthesia Plan: General   Post-op Pain Management:    Induction: Intravenous  Airway Management Planned: LMA  Additional Equipment:   Intra-op Plan:   Post-operative Plan: Extubation in OR  Informed Consent: I have reviewed the patients History and Physical, chart, labs and discussed the procedure including the risks, benefits and alternatives for the proposed anesthesia with the patient or authorized representative who has indicated his/her understanding and acceptance.     Plan Discussed with:   Anesthesia Plan Comments:         Anesthesia Quick Evaluation

## 2013-01-17 NOTE — Transfer of Care (Signed)
Immediate Anesthesia Transfer of Care Note  Patient: [name]  Procedure(s) Performed: Procedure(s): CYSTOSCOPY WITH LEFT RETROGRADE PYELOGRAM; LEFT URETERAL STENT PLACEMENT (Left) FLEXIBLE LEFT URETEROSCOPY; ATTEMPTED STONE BASKET EXTRACTION (Left)  Patient Location: PACU  Anesthesia Type:General  Level of Consciousness: sedated and patient cooperative  Airway & Oxygen Therapy: Patient Spontanous Breathing and Patient connected to face mask oxygen  Post-op Assessment: Report given to PACU RN and Post -op Vital signs reviewed and stable  Post vital signs: Reviewed and stable  Complications: No apparent anesthesia complications

## 2013-01-17 NOTE — Anesthesia Postprocedure Evaluation (Signed)
  Anesthesia Post-op Note  Patient: [name]  Procedure(s) Performed: Procedure(s): CYSTOSCOPY WITH LEFT RETROGRADE PYELOGRAM; LEFT URETERAL STENT PLACEMENT (Left) FLEXIBLE LEFT URETEROSCOPY; ATTEMPTED STONE BASKET EXTRACTION (Left) BALLOON DILATION OF LEFT URETERAL STRICTURE (Left)  Patient Location: PACU  Anesthesia Type:General  Level of Consciousness: sedated and patient cooperative  Airway and Oxygen Therapy: Patient Spontanous Breathing and Patient connected to face mask oxygen  Post-op Pain: none  Post-op Assessment: Post-op Vital signs reviewed, Patient's Cardiovascular Status Stable, Respiratory Function Stable, Patent Airway, No signs of Nausea or vomiting and Pain level controlled  Post-op Vital Signs: Reviewed and stable  Complications: No apparent anesthesia complications

## 2013-01-17 NOTE — Brief Op Note (Signed)
01/17/2013  3:06 PM  PATIENT:  [name]  32 y.o. female  PRE-OPERATIVE DIAGNOSIS:  left ureteral calculus causing severe hydroureteronephrosis  POST-OPERATIVE DIAGNOSIS:  left ureteral calculus causing severe hydroureteronephrosis  PROCEDURE:  Procedure(s): CYSTOSCOPY WITH LEFT RETROGRADE PYELOGRAM; LEFT URETERAL STENT PLACEMENT (Left) FLEXIBLE LEFT URETEROSCOPY; ATTEMPTED STONE BASKET EXTRACTION (Left)  SURGEON:  Surgeon(s) and Role:    * Ky Barban, MD - Primary  PHYSICIAN ASSISTANT:   ASSISTANTS: none   ANESTHESIA:   general  EBL:  Total I/O In: 1000 [I.V.:1000] Out: -   BLOOD ADMINISTERED:none  DRAINS: double j stent no string    LOCAL MEDICATIONS USED:  NONE  SPECIMEN:  No Specimen  DISPOSITION OF SPECIMEN:  N/A  COUNTS:  YES  TOURNIQUET:  * No tourniquets in log *  DICTATION: .Other Dictation: Dictation Number dictated  PLAN OF CARE: Discharge to home after PACU  PATIENT DISPOSITION:  PACU - hemodynamically stable.   Delay start of Pharmacological VTE agent (>24hrs) due to surgical blood loss or risk of bleeding: no

## 2013-01-17 NOTE — H&P (Signed)
[name], CERROS              ACCOUNT NO.:  [id number]  MEDICAL RECORD NO.:  [id number]  LOCATION:  [facility]                          FACILITY:  [facility]  PHYSICIAN:  Ky Barban, M.D.DATE OF BIRTH:  04/19/1980  DATE OF ADMISSION:  01/17/2013 DATE OF DISCHARGE:  LH                             HISTORY & PHYSICAL   CHIEF COMPLAINT:  __________ of left renal polyp.  HISTORY OF PRESENT ILLNESS:  This patient is well known to me. __________ having severe pain in the left flank and went to the emergency room on __________ of this month.  CT scan showed there are 5 mm and 3 mm stones causing no obstruction in the right kidney.  Also, small calculi in the left kidney causing no obstruction, but there is 1 mm stone in the left ureteropelvic junction causing severe hydronephrosis.  I think that is the stone which is giving her the pain. She is __________ so I will put a double-J stent.  For 1 mm stone, I will see if I can basket that, if not then I can leave it alone and put a double-J stent, may be dilate her UPJ and see what happens.  Couple of years ago, she had a large stone in the left kidney and undergone percutaneous laser lithotripsy in [facility].  I do not know if she has UPJ obstruction congenital or what, but anyway after the stone comes out, we will go ahead and work her up to make sure she does not have UPJ obstruction on the left side.  She understands the __________ procedural limitations, complications of stone basket, urethral perforation leading to open surgery, __________.  PAST MEDICAL HISTORY:  She has bilateral renal calculi and __________. She is doing well I think considering her problems.  There is no history of diabetes or hypertension.  PERSONAL HISTORY:  She does not smoke or drink.  REVIEW OF SYSTEMS:  Unremarkable.  PHYSICAL EXAMINATION:  VITAL SIGNS:  Blood pressure 120/80, temperature is normal. CENTRAL NERVOUS SYSTEM:  No gross neurological  deficit. HEAD, NECK, EYES, AND ENT:  Negative. CHEST:  Symmetrical.  Normal breath sounds. HEART:  Regular sinus rhythm. ABDOMEN:  Soft, flat.  Liver, spleen, kidneys are not palpable.  There are scars in the left flank from previous percutaneous laser lithotripsy.  No CVA tenderness. PELVIC:  Deferred. EXTREMITIES:  Normal.  IMPRESSION:  1 mm stone in the left ureteropelvic junction causing severe hydroureteronephrosis.  PLAN:  Cystoscopy, left retrograde pyelogram, and possible stone basket under anesthesia as outpatient and insertion of double-J stent. Procedure risk, limitation, and complications have been discussed in detail with the patient.     Ky Barban, M.D.     MIJ/MEDQ  D:  01/16/2013  T:  01/17/2013  Job:  [id number]

## 2013-01-17 NOTE — Op Note (Signed)
dictated

## 2013-01-19 NOTE — Op Note (Signed)
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  PROCEDURES:  Cystoscopy, left retrograde pyelogram, ureteroscopic stone basket attempt, dilation of ureteral stricture.  Inserted a double-J stent size 5-French 24 cm, no string attached.  ANESTHESIA:  General endotracheal.  DESCRIPTION OF PROCEDURE:  Under general anesthesia in lithotomy position, after usual prep and drape, a #25 cystoscope introduced into the bladder.  A wedge catheter was introduced into the left ureteral orifice.  Hypaque was injected under fluoroscopic control.  Dye goes up into the renal pelvis.  The ureter looks normal caliber.  There was narrow area at the UPJ, but I did not see any filling defect.  The renal pelvis was markedly dilated.  I passed a Glidewire up into the renal pelvis, which went in very easily.  After inserting the Glidewire, I was able to insert open-end catheter #4-French into the renal pelvis, removed the Glidewire.  Hydronephrotic drip was obtained.  I injected 1 mL of Hypaque again to make sure that I am in the right place.  Now, I put the Bentson guidewire again and over the guidewire, after removing the open-ended catheter, I put flexible ureteroscope, which went up into the renal pelvis without any problem, and after I had dilated the upper ureter with balloon dilated #15, I introduced the flexible ureteroscope, inspected the renal pelvis looks normal.  I could not find the 1 mm stone, so decided to leave the double-J stent in, so the ureteroscope was removed after inserting a guidewire and after  removing the ureteroscope, I introduced a double-J stent through the cystoscope along with the guidewire and advanced the double-J stent over the guidewire with the help of the fluoroscope and nice loop was obtained in the renal pelvis.  Nice loop in the bladder after removing the guidewire.  All the instruments were removed.  The patient left the operating room in satisfactory condition.     Ky Barban, M.D.     MIJ/MEDQ  D:  01/17/2013  T:  01/18/2013  Job:  [id number]

## 2013-01-20 ENCOUNTER — Encounter (HOSPITAL_COMMUNITY): Payer: Self-pay | Admitting: Urology

## 2013-01-25 ENCOUNTER — Other Ambulatory Visit (HOSPITAL_COMMUNITY): Payer: Self-pay | Admitting: Urology

## 2013-01-25 DIAGNOSIS — N200 Calculus of kidney: Secondary | ICD-10-CM

## 2013-02-01 ENCOUNTER — Ambulatory Visit (HOSPITAL_COMMUNITY)
Admission: RE | Admit: 2013-02-01 | Discharge: 2013-02-01 | Disposition: A | Discharge status: Home / Self Care | Payer: Medicaid Other | Source: Ambulatory Visit | Attending: Urology | Admitting: Urology

## 2013-02-01 DIAGNOSIS — N200 Calculus of kidney: Secondary | ICD-10-CM

## 2013-02-08 NOTE — Op Note (Signed)
[name], [name]              ACCOUNT NO.:  [id number]  MEDICAL RECORD NO.:  [id number]  LOCATION:  XRAY                          FACILITY:  [facility]  PHYSICIAN:  Ky Barban, M.D.DATE OF BIRTH:  07/25/1980  DATE OF PROCEDURE:  01/17/2013 DATE OF DISCHARGE:  02/01/2013                              OPERATIVE REPORT   PREOPERATIVE DIAGNOSES:  Left hydronephrosis and left UPJ calculus.  PROCEDURE:  Cystoscopy dilation of UPJ and insertion of double-J stent size 5-French 24 cm.  ANESTHESIA:  General endotracheal.  DESCRIPTION OF PROCEDURE:  The patient underwent general endotracheal anesthesia in lithotomy position.  After usual prep and drape, #25 cystoscope was introduced into the bladder.  Left ureteral orifice was catheterized with a wedge catheter Hypaque was injected under fluoroscopic control.  The dye goes up into the upper ureter.  There was a significant hydronephrosis of the left kidney, but  I see a tiny filling defect in the UPJ.  A guidewire was passed up into the renal pelvis and then over the guidewire, I introduced open-end catheter, which invaded and removed the guidewire.  I did obtained a hydronephrotic drip.  Urine was collected for culture.  The stone was probably pushed away from the UPJ, it was a tiny 1 mm stone on the CT scan.  A balloon dilator #15 was introduced over the guidewire.  After obtaining the hydronephrotic drip, I reintroduced the guidewire through the open-end catheter and then balloon dilator #15 was introduced into the left UPJ and it was inflated, removed the balloon dilator and guidewire is in place.  Over the guidewire, I inserted a 5-French 24 cm double-J stent under fluoroscopic control.  The string has been removed. Stent was positioned between the renal pelvis and the bladder.  Nice loop was obtained in the renal pelvis and the bladder.  All the instruments were removed.  The patient left the operating room in satisfactory  condition.     Ky Barban, M.D.     MIJ/MEDQ  D:  02/07/2013  T:  02/07/2013  Job:  [id number]

## 2013-03-08 ENCOUNTER — Other Ambulatory Visit (HOSPITAL_COMMUNITY): Payer: Self-pay | Admitting: Urology

## 2013-03-08 DIAGNOSIS — R109 Unspecified abdominal pain: Secondary | ICD-10-CM

## 2013-03-13 ENCOUNTER — Ambulatory Visit (HOSPITAL_COMMUNITY)
Admission: RE | Admit: 2013-03-13 | Discharge: 2013-03-13 | Disposition: A | Discharge status: Home / Self Care | Payer: Medicaid Other | Source: Ambulatory Visit | Attending: Urology | Admitting: Urology

## 2013-03-13 DIAGNOSIS — R1032 Left lower quadrant pain: Secondary | ICD-10-CM | POA: Insufficient documentation

## 2013-03-13 DIAGNOSIS — R9389 Abnormal findings on diagnostic imaging of other specified body structures: Secondary | ICD-10-CM | POA: Insufficient documentation

## 2013-03-13 DIAGNOSIS — N1330 Unspecified hydronephrosis: Secondary | ICD-10-CM | POA: Insufficient documentation

## 2013-03-13 DIAGNOSIS — R109 Unspecified abdominal pain: Secondary | ICD-10-CM

## 2013-04-26 ENCOUNTER — Other Ambulatory Visit (HOSPITAL_COMMUNITY): Payer: Self-pay | Admitting: Urology

## 2013-04-26 DIAGNOSIS — N23 Unspecified renal colic: Secondary | ICD-10-CM

## 2013-05-01 ENCOUNTER — Encounter (HOSPITAL_COMMUNITY): Payer: Self-pay

## 2013-05-01 ENCOUNTER — Ambulatory Visit (HOSPITAL_COMMUNITY)
Admission: RE | Admit: 2013-05-01 | Discharge: 2013-05-01 | Disposition: A | Discharge status: Home / Self Care | Payer: Medicaid Other | Source: Ambulatory Visit | Attending: Urology | Admitting: Urology

## 2013-05-01 DIAGNOSIS — N269 Renal sclerosis, unspecified: Secondary | ICD-10-CM | POA: Diagnosis not present

## 2013-05-01 DIAGNOSIS — N23 Unspecified renal colic: Secondary | ICD-10-CM

## 2013-05-01 DIAGNOSIS — N200 Calculus of kidney: Secondary | ICD-10-CM | POA: Diagnosis not present

## 2013-05-01 MED ORDER — IOHEXOL 300 MG/ML  SOLN
100.0000 mL | Freq: Once | INTRAMUSCULAR | Status: AC | PRN
  Administered 2013-05-01: 100 mL via INTRAVENOUS

## 2013-06-14 ENCOUNTER — Other Ambulatory Visit (HOSPITAL_COMMUNITY): Payer: Self-pay | Admitting: Urology

## 2013-06-14 DIAGNOSIS — N200 Calculus of kidney: Secondary | ICD-10-CM

## 2013-06-19 ENCOUNTER — Ambulatory Visit (HOSPITAL_COMMUNITY)
Admission: RE | Admit: 2013-06-19 | Discharge: 2013-06-19 | Disposition: A | Discharge status: Home / Self Care | Payer: Medicaid Other | Source: Ambulatory Visit | Attending: Urology | Admitting: Urology

## 2013-06-19 DIAGNOSIS — N269 Renal sclerosis, unspecified: Secondary | ICD-10-CM | POA: Insufficient documentation

## 2013-06-19 DIAGNOSIS — N1330 Unspecified hydronephrosis: Secondary | ICD-10-CM | POA: Diagnosis not present

## 2013-06-19 DIAGNOSIS — R109 Unspecified abdominal pain: Secondary | ICD-10-CM | POA: Diagnosis not present

## 2013-06-19 DIAGNOSIS — N200 Calculus of kidney: Secondary | ICD-10-CM | POA: Insufficient documentation

## 2013-08-14 DIAGNOSIS — N261 Atrophy of kidney (terminal): Secondary | ICD-10-CM | POA: Insufficient documentation

## 2014-08-22 ENCOUNTER — Emergency Department (HOSPITAL_COMMUNITY): Payer: Medicaid Other

## 2014-08-22 ENCOUNTER — Emergency Department (HOSPITAL_COMMUNITY)
Admission: EM | Admit: 2014-08-22 | Discharge: 2014-08-22 | Disposition: A | Discharge status: Home / Self Care | Payer: Medicaid Other | Attending: Emergency Medicine | Admitting: Emergency Medicine

## 2014-08-22 ENCOUNTER — Encounter (HOSPITAL_COMMUNITY): Payer: Self-pay | Admitting: Emergency Medicine

## 2014-08-22 DIAGNOSIS — Z87442 Personal history of urinary calculi: Secondary | ICD-10-CM | POA: Insufficient documentation

## 2014-08-22 DIAGNOSIS — M25572 Pain in left ankle and joints of left foot: Secondary | ICD-10-CM | POA: Diagnosis present

## 2014-08-22 DIAGNOSIS — Z720 Tobacco use: Secondary | ICD-10-CM | POA: Diagnosis not present

## 2014-08-22 DIAGNOSIS — Z87440 Personal history of urinary (tract) infections: Secondary | ICD-10-CM | POA: Insufficient documentation

## 2014-08-22 MED ORDER — IBUPROFEN 600 MG PO TABS: 600.0000 mg | ORAL_TABLET | Freq: Four times a day (QID) | ORAL | Status: DC

## 2014-08-22 MED ORDER — ONDANSETRON HCL 4 MG PO TABS
4.0000 mg | ORAL_TABLET | Freq: Once | ORAL | Status: AC
  Administered 2014-08-22: 4 mg via ORAL
  Filled 2014-08-22: qty 1

## 2014-08-22 MED ORDER — IBUPROFEN 800 MG PO TABS
800.0000 mg | ORAL_TABLET | Freq: Once | ORAL | Status: AC
  Administered 2014-08-22: 800 mg via ORAL
  Filled 2014-08-22: qty 1

## 2014-08-22 MED ORDER — HYDROCODONE-ACETAMINOPHEN 5-325 MG PO TABS
1.0000 | ORAL_TABLET | Freq: Once | ORAL | Status: AC
  Administered 2014-08-22: 1 via ORAL
  Filled 2014-08-22: qty 1

## 2014-08-22 MED ORDER — PREDNISONE 50 MG PO TABS
60.0000 mg | ORAL_TABLET | Freq: Once | ORAL | Status: AC
Start: 2014-08-22 — End: 2014-08-22
  Administered 2014-08-22: 60 mg via ORAL
  Filled 2014-08-22 (×2): qty 1

## 2014-08-22 MED ORDER — HYDROCODONE-ACETAMINOPHEN 5-325 MG PO TABS: 1.0000 | ORAL_TABLET | ORAL | Status: DC | PRN
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
## 2014-08-22 NOTE — ED Notes (Signed)
Pt c/o left ankle pain since yesterday and denies any injury.

## 2014-08-22 NOTE — Discharge Instructions (Signed)
The x-ray of your ankle is negative for fracture or dislocation. I am concerned she may have an inflammatory attack concerning your ankle. Please keep your ankle elevated above your waist, apply ice, and use your crutches until you can safely apply weight to the affected extremity. Please use ibuprofen 600 mg with breakfast, lunch, dinner, and at bedtime. May use Norco for more severe pain. This medication may cause drowsiness, please use with caution. Please see Dr. Romeo AppleHarrison for additional evaluation and management if not improving in the next 4-5 days. Ankle Pain Ankle pain is a common symptom. The bones, cartilage, tendons, and muscles of the ankle joint perform a lot of work each day. The ankle joint holds your body weight and allows you to move around. Ankle pain can occur on either side or back of 1 or both ankles. Ankle pain may be sharp and burning or dull and aching. There may be tenderness, stiffness, redness, or warmth around the ankle. The pain occurs more often when a person walks or puts pressure on the ankle. CAUSES  There are many reasons ankle pain can develop. It is important to work with your caregiver to identify the cause since many conditions can impact the bones, cartilage, muscles, and tendons. Causes for ankle pain include:  Injury, including a break (fracture), sprain, or strain often due to a fall, sports, or a high-impact activity.  Swelling (inflammation) of a tendon (tendonitis).  Achilles tendon rupture.  Ankle instability after repeated sprains and strains.  Poor foot alignment.  Pressure on a nerve (tarsal tunnel syndrome).  Arthritis in the ankle or the lining of the ankle.  Crystal formation in the ankle (gout or pseudogout). DIAGNOSIS  A diagnosis is based on your medical history, your symptoms, results of your physical exam, and results of diagnostic tests. Diagnostic tests may include X-ray exams or a computerized magnetic scan (magnetic resonance imaging,  MRI). TREATMENT  Treatment will depend on the cause of your ankle pain and may include:  Keeping pressure off the ankle and limiting activities.  Using crutches or other walking support (a cane or brace).  Using rest, ice, compression, and elevation.  Participating in physical therapy or home exercises.  Wearing shoe inserts or special shoes.  Losing weight.  Taking medications to reduce pain or swelling or receiving an injection.  Undergoing surgery. HOME CARE INSTRUCTIONS   Only take over-the-counter or prescription medicines for pain, discomfort, or fever as directed by your caregiver.  Put ice on the injured area.  Put ice in a plastic bag.  Place a towel between your skin and the bag.  Leave the ice on for 15-20 minutes at a time, 03-04 times a day.  Keep your leg raised (elevated) when possible to lessen swelling.  Avoid activities that cause ankle pain.  Follow specific exercises as directed by your caregiver.  Record how often you have ankle pain, the location of the pain, and what it feels like. This information may be helpful to you and your caregiver.  Ask your caregiver about returning to work or sports and whether you should drive.  Follow up with your caregiver for further examination, therapy, or testing as directed. SEEK MEDICAL CARE IF:   Pain or swelling continues or worsens beyond 1 week.  You have an oral temperature above 102 F (38.9 C).  You are feeling unwell or have chills.  You are having an increasingly difficult time with walking.  You have loss of sensation or other new symptoms.  You have questions or concerns. MAKE SURE YOU:   Understand these instructions.  Will watch your condition.  Will get help right away if you are not doing well or get worse. Document Released: 07/23/2009 Document Revised: 04/27/2011 Document Reviewed: 07/23/2009 [facility] Patient Information 2015 [place], [place]. This information is not intended to  replace advice given to you by your health care provider. Make sure you discuss any questions you have with your health care provider.

## 2017-09-23 DIAGNOSIS — Z01411 Encounter for gynecological examination (general) (routine) with abnormal findings: Secondary | ICD-10-CM | POA: Diagnosis not present

## 2017-09-23 DIAGNOSIS — Z1389 Encounter for screening for other disorder: Secondary | ICD-10-CM | POA: Diagnosis not present

## 2017-09-23 DIAGNOSIS — Z0000 Encounter for general adult medical examination without abnormal findings: Secondary | ICD-10-CM | POA: Diagnosis not present

## 2017-09-23 DIAGNOSIS — Z6824 Body mass index (BMI) 24.0-24.9, adult: Secondary | ICD-10-CM | POA: Diagnosis not present

## 2017-09-23 DIAGNOSIS — I10 Essential (primary) hypertension: Secondary | ICD-10-CM | POA: Diagnosis not present

## 2017-10-11 DIAGNOSIS — I10 Essential (primary) hypertension: Secondary | ICD-10-CM | POA: Diagnosis not present

## 2017-10-11 DIAGNOSIS — Z6824 Body mass index (BMI) 24.0-24.9, adult: Secondary | ICD-10-CM | POA: Diagnosis not present

## 2017-10-11 DIAGNOSIS — L989 Disorder of the skin and subcutaneous tissue, unspecified: Secondary | ICD-10-CM | POA: Diagnosis not present

## 2018-08-25 ENCOUNTER — Other Ambulatory Visit: Payer: Self-pay

## 2018-08-25 DIAGNOSIS — Z20822 Contact with and (suspected) exposure to covid-19: Secondary | ICD-10-CM

## 2018-09-01 LAB — NOVEL CORONAVIRUS, NAA: SARS-CoV-2, NAA: NOT DETECTED

## 2018-12-17 ENCOUNTER — Emergency Department (HOSPITAL_COMMUNITY): Payer: 59

## 2018-12-17 ENCOUNTER — Other Ambulatory Visit: Payer: Self-pay

## 2018-12-17 ENCOUNTER — Emergency Department (HOSPITAL_COMMUNITY)
Admission: EM | Admit: 2018-12-17 | Discharge: 2018-12-18 | Disposition: A | Discharge status: Home / Self Care | Payer: 59 | Attending: Emergency Medicine | Admitting: Emergency Medicine

## 2018-12-17 ENCOUNTER — Encounter (HOSPITAL_COMMUNITY): Payer: Self-pay | Admitting: Emergency Medicine

## 2018-12-17 DIAGNOSIS — N131 Hydronephrosis with ureteral stricture, not elsewhere classified: Secondary | ICD-10-CM | POA: Insufficient documentation

## 2018-12-17 DIAGNOSIS — F17210 Nicotine dependence, cigarettes, uncomplicated: Secondary | ICD-10-CM | POA: Insufficient documentation

## 2018-12-17 DIAGNOSIS — R1084 Generalized abdominal pain: Secondary | ICD-10-CM | POA: Diagnosis present

## 2018-12-17 DIAGNOSIS — N201 Calculus of ureter: Secondary | ICD-10-CM

## 2018-12-17 LAB — COMPREHENSIVE METABOLIC PANEL
ALT: 14 U/L (ref 0–44)
AST: 13 U/L — ABNORMAL LOW (ref 15–41)
Albumin: 3.8 g/dL (ref 3.5–5.0)
Alkaline Phosphatase: 64 U/L (ref 38–126)
Anion gap: 6 (ref 5–15)
BUN: 15 mg/dL (ref 6–20)
CO2: 24 mmol/L (ref 22–32)
Calcium: 8.7 mg/dL — ABNORMAL LOW (ref 8.9–10.3)
Chloride: 108 mmol/L (ref 98–111)
Creatinine, Ser: 1.17 mg/dL — ABNORMAL HIGH (ref 0.44–1.00)
GFR calc Af Amer: >60 mL/min (ref >60)
GFR calc non Af Amer: 59 mL/min — ABNORMAL LOW (ref >60)
Glucose, Bld: 85 mg/dL (ref 70–99)
Potassium: 3.2 mmol/L — ABNORMAL LOW (ref 3.5–5.1)
Sodium: 138 mmol/L (ref 135–145)
Total Bilirubin: 0.5 mg/dL (ref 0.3–1.2)
Total Protein: 7.2 g/dL (ref 6.5–8.1)

## 2018-12-17 LAB — CBC
HCT: 43.3 % (ref 36.0–46.0)
Hemoglobin: 13.3 g/dL (ref 12.0–15.0)
MCH: 26.5 pg (ref 26.0–34.0)
MCHC: 30.7 g/dL (ref 30.0–36.0)
MCV: 86.4 fL (ref 80.0–100.0)
Platelets: 277 10*3/uL (ref 150–400)
RBC: 5.01 MIL/uL (ref 3.87–5.11)
RDW: 13.9 % (ref 11.5–15.5)
WBC: 11 10*3/uL — ABNORMAL HIGH (ref 4.0–10.5)
nRBC: 0 % (ref 0.0–0.2)

## 2018-12-17 LAB — URINALYSIS, ROUTINE W REFLEX MICROSCOPIC
Bacteria, UA: NONE SEEN
Bilirubin Urine: NEGATIVE
Glucose, UA: NEGATIVE mg/dL
Ketones, ur: NEGATIVE mg/dL
Leukocytes,Ua: NEGATIVE
Nitrite: NEGATIVE
Protein, ur: NEGATIVE mg/dL
Specific Gravity, Urine: 1.008 (ref 1.005–1.030)
pH: 7 (ref 5.0–8.0)

## 2018-12-17 LAB — PREGNANCY, URINE: Preg Test, Ur: NEGATIVE

## 2018-12-17 LAB — LIPASE, BLOOD: Lipase: 30 U/L (ref 11–51)

## 2018-12-17 MED ORDER — SODIUM CHLORIDE 0.9 % IV BOLUS
1000.0000 mL | Freq: Once | INTRAVENOUS | Status: AC
  Administered 2018-12-17: 1000 mL via INTRAVENOUS

## 2018-12-17 MED ORDER — ONDANSETRON HCL 4 MG/2ML IJ SOLN
4.0000 mg | Freq: Once | INTRAMUSCULAR | Status: AC
  Administered 2018-12-17: 4 mg via INTRAVENOUS
  Filled 2018-12-17: qty 2

## 2018-12-17 MED ORDER — FENTANYL CITRATE (PF) 100 MCG/2ML IJ SOLN
50.0000 ug | Freq: Once | INTRAMUSCULAR | Status: AC
  Administered 2018-12-17: 50 ug via INTRAVENOUS
  Filled 2018-12-17: qty 2

## 2018-12-17 MED ORDER — POTASSIUM CHLORIDE CRYS ER 20 MEQ PO TBCR
40.0000 meq | EXTENDED_RELEASE_TABLET | Freq: Once | ORAL | Status: AC
  Administered 2018-12-17: 40 meq via ORAL
  Filled 2018-12-17: qty 2

## 2018-12-17 NOTE — ED Triage Notes (Signed)
Pt c/o abd and flank pain that started x 2 days.

## 2018-12-18 MED ORDER — KETOROLAC TROMETHAMINE 30 MG/ML IJ SOLN
30.0000 mg | Freq: Once | INTRAMUSCULAR | Status: AC
  Administered 2018-12-18: 30 mg via INTRAVENOUS
  Filled 2018-12-18: qty 1

## 2018-12-18 MED ORDER — CLONIDINE HCL 0.2 MG PO TABS
0.2000 mg | ORAL_TABLET | Freq: Once | ORAL | Status: AC
  Administered 2018-12-18: 01:00:00 0.2 mg via ORAL
  Filled 2018-12-18: qty 1

## 2018-12-18 MED ORDER — TAMSULOSIN HCL 0.4 MG PO CAPS: ORAL_CAPSULE | ORAL | 0 refills | Status: DC

## 2018-12-18 MED ORDER — ONDANSETRON HCL 4 MG PO TABS: 4.0000 mg | ORAL_TABLET | Freq: Three times a day (TID) | ORAL | 0 refills | Status: DC | PRN

## 2018-12-18 MED ORDER — OXYCODONE-ACETAMINOPHEN 5-325 MG PO TABS: 1.0000 | ORAL_TABLET | Freq: Four times a day (QID) | ORAL | 0 refills | Status: DC | PRN

## 2018-12-18 NOTE — ED Provider Notes (Signed)
[facility] EMERGENCY DEPARTMENT Provider Note   CSN: [id number] Arrival date & time: 12/17/18  1956   Time seen 11:24 PM  History   Chief Complaint Chief Complaint  Patient presents with  . Abdominal Pain    HPI [name] is a 38 y.o. female.     HPI patient states 2 days ago she started having diffuse abdominal pain that radiates into her lower back bilaterally but she states the right back seem to be a little more painful than the left.  When I asked her if there is anything that makes her pain worse she states "how I am doing now".  She has her knees flexed.  I asked her what that means and it seems to me that movement makes it hurt more.  She states if she sits down and rests at work it feels better.  She describes the pain is aching and it comes and goes depending on her activity.  She has had nausea without vomiting, diarrhea, dysuria, vaginal discharge.  She states she has had frequency for a few days.  When I asked her if she has had this before she states "I am not saying I have or I ain't it before".  She does report a history of kidney stones frequently in the past, she cannot tell me how many episodes but she has had to have surgery and lithotripsy and cystoscopy for removal before.  She denies having any hematuria.  Patient states she was on blood pressure medication but she quit taking it and she cannot tell me how long ago.  When I go through her chart as far back as 6 years ago I do not see any blood pressure medication listed.  She does not recall what she was on.  PCP [facility]   Past Medical History:  Diagnosis Date  . Kidney stones   . UTI (urinary tract infection)     Patient Active Problem List   Diagnosis Date Noted  . Renal colic on left side 11/07/2010  . Pyelonephritis 11/07/2010  . Dehydration 11/07/2010  . Hypokalemia 11/07/2010  . Flank pain 11/04/2010  . UTI (urinary tract infection) 11/04/2010    Past Surgical History:   Procedure Laterality Date  . APPENDECTOMY    . BALLOON DILATION Left 01/17/2013   Procedure: BALLOON DILATION OF LEFT URETERAL STRICTURE;  Surgeon: Ky Barban, MD;  Location: [facility];  Service: Urology;  Laterality: Left;  . CYSTOSCOPY W/ URETERAL STENT PLACEMENT Left 01/17/2013   Procedure: CYSTOSCOPY WITH LEFT RETROGRADE PYELOGRAM; LEFT URETERAL STENT PLACEMENT;  Surgeon: Ky Barban, MD;  Location: [facility];  Service: Urology;  Laterality: Left;  . FLEXIBLE URETEROSCOPY Left 01/17/2013   Procedure: FLEXIBLE LEFT URETEROSCOPY; ATTEMPTED STONE BASKET EXTRACTION;  Surgeon: Ky Barban, MD;  Location: [facility];  Service: Urology;  Laterality: Left;  . KIDNEY SURGERY       OB History   No obstetric history on file.      Home Medications    Prior to Admission medications   Medication Sig Start Date End Date Taking? Authorizing Provider  estradiol cypionate (DEPO-ESTRADIOL) 5 MG/ML injection Inject into the muscle every 8 (eight) weeks.    [provider]  HYDROcodone-acetaminophen (NORCO/VICODIN) 5-325 MG per tablet Take 1 tablet by mouth every 4 (four) hours as needed. 08/22/14   Ivery Quale, PA-C  ibuprofen (ADVIL,MOTRIN) 600 MG tablet Take 1 tablet (600 mg total) by mouth 4 (four) times daily. 08/22/14   Beverely Pace,  [place], PA-C  ondansetron (ZOFRAN ODT) 8 MG disintegrating tablet Take 1 tablet (8 mg total) by mouth every 8 (eight) hours as needed for nausea or vomiting. 01/02/13   Azalia Bilis, MD  ondansetron (ZOFRAN) 4 MG tablet Take 1 tablet (4 mg total) by mouth every 8 (eight) hours as needed for nausea or vomiting. 12/18/18   Devoria Albe, MD  oxyCODONE-acetaminophen (PERCOCET) 5-325 MG tablet Take 1 tablet by mouth every 6 (six) hours as needed for severe pain. 12/18/18   Devoria Albe, MD  oxyCODONE-acetaminophen (PERCOCET/ROXICET) 5-325 MG per tablet Take 1 tablet by mouth every 4 (four) hours as needed for severe pain. 01/02/13   Azalia Bilis, MD  tamsulosin (FLOMAX)  0.4 MG CAPS capsule Take 1 po QD until you pass the stone. 12/18/18   Devoria Albe, MD    Family History No family history on file.  Social History Social History   Tobacco Use  . Smoking status: Current Every Day Smoker    Packs/day: 0.50    Years: 14.00    Pack years: 7.00    Types: Cigarettes  . Smokeless tobacco: Never Used  Substance Use Topics  . Alcohol use: No    Comment: occ  . Drug use: No  employed delivering mail   Allergies   Adhesive [tape] and Other   Review of Systems Review of Systems  All other systems reviewed and are negative.    Physical Exam Updated Vital Signs BP (!) 188/117   Pulse 88   Temp 98.2 F (36.8 C) (Oral)   Resp 16   Ht  (1.6 m)   Wt 69.3 kg   SpO2 93%   BMI 27.07 kg/m   Physical Exam Vitals signs and nursing note reviewed.  Constitutional:      General: She is not in acute distress.    Appearance: Normal appearance. She is well-developed. She is not ill-appearing or toxic-appearing.     Comments: Patient lying quietly on her stretcher in no apparent distress  HENT:     Head: Normocephalic and atraumatic.     Right Ear: External ear normal.     Left Ear: External ear normal.     Nose: Nose normal. No mucosal edema or rhinorrhea.     Mouth/Throat:     Mouth: Mucous membranes are dry.     Dentition: No dental abscesses.     Pharynx: No oropharyngeal exudate, posterior oropharyngeal erythema or uvula swelling.  Eyes:     General: No scleral icterus.    Extraocular Movements: Extraocular movements intact.     Conjunctiva/sclera: Conjunctivae normal.     Pupils: Pupils are equal, round, and reactive to light.  Neck:     Musculoskeletal: Full passive range of motion without pain, normal range of motion and neck supple.  Cardiovascular:     Rate and Rhythm: Normal rate and regular rhythm.     Heart sounds: Normal heart sounds. No murmur. No friction rub. No gallop.   Pulmonary:     Effort: Pulmonary effort is normal.  No respiratory distress.     Breath sounds: Normal breath sounds. No wheezing, rhonchi or rales.  Chest:     Chest wall: No tenderness or crepitus.  Abdominal:     General: Bowel sounds are normal. There is no distension.     Palpations: Abdomen is soft.     Tenderness: There is no abdominal tenderness. There is no right CVA tenderness, left CVA tenderness, guarding or rebound.  Comments: Patient has no pain when I palpate her abdomen, however she does states she feels like her worst pain is around her umbilicus.  Musculoskeletal: Normal range of motion.        General: No tenderness.       Back:     Comments: Moves all extremities well.  Area of pain noted in her back  Skin:    General: Skin is warm and dry.     Coloration: Skin is not pale.     Findings: No erythema or rash.  Neurological:     General: No focal deficit present.     Mental Status: She is alert and oriented to person, place, and time.     Cranial Nerves: No cranial nerve deficit.  Psychiatric:        Mood and Affect: Mood normal. Mood is not anxious.        Speech: Speech normal.        Behavior: Behavior normal.        Thought Content: Thought content normal.      ED Treatments / Results  Labs (all labs ordered are listed, but only abnormal results are displayed) Results for orders placed or performed during the hospital encounter of 12/17/18  Lipase, blood  Result Value Ref Range   Lipase 30 11 - 51 U/L  Comprehensive metabolic panel  Result Value Ref Range   Sodium 138 135 - 145 mmol/L   Potassium 3.2 (L) 3.5 - 5.1 mmol/L   Chloride 108 98 - 111 mmol/L   CO2 24 22 - 32 mmol/L   Glucose, Bld 85 70 - 99 mg/dL   BUN 15 6 - 20 mg/dL   Creatinine, Ser 1.61 (H) 0.44 - 1.00 mg/dL   Calcium 8.7 (L) 8.9 - 10.3 mg/dL   Total Protein 7.2 6.5 - 8.1 g/dL   Albumin 3.8 3.5 - 5.0 g/dL   AST 13 (L) 15 - 41 U/L   ALT 14 0 - 44 U/L   Alkaline Phosphatase 64 38 - 126 U/L   Total Bilirubin 0.5 0.3 - 1.2 mg/dL    GFR calc non Af Amer 59 (L) >60 mL/min   GFR calc Af Amer >60 >60 mL/min   Anion gap 6 5 - 15  CBC  Result Value Ref Range   WBC 11.0 (H) 4.0 - 10.5 K/uL   RBC 5.01 3.87 - 5.11 MIL/uL   Hemoglobin 13.3 12.0 - 15.0 g/dL   HCT 09.6 04.5 - 40.9 %   MCV 86.4 80.0 - 100.0 fL   MCH 26.5 26.0 - 34.0 pg   MCHC 30.7 30.0 - 36.0 g/dL   RDW 81.1 91.4 - 78.2 %   Platelets 277 150 - 400 K/uL   nRBC 0.0 0.0 - 0.2 %  Urinalysis, Routine w reflex microscopic  Result Value Ref Range   Color, Urine STRAW (A) YELLOW   APPearance CLEAR CLEAR   Specific Gravity, Urine 1.008 1.005 - 1.030   pH 7.0 5.0 - 8.0   Glucose, UA NEGATIVE NEGATIVE mg/dL   Hgb urine dipstick SMALL (A) NEGATIVE   Bilirubin Urine NEGATIVE NEGATIVE   Ketones, ur NEGATIVE NEGATIVE mg/dL   Protein, ur NEGATIVE NEGATIVE mg/dL   Nitrite NEGATIVE NEGATIVE   Leukocytes,Ua NEGATIVE NEGATIVE   RBC / HPF 0-5 0 - 5 RBC/hpf   WBC, UA 0-5 0 - 5 WBC/hpf   Bacteria, UA NONE SEEN NONE SEEN   Squamous Epithelial / LPF 0-5 0 - 5  Pregnancy,  urine  Result Value Ref Range   Preg Test, Ur NEGATIVE NEGATIVE   Laboratory interpretation all normal except hypokalemia, mild renal insufficiency, mild leukocytosis    EKG None  Radiology Ct Renal Stone Study  Result Date: 12/18/2018 CLINICAL DATA:  Flank pain.  Right lower quadrant abdominal pain. EXAM: CT ABDOMEN AND PELVIS WITHOUT CONTRAST TECHNIQUE: Multidetector CT imaging of the abdomen and pelvis was performed following the standard protocol without IV contrast. COMPARISON:  May 01, 2013. FINDINGS: Lower chest: There is interlobular septal thickening at the lung bases bilaterally with a few small airspace opacities, especially involving the partially visualized left lower lobe.The heart is enlarged. Hepatobiliary: The liver is normal. Normal gallbladder.There is no biliary ductal dilation. Pancreas: Normal contours without ductal dilatation. No peripancreatic fluid collection. Spleen: No  splenic laceration or hematoma. Adrenals/Urinary Tract: --Adrenal glands: No adrenal hemorrhage. --Right kidney/ureter: There is severe right-sided hydroureteronephrosis secondary to an obstructing 8 mm stone in the proximal right ureter. There are additional nonobstructing stones in the right kidney measuring up to approximately 8 mm. There is mild dilatation of the remaining portions of the right ureter beyond the obstructing stone. --Left kidney/ureter: The left kidney is atrophic with multiple nonobstructing stones primarily within the lower pole. --Urinary bladder: There is diffuse bladder wall thickening. Stomach/Bowel: --Stomach/Duodenum: No hiatal hernia or other gastric abnormality. Normal duodenal course and caliber. --Small bowel: No dilatation or inflammation. --Colon: No focal abnormality. --Appendix: Surgically absent. Vascular/Lymphatic: Normal course and caliber of the major abdominal vessels. --No retroperitoneal lymphadenopathy. --No mesenteric lymphadenopathy. --No pelvic or inguinal lymphadenopathy. Reproductive: There is a dominant 4.7 cm left ovarian cyst that appears to contain a thin internal septation on the coronal series. There is a dominant 4.6 cm right ovarian cyst that contains a thin internal septation. Other: No ascites or free air. The abdominal wall is normal. Musculoskeletal. No acute displaced fractures. IMPRESSION: 1. Severe right-sided hydroureteronephrosis secondary to an obstructing 8 mm stone in the proximal right ureter. There are additional nonobstructing stones in the lower pole the right kidney. 2. Atrophic left kidney with multiple nonobstructing stones in the lower pole. 3. Bladder wall thickening of unknown clinical significance. Correlation with urinalysis is recommended. 4. Mildly complex bilateral ovarian cysts as detailed above. A follow-up outpatient nonemergent pelvic ultrasound is recommended in 6-8 weeks for further evaluation. 5. Interlobular septal thickening  involving the lung bases, left worse than right. Findings may be secondary to volume overload or an atypical infectious process. 6. Cardiomegaly. Electronically Signed   By: Katherine Mantlehristopher  Green M.D.   On: 12/18/2018 00:57    Procedures Procedures (including critical care time)  Medications Ordered in ED Medications  sodium chloride 0.9 % bolus 1,000 mL (0 mLs Intravenous Stopped 12/18/18 0153)  potassium chloride SA (KLOR-CON) CR tablet 40 mEq (40 mEq Oral Given 12/17/18 2357)  ondansetron (ZOFRAN) injection 4 mg (4 mg Intravenous Given 12/17/18 2357)  fentaNYL (SUBLIMAZE) injection 50 mcg (50 mcg Intravenous Given 12/17/18 2357)  cloNIDine (CATAPRES) tablet 0.2 mg (0.2 mg Oral Given 12/18/18 0100)  ketorolac (TORADOL) 30 MG/ML injection 30 mg (30 mg Intravenous Given 12/18/18 0153)     Initial Impression / Assessment and Plan / ED Course  I have reviewed the triage vital signs and the nursing notes.  Pertinent labs & imaging results that were available during my care of the patient were reviewed by me and considered in my medical decision making (see chart for details).   Patient was given IV pain and nausea medication.  Her blood pressure was rechecked and was still over 200 and she was given clonidine 0.2 mg orally.  CT renal was done.     Recheck at 1:45 AM patient states her pain is better.  She states she did need anything else for pain however she was given Toradol IV because she is ready to be discharged.  We discussed follow-up with the [facility]rologist from [facility].  And we discussed reasons to return to the ED.  Final Clinical Impressions(s) / ED Diagnoses   Final diagnoses:  Right ureteral stone    ED Discharge Orders         Ordered    oxyCODONE-acetaminophen (PERCOCET) 5-325 MG tablet  Every 6 hours PRN     12/18/18 0152    ondansetron (ZOFRAN) 4 MG tablet  Every 8 hours PRN     12/18/18 0152    tamsulosin (FLOMAX) 0.4 MG CAPS capsule     12/18/18 0152          Plan  discharge  Rolland Porter, MD, Barbette Or, MD 12/18/18 [phone]

## 2018-12-18 NOTE — ED Notes (Signed)
Pt discharged during down time, unable to sign, pt expressed understanding of discharge instructions,

## 2018-12-18 NOTE — Discharge Instructions (Addendum)
Drink plenty of fluids. Take the medications as prescribed. Return to the ED if you get fever or have uncontrolled vomiting or pain.  Please call [facility] on Monday, you have a "obstructing 8 mm stone in the proximal right ureter".  They should be able to see you in the afternoon, if they cannot see you in [place] they could probably see you in [place].  You also need to follow-up with your primary care doctor for a complex cyst on your right ovary.  The radiologist recommended you get a ultrasound of your pelvis done in 6 to 8 weeks.

## 2018-12-22 ENCOUNTER — Other Ambulatory Visit: Payer: Self-pay

## 2018-12-22 ENCOUNTER — Other Ambulatory Visit: Payer: Self-pay | Admitting: Urology

## 2018-12-22 ENCOUNTER — Encounter (HOSPITAL_BASED_OUTPATIENT_CLINIC_OR_DEPARTMENT_OTHER): Payer: Self-pay | Admitting: *Deleted

## 2018-12-22 ENCOUNTER — Other Ambulatory Visit (HOSPITAL_COMMUNITY)
Admission: RE | Admit: 2018-12-22 | Discharge: 2018-12-22 | Disposition: A | Discharge status: Home / Self Care | Payer: 59 | Source: Ambulatory Visit | Attending: Urology | Admitting: Urology

## 2018-12-22 DIAGNOSIS — Z01812 Encounter for preprocedural laboratory examination: Secondary | ICD-10-CM | POA: Insufficient documentation

## 2018-12-22 DIAGNOSIS — Z20828 Contact with and (suspected) exposure to other viral communicable diseases: Secondary | ICD-10-CM | POA: Diagnosis not present

## 2018-12-22 LAB — SARS CORONAVIRUS 2 (TAT 6-24 HRS): SARS Coronavirus 2: NEGATIVE

## 2018-12-22 NOTE — Progress Notes (Signed)
Spoke w/ via phone for pre-op interview--- PT Lab needs dos----  EKG             Lab results------ CBC/ CMP/ Urine preg done 12-17-2018 in chart/ epic COVID test ------ 12-22-2018 done at [place] at ------- 0800 NPO after ------ [place] Medications to take morning of surgery ----- Flomax w/ sips of water and if needed take oxycodone/ zofran Diabetic medication ----- n/a Patient Special Instructions ----- n/a Pre-Op special Istructions ----- n/a Patient verbalized understanding of instructions that were given at this phone interview. Patient denies  chest pain, fever, cough , but does state she has shortness of breath with exertion at this phone interview.

## 2018-12-23 ENCOUNTER — Ambulatory Visit (HOSPITAL_BASED_OUTPATIENT_CLINIC_OR_DEPARTMENT_OTHER): Payer: 59 | Admitting: Anesthesiology

## 2018-12-23 ENCOUNTER — Encounter (HOSPITAL_BASED_OUTPATIENT_CLINIC_OR_DEPARTMENT_OTHER): Payer: Self-pay | Admitting: Anesthesiology

## 2018-12-23 ENCOUNTER — Encounter (HOSPITAL_BASED_OUTPATIENT_CLINIC_OR_DEPARTMENT_OTHER)
Admission: RE | Disposition: A | Discharge status: Home / Self Care | Payer: Self-pay | Source: Home / Self Care | Attending: Urology

## 2018-12-23 ENCOUNTER — Other Ambulatory Visit: Payer: Self-pay

## 2018-12-23 ENCOUNTER — Ambulatory Visit (HOSPITAL_BASED_OUTPATIENT_CLINIC_OR_DEPARTMENT_OTHER)
Admission: RE | Admit: 2018-12-23 | Discharge: 2018-12-23 | Disposition: A | Discharge status: Home / Self Care | Payer: 59 | Attending: Urology | Admitting: Urology

## 2018-12-23 DIAGNOSIS — Z87442 Personal history of urinary calculi: Secondary | ICD-10-CM | POA: Diagnosis not present

## 2018-12-23 DIAGNOSIS — N132 Hydronephrosis with renal and ureteral calculous obstruction: Secondary | ICD-10-CM | POA: Diagnosis not present

## 2018-12-23 DIAGNOSIS — I10 Essential (primary) hypertension: Secondary | ICD-10-CM | POA: Insufficient documentation

## 2018-12-23 DIAGNOSIS — F17210 Nicotine dependence, cigarettes, uncomplicated: Secondary | ICD-10-CM | POA: Insufficient documentation

## 2018-12-23 DIAGNOSIS — N261 Atrophy of kidney (terminal): Secondary | ICD-10-CM | POA: Insufficient documentation

## 2018-12-23 HISTORY — DX: Shortness of breath: R06.02

## 2018-12-23 HISTORY — DX: Frequency of micturition: R35.0

## 2018-12-23 HISTORY — DX: Calculus of kidney: N20.0

## 2018-12-23 HISTORY — PX: HOLMIUM LASER APPLICATION: SHX5852

## 2018-12-23 HISTORY — DX: Personal history of urinary calculi: Z87.442

## 2018-12-23 HISTORY — DX: Calculus of ureter: N20.1

## 2018-12-23 HISTORY — DX: Urgency of urination: R39.15

## 2018-12-23 HISTORY — DX: Essential (primary) hypertension: I10

## 2018-12-23 HISTORY — PX: CYSTOSCOPY WITH RETROGRADE PYELOGRAM, URETEROSCOPY AND STENT PLACEMENT: SHX5789

## 2018-12-23 LAB — POCT PREGNANCY, URINE: Preg Test, Ur: NEGATIVE

## 2018-12-23 SURGERY — CYSTOURETEROSCOPY, WITH RETROGRADE PYELOGRAM AND STENT INSERTION
Anesthesia: General | Site: Uterus | Laterality: Bilateral

## 2018-12-23 MED ORDER — LIDOCAINE 2% (20 MG/ML) 5 ML SYRINGE
INTRAMUSCULAR | Status: DC | PRN
  Administered 2018-12-23 (×2): 40 mg via INTRAVENOUS

## 2018-12-23 MED ORDER — MIDAZOLAM HCL 5 MG/5ML IJ SOLN
INTRAMUSCULAR | Status: DC | PRN
  Administered 2018-12-23: 2 mg via INTRAVENOUS

## 2018-12-23 MED ORDER — ONDANSETRON HCL 4 MG/2ML IJ SOLN
INTRAMUSCULAR | Status: AC
  Filled 2018-12-23: qty 2

## 2018-12-23 MED ORDER — SODIUM CHLORIDE 0.9 % IV SOLN
INTRAVENOUS | Status: DC
  Administered 2018-12-23: 50 mL/h via INTRAVENOUS
  Administered 2018-12-23: 13:00:00 via INTRAVENOUS
  Filled 2018-12-23: qty 1000

## 2018-12-23 MED ORDER — MIDAZOLAM HCL 2 MG/2ML IJ SOLN
INTRAMUSCULAR | Status: AC
  Filled 2018-12-23: qty 2

## 2018-12-23 MED ORDER — FENTANYL CITRATE (PF) 100 MCG/2ML IJ SOLN
25.0000 ug | INTRAMUSCULAR | Status: DC | PRN
  Administered 2018-12-23: 12:00:00 50 ug via INTRAVENOUS
  Filled 2018-12-23: qty 1

## 2018-12-23 MED ORDER — PROMETHAZINE HCL 25 MG/ML IJ SOLN
6.2500 mg | INTRAMUSCULAR | Status: DC | PRN
  Filled 2018-12-23: qty 1

## 2018-12-23 MED ORDER — PROPOFOL 10 MG/ML IV BOLUS
INTRAVENOUS | Status: DC | PRN
  Administered 2018-12-23: 30 mg via INTRAVENOUS
  Administered 2018-12-23: 150 mg via INTRAVENOUS

## 2018-12-23 MED ORDER — SODIUM CHLORIDE 0.9 % IV SOLN
INTRAVENOUS | Status: AC
  Filled 2018-12-23: qty 100

## 2018-12-23 MED ORDER — FENTANYL CITRATE (PF) 100 MCG/2ML IJ SOLN
INTRAMUSCULAR | Status: AC
  Filled 2018-12-23: qty 2

## 2018-12-23 MED ORDER — OXYCODONE-ACETAMINOPHEN 5-325 MG PO TABS: 1.0000 | ORAL_TABLET | Freq: Four times a day (QID) | ORAL | 0 refills | Status: DC | PRN

## 2018-12-23 MED ORDER — KETOROLAC TROMETHAMINE 10 MG PO TABS: 10.0000 mg | ORAL_TABLET | Freq: Three times a day (TID) | ORAL | 0 refills | Status: DC | PRN

## 2018-12-23 MED ORDER — EPHEDRINE 5 MG/ML INJ
INTRAVENOUS | Status: AC
  Filled 2018-12-23: qty 10

## 2018-12-23 MED ORDER — LIDOCAINE 2% (20 MG/ML) 5 ML SYRINGE
INTRAMUSCULAR | Status: AC
  Filled 2018-12-23: qty 5

## 2018-12-23 MED ORDER — OXYCODONE HCL 5 MG/5ML PO SOLN
5.0000 mg | Freq: Once | ORAL | Status: DC | PRN
  Filled 2018-12-23: qty 5

## 2018-12-23 MED ORDER — SENNOSIDES-DOCUSATE SODIUM 8.6-50 MG PO TABS: 1.0000 | ORAL_TABLET | Freq: Two times a day (BID) | ORAL | 0 refills | Status: DC

## 2018-12-23 MED ORDER — IOHEXOL 300 MG/ML  SOLN
INTRAMUSCULAR | Status: DC | PRN
  Administered 2018-12-23: 19 mL via URETHRAL

## 2018-12-23 MED ORDER — ACETAMINOPHEN 500 MG PO TABS
1000.0000 mg | ORAL_TABLET | Freq: Once | ORAL | Status: AC
  Administered 2018-12-23: 1000 mg via ORAL
  Filled 2018-12-23: qty 2

## 2018-12-23 MED ORDER — DEXAMETHASONE SODIUM PHOSPHATE 10 MG/ML IJ SOLN
INTRAMUSCULAR | Status: AC
  Filled 2018-12-23: qty 1

## 2018-12-23 MED ORDER — KETOROLAC TROMETHAMINE 30 MG/ML IJ SOLN
INTRAMUSCULAR | Status: AC
  Filled 2018-12-23: qty 1

## 2018-12-23 MED ORDER — LACTATED RINGERS IV SOLN
INTRAVENOUS | Status: DC
  Administered 2018-12-23: 1000 mL via INTRAVENOUS
  Filled 2018-12-23: qty 1000

## 2018-12-23 MED ORDER — FENTANYL CITRATE (PF) 100 MCG/2ML IJ SOLN
INTRAMUSCULAR | Status: DC | PRN
  Administered 2018-12-23: 25 ug via INTRAVENOUS
  Administered 2018-12-23: 50 ug via INTRAVENOUS
  Administered 2018-12-23: 25 ug via INTRAVENOUS

## 2018-12-23 MED ORDER — SODIUM CHLORIDE 0.9 % IR SOLN
Status: DC | PRN
  Administered 2018-12-23: 3000 mL

## 2018-12-23 MED ORDER — ACETAMINOPHEN 500 MG PO TABS
ORAL_TABLET | ORAL | Status: AC
  Filled 2018-12-23: qty 2

## 2018-12-23 MED ORDER — OXYCODONE HCL 5 MG PO TABS
5.0000 mg | ORAL_TABLET | Freq: Once | ORAL | Status: DC | PRN
  Filled 2018-12-23: qty 1

## 2018-12-23 MED ORDER — ONDANSETRON HCL 4 MG/2ML IJ SOLN
INTRAMUSCULAR | Status: DC | PRN
  Administered 2018-12-23: 4 mg via INTRAVENOUS

## 2018-12-23 MED ORDER — DEXAMETHASONE SODIUM PHOSPHATE 4 MG/ML IJ SOLN
INTRAMUSCULAR | Status: DC | PRN
  Administered 2018-12-23: 10 mg via INTRAVENOUS

## 2018-12-23 MED ORDER — PROPOFOL 10 MG/ML IV BOLUS
INTRAVENOUS | Status: AC
  Filled 2018-12-23: qty 20

## 2018-12-23 MED ORDER — CEFTRIAXONE SODIUM 1 G IJ SOLR
INTRAMUSCULAR | Status: AC
  Filled 2018-12-23: qty 10

## 2018-12-23 MED ORDER — SODIUM CHLORIDE 0.9 % IV SOLN
1.0000 g | INTRAVENOUS | Status: AC
  Administered 2018-12-23: 10:00:00 1 g via INTRAVENOUS
  Filled 2018-12-23: qty 10

## 2018-12-23 SURGICAL SUPPLY — 25 items
BAG DRAIN URO-CYSTO SKYTR STRL (DRAIN) ×4 IMPLANT
BAG DRN UROCATH (DRAIN) ×2
BASKET LASER NITINOL 1.9FR (BASKET) IMPLANT
BSKT STON RTRVL 120 1.9FR (BASKET)
CATH INTERMIT  6FR 70CM (CATHETERS) IMPLANT
CLOTH BEACON ORANGE TIMEOUT ST (SAFETY) ×4 IMPLANT
FIBER LASER FLEXIVA 365 (UROLOGICAL SUPPLIES) IMPLANT
FIBER LASER TRAC TIP (UROLOGICAL SUPPLIES) ×2 IMPLANT
GLOVE BIO SURGEON STRL SZ7.5 (GLOVE) ×4 IMPLANT
GOWN STRL REUS W/TWL LRG LVL3 (GOWN DISPOSABLE) ×4 IMPLANT
GUIDEWIRE ANG ZIPWIRE 038X150 (WIRE) ×6 IMPLANT
GUIDEWIRE STR DUAL SENSOR (WIRE) ×6 IMPLANT
IV NS 1000ML (IV SOLUTION) ×4
IV NS 1000ML BAXH (IV SOLUTION) ×2 IMPLANT
IV NS IRRIG 3000ML ARTHROMATIC (IV SOLUTION) ×6 IMPLANT
KIT TURNOVER CYSTO (KITS) ×4 IMPLANT
MANIFOLD NEPTUNE II (INSTRUMENTS) ×4 IMPLANT
NS IRRIG 500ML POUR BTL (IV SOLUTION) ×8 IMPLANT
PACK CYSTO (CUSTOM PROCEDURE TRAY) ×4 IMPLANT
SHEATH URETERAL 12FRX28CM (UROLOGICAL SUPPLIES) ×2 IMPLANT
SYR 10ML LL (SYRINGE) ×4 IMPLANT
TUBE CONNECTING 12'X1/4 (SUCTIONS) ×1
TUBE CONNECTING 12X1/4 (SUCTIONS) ×1 IMPLANT
TUBE FEEDING 8FR 16IN STR KANG (MISCELLANEOUS) ×2 IMPLANT
TUBING UROLOGY SET (TUBING) IMPLANT

## 2018-12-23 NOTE — H&P (Signed)
[name] is an 38 y.o. female.    Chief Complaint: Pre-Op 1st stage BILATERAL Ureteroscopic Stone Manipulation   HPI:   1 - Recurrent Urolithiasis -  Pre 2020 - PCNL, URS, SWL x many  12/2018 - - Rt 47mm UPJ + 50mm lower pole with large (chornic appearing) hydro and left punctate stoens by ER CT 11/2018. Cr 1.1. UA without infectious parameters.   2 -Left Atrophic Kidney - left partially atrophic kidney by CT 11/2018.   PMH otherwise unremarkable.   Today " [name] " is seen to proceed with BILATERAL 1st stage ureteroscopy with goal of stone free. No interval fevers. C19 screen negative.     Past Medical History:  Diagnosis Date  . Frequency of urination   . History of kidney stones   . Hypertension    followed by pcp  . Nephrolithiasis    CT 12-17-2018  left side nonobstructive stones, right ureter stone with obstruction  . Right ureteral stone   . SOB (shortness of breath)   . Urgency of urination     Past Surgical History:  Procedure Laterality Date  . APPENDECTOMY  age 78  . BALLOON DILATION Left 01/17/2013   Procedure: BALLOON DILATION OF LEFT URETERAL STRICTURE;  Surgeon: Ky Barban, MD;  Location: [facility];  Service: Urology;  Laterality: Left;  . CYSTOSCOPY W/ URETERAL STENT PLACEMENT Left 01/17/2013   Procedure: CYSTOSCOPY WITH LEFT RETROGRADE PYELOGRAM; LEFT URETERAL STENT PLACEMENT;  Surgeon: Ky Barban, MD;  Location: [facility];  Service: Urology;  Laterality: Left;  . CYSTOSCOPY/URETEROSCOPY/HOLMIUM LASER/STENT PLACEMENT  2008;  2010;  2011;  2012  . FLEXIBLE URETEROSCOPY Left 01/17/2013   Procedure: FLEXIBLE LEFT URETEROSCOPY; ATTEMPTED STONE BASKET EXTRACTION;  Surgeon: Ky Barban, MD;  Location: [facility];  Service: Urology;  Laterality: Left;    History reviewed. No pertinent family history. Social History:  reports that she has been smoking cigarettes. She has a 10.00 pack-year smoking history. She has never used smokeless tobacco. She  reports current alcohol use. She reports that she does not use drugs.  Allergies:  Allergies  Allergen Reactions  . Adhesive [Tape] Hives    hives  . Other Other (See Comments)    Latex band-aids cause bruising.     No medications prior to admission.    Results for orders placed or performed during the hospital encounter of 12/22/18 (from the past 48 hour(s))  SARS CORONAVIRUS 2 (TAT 6-24 HRS) Nasopharyngeal Nasopharyngeal Swab     Status: None   Collection Time: 12/22/18 10:31 AM   Specimen: Nasopharyngeal Swab  Result Value Ref Range   SARS Coronavirus 2 NEGATIVE NEGATIVE    Comment: (NOTE) SARS-CoV-2 target nucleic acids are NOT DETECTED. The SARS-CoV-2 RNA is generally detectable in upper and lower respiratory specimens during the acute phase of infection. Negative results do not preclude SARS-CoV-2 infection, do not rule out co-infections with other pathogens, and should not be used as the sole basis for treatment or other patient management decisions. Negative results must be combined with clinical observations, patient history, and epidemiological information. The expected result is Negative. Fact Sheet for Patients: HairSlick.no Fact Sheet for Healthcare Providers: quierodirigir.com This test is not yet approved or cleared by the [place] FDA and  has been authorized for detection and/or diagnosis of SARS-CoV-2 by FDA under an Emergency Use Authorization (EUA). This EUA will remain  in effect (meaning this test can be used) for the duration of the COVID-19 declaration under Section  56 4(b)(1) of the Act, 21 U.S.C. section 360bbb-3(b)(1), unless the authorization is terminated or revoked sooner. Performed at [facility], [place] [address], [place], [place] [address]    No results found.  Review of Systems  Constitutional: Negative for chills and fever.  Genitourinary: Positive for flank pain.  All  other systems reviewed and are negative.   Height 5\' 3"  (1.6 m), weight 69.4 kg, last menstrual period 12/15/2018. Physical Exam  Constitutional: She appears well-developed.  HENT:  Head: Normocephalic.  Eyes: Pupils are equal, round, and reactive to light.  Neck: Normal range of motion.  Cardiovascular: Normal rate.  Respiratory: Effort normal.  GI: Soft.  Genitourinary:    Genitourinary Comments: Prior flank scars. Mild Rt CVAT at present.    Musculoskeletal: Normal range of motion.  Neurological: She is alert.  Skin: Skin is warm.  Psychiatric: She has a normal mood and affect.     Assessment/Plan  Proceed as planned with BILATERAL ureteroscopic stone manipulation. Goal today is focus on right side and relieve obstruction of dominant kidney. Risks, benefits, alternatives, expected peri-op course discussed preivously and reiterated today.   Alexis Frock, MD 12/23/2018, 7:20 AM

## 2018-12-23 NOTE — Anesthesia Procedure Notes (Signed)
Procedure Name: LMA Insertion Date/Time: 12/23/2018 10:16 AM Performed by: Bonney Aid, CRNA Pre-anesthesia Checklist: Patient identified, Emergency Drugs available, Suction available and Patient being monitored Patient Re-evaluated:Patient Re-evaluated prior to induction Oxygen Delivery Method: Circle system utilized Preoxygenation: Pre-oxygenation with 100% oxygen Induction Type: IV induction Ventilation: Mask ventilation without difficulty LMA: LMA inserted LMA Size: 4.0 Number of attempts: 1 Airway Equipment and Method: Bite block Placement Confirmation: positive ETCO2 Tube secured with: Tape Dental Injury: Teeth and Oropharynx as per pre-operative assessment

## 2018-12-23 NOTE — Transfer of Care (Addendum)
Immediate Anesthesia Transfer of Care Note  Patient: [name]  Procedure(s) Performed: CYSTOSCOPY WITH RETROGRADE PYELOGRAM, URETEROSCOPY AND STENT PLACEMENT (Bilateral Ureter) HOLMIUM LASER APPLICATION (Bilateral Uterus)  Patient Location: PACU  Anesthesia Type:General  Level of Consciousness: drowsy  Airway & Oxygen Therapy: Patient Spontanous Breathing and Patient connected to nasal cannula oxygen  Post-op Assessment: Report given to RN  Post vital signs: Reviewed and stable  Last Vitals: 174/103, bp at baseline Vitals Value Taken Time  BP 192/107 12/23/18 1134  Temp    Pulse 99 12/23/18 1135  Resp 11 12/23/18 1135  SpO2 100 % 12/23/18 1135  Vitals shown include unvalidated device data.  Last Pain:  Vitals:   12/23/18 0915  TempSrc: Oral  PainSc: 0-No pain      Patients Stated Pain Goal: 6 (16/10/96 0454)  Complications: No apparent anesthesia complications

## 2018-12-23 NOTE — Anesthesia Postprocedure Evaluation (Signed)
Anesthesia Post Note  Patient: Makenzye N Steinmiller  Procedure(s) Performed: CYSTOSCOPY WITH RETROGRADE PYELOGRAM, URETEROSCOPY AND STENT PLACEMENT (Bilateral Ureter) HOLMIUM LASER APPLICATION (Bilateral Uterus)     Patient location during evaluation: PACU Anesthesia Type: General Level of consciousness: awake and alert and oriented Pain management: pain level controlled Vital Signs Assessment: post-procedure vital signs reviewed and stable Respiratory status: spontaneous breathing, nonlabored ventilation and respiratory function stable Cardiovascular status: blood pressure returned to baseline Postop Assessment: no apparent nausea or vomiting Anesthetic complications: no    Last Vitals:  Vitals:   12/23/18 1145 12/23/18 1200  BP: (!) 174/102 (!) 177/107  Pulse: 83 74  Resp: 14 14  Temp:    SpO2: 100% 100%    Last Pain:  Vitals:   12/23/18 0915  TempSrc: Oral  PainSc: 0-No pain                 Brennan Bailey

## 2018-12-23 NOTE — Anesthesia Preprocedure Evaluation (Addendum)
Anesthesia Evaluation  Patient identified by MRN, date of birth, ID band Patient awake    Reviewed: Allergy & Precautions, NPO status , Patient's Chart, lab work & pertinent test results  History of Anesthesia Complications Negative for: history of anesthetic complications  Airway Mallampati: II  TM Distance: >3 FB Neck ROM: Full    Dental no notable dental hx.    Pulmonary Current Smoker,    Pulmonary exam normal        Cardiovascular hypertension, Pt. on medications Normal cardiovascular exam     Neuro/Psych negative neurological ROS  negative psych ROS   GI/Hepatic negative GI ROS, Neg liver ROS,   Endo/Other  negative endocrine ROS  Renal/GU Renal stones  negative genitourinary   Musculoskeletal negative musculoskeletal ROS (+)   Abdominal   Peds  Hematology negative hematology ROS (+)   Anesthesia Other Findings Day of surgery medications reviewed with patient.  Reproductive/Obstetrics negative OB ROS                            Anesthesia Physical Anesthesia Plan  ASA: II  Anesthesia Plan: General   Post-op Pain Management:    Induction: Intravenous  PONV Risk Score and Plan: 3 and Treatment may vary due to age or medical condition, Ondansetron, Dexamethasone and Midazolam  Airway Management Planned: LMA  Additional Equipment: None  Intra-op Plan:   Post-operative Plan: Extubation in OR  Informed Consent: I have reviewed the patients History and Physical, chart, labs and discussed the procedure including the risks, benefits and alternatives for the proposed anesthesia with the patient or authorized representative who has indicated his/her understanding and acceptance.     Dental advisory given  Plan Discussed with: CRNA  Anesthesia Plan Comments:        Anesthesia Quick Evaluation

## 2018-12-23 NOTE — Discharge Instructions (Signed)

## 2018-12-23 NOTE — Brief Op Note (Signed)
12/23/2018  11:29 AM  PATIENT:  [name]  38 y.o. female  PRE-OPERATIVE DIAGNOSIS:  RIGHT GREATER THAN LEFT RENAL AND URETERAL STONES  POST-OPERATIVE DIAGNOSIS:  RIGHT GREATER THAN LEFT RENAL AND URETERAL STONES  PROCEDURE:  Procedure(s) with comments: CYSTOSCOPY WITH RETROGRADE PYELOGRAM, URETEROSCOPY AND STENT PLACEMENT (Bilateral) - 90 MINS HOLMIUM LASER APPLICATION (Bilateral)  SURGEON:  Surgeon(s) and Role:    Alexis Frock, MD - Primary  PHYSICIAN ASSISTANT:   ASSISTANTS: none   ANESTHESIA:   general  EBL:  5 mL   BLOOD ADMINISTERED:none  DRAINS: none   LOCAL MEDICATIONS USED:  NONE  SPECIMEN:  No Specimen  DISPOSITION OF SPECIMEN:  N/A  COUNTS:  YES  TOURNIQUET:  * No tourniquets in log *  DICTATION: .Other Dictation: Dictation Number [id number]  PLAN OF CARE: Discharge to home after PACU  PATIENT DISPOSITION:  PACU - hemodynamically stable.   Delay start of Pharmacological VTE agent (>24hrs) due to surgical blood loss or risk of bleeding: not applicable

## 2018-12-26 ENCOUNTER — Encounter (HOSPITAL_BASED_OUTPATIENT_CLINIC_OR_DEPARTMENT_OTHER): Payer: Self-pay | Admitting: Urology

## 2018-12-26 NOTE — Op Note (Signed)
[name], [name]:[id number] ACCOUNT [id number] DATE OF BIRTH:1980/09/09 FACILITY: [facility] LOCATION: [facility] PHYSICIAN:Darcel Frane, MD  OPERATIVE REPORT  DATE OF PROCEDURE:  12/23/2018  PREOPERATIVE DIAGNOSES:   1.  Right ureteral and right greater than left renal stones. 2.  Dominant right kidney.  PROCEDURE: 1.  Cystoscopy with bilateral pyelograms, interpretation. 2.  Bilateral first-stage ureteroscopy with laser lithotripsy. 3.  Insertion of bilateral ureteral stents, 5 x 22 Polaris, no tether  SURGEON:  Alexis Frock MD  ESTIMATED BLOOD LOSS:  Nil.  MEDICATIONS:  None.  SPECIMENS:  None.  FINDINGS: 1.  Minimal volume left intrarenal stone. 2.  Very impacted right proximal ureteral stone with severe hydroureteronephrosis above this. 3.  Resolution of right ureteropelvic junction stone . 4.  Complete resolution of left papillary tip calcifications. 5.  Successful placement of bilateral ureteral stents, proximal renal pelvis, distal end in urinary bladder.  INDICATIONS:  The patient is a pleasant but unfortunate 38 year old lady with a long history of complex urolithiasis.  She is status post multiple prior procedures, including percutaneous surgery.  She was found on workup of colicky flank pain to have a  very large right proximal ureteral stone, as well as right greater than left intrarenal stones.  She has a functionally dominant right kidney.  Fortunately, her creatinine is normalized as is her volume status.  Options were discussed for management  including recommended path of staged ureteroscopy with goal of stone free and she wished to proceed.  Informed consent was obtained and placed in the medical record.  DESCRIPTION OF PROCEDURE:  The patient being correct patient and procedure being bilateral first-stage ureteroscopic stent placement was confirmed, a procedure timeout was performed.  Intravenous antibiotics administered.  General  anesthesia induced.   The patient was placed into a low lithotomy position, sterile field was created, prepping and draping the patient's vagina, introitus and proximal thighs using iodine.  Cystourethroscopy was performed with a 21-French rigid cystoscope with offset lens.   Inspection of bladder revealed no diverticula, calcifications, papillary lesions.  The right ureteral orifice was then cannulated with a open end catheter and right pyelogram was obtained.  Right pyelogram demonstrates a single right ureter with single system right kidney.  There was a calcification turned filling defect in the proximal ureter consistent with stone.  There was minimal contrast flow above this consistent with likely significant  impaction.  A 0.038 ZIPwire was advanced, unable to be advanced proximal to the stone.  Therefore, it was coiled just distally to it, set aside as a safety wire.  Next, left retrograde pyelogram was obtained.  Left retrograde pyelogram demonstrated a single left ureter, single system left kidney.  No filling defects or narrowing noted.  A separate ZIPwire was advanced to lower pole, set aside as safety wire.  An 8-French feeding tube was placed in the urinary  bladder for pressure release and semirigid ureteroscopy performed of distal 4/5 left ureter alongside a separate Sensor working wire.  No mucosal abnormalities were found.  Next, a semirigid ureteroscopy was performed of distal right ureter alongside a  separate Sensor working wire.  At the level of the proximal ureter as expected, there was a very impacted ureteral stone.  There was very significant mucosal edema surrounding this.  Likely this has been here for some time.  Even with direct  visualization via the semirigid scope, there was inability to pass a safety wire alongside this.  As such, the Sensor wire was left in place  and the semirigid scope was exchanged for a 12/14 short length ureteral access sheath to the level of the   proximal ureter, taking exquisite care not to pass the sheath any closer than 2 cm distal to the stone and flexible digital ureteroscopy was performed using a flexible digital ureteroscope on the right side.  A Holmium laser energy was then applied  using a setting of 0.2 joules and 20 Hz, slowly escalating to 0.3 joules and 40 Hz.  Using a dusting technique, approximately 80% of the stone was ablated and a center-out approach given the lack of safety wire.  After approximately an hour of very  careful laser lithotripsy, continuity was reestablished above the stone and the ZIPwire was advanced, acting as a safety wire to the level of the upper pole.  Additional laser lithotripsy was performed and the stone finally dislodged from its ureteral  wall impaction.  Retrograde positioned into upper pole calix was further ablated.  There was severe hydronephrosis.  We achieved the goals on the right side, establishing ureteral continuity and addressing the acute obstructing stone.  The access sheath  was removed under vision and no mucosal abnormalities were found.  Similarly, a sheath was placed in the left side and flexible ureteroscopy was performed.  Holmium laser energy applied to the papillary tip calcifications.  It had been approximately 90 minutes of operative  time.  We achieved the goals of surgery, first-stage surgery today.  Given the patient's stone volume, it was clearly felt that a second-stage with the loop would be advantageous as our goal is complete stone free.  Access sheath was removed under  continuous vision.  No significant mucosal  abnormalities were found.  Next, bilateral 5 x 22 Polaris stents were placed over the remaining safety wires using fluoroscopic guidance.  Good proximal and distal curl were noted and the procedure terminated.  The patient  tolerated the procedure well.  No immediate complications.  The patient was taken to  postanesthesia care in stable condition.  Plan for  discharge home.  VN/NUANCE  D:12/23/2018 T:12/23/2018 JOB:[id number]

## 2019-01-02 ENCOUNTER — Encounter (HOSPITAL_BASED_OUTPATIENT_CLINIC_OR_DEPARTMENT_OTHER): Payer: Self-pay | Admitting: *Deleted

## 2019-01-02 ENCOUNTER — Other Ambulatory Visit: Payer: Self-pay

## 2019-01-02 NOTE — Progress Notes (Signed)
Spoke w/ via phone for pre-op interview---PT Lab needs dos----  Istat 8 and Urine preg            Lab results------  Current ekg in chart/ epic COVID test ------  01-03-2019 @ [facility] Arrive at ------- 1145 NPO after ------  MN w/ exception clear liquids until 0730 then nothing by mouth (no cream/ milk products) Medications to take morning of surgery ----- Flomax w/ sips of water and if needed take oxycodone/ zofran Diabetic medication ----- n/a Patient Special Instructions ----- n/a Pre-Op special Istructions ----- n/a Patient verbalized understanding of instructions that were given at this phone interview. Patient denies shortness of breath, chest pain, fever, cough a this phone interview.

## 2019-01-03 ENCOUNTER — Other Ambulatory Visit (HOSPITAL_COMMUNITY)
Admission: RE | Admit: 2019-01-03 | Discharge: 2019-01-03 | Disposition: A | Discharge status: Home / Self Care | Payer: 59 | Source: Ambulatory Visit | Attending: Urology | Admitting: Urology

## 2019-01-03 DIAGNOSIS — Z20828 Contact with and (suspected) exposure to other viral communicable diseases: Secondary | ICD-10-CM | POA: Insufficient documentation

## 2019-01-03 DIAGNOSIS — Z01812 Encounter for preprocedural laboratory examination: Secondary | ICD-10-CM | POA: Diagnosis not present

## 2019-01-03 LAB — SARS CORONAVIRUS 2 (TAT 6-24 HRS): SARS Coronavirus 2: NEGATIVE

## 2019-01-06 ENCOUNTER — Ambulatory Visit (HOSPITAL_BASED_OUTPATIENT_CLINIC_OR_DEPARTMENT_OTHER)
Admission: RE | Admit: 2019-01-06 | Discharge: 2019-01-06 | Disposition: A | Discharge status: Home / Self Care | Payer: 59 | Attending: Urology | Admitting: Urology

## 2019-01-06 ENCOUNTER — Ambulatory Visit (HOSPITAL_BASED_OUTPATIENT_CLINIC_OR_DEPARTMENT_OTHER): Payer: 59 | Admitting: Anesthesiology

## 2019-01-06 ENCOUNTER — Encounter (HOSPITAL_BASED_OUTPATIENT_CLINIC_OR_DEPARTMENT_OTHER): Payer: Self-pay | Admitting: Anesthesiology

## 2019-01-06 ENCOUNTER — Encounter (HOSPITAL_BASED_OUTPATIENT_CLINIC_OR_DEPARTMENT_OTHER)
Admission: RE | Disposition: A | Discharge status: Home / Self Care | Payer: Self-pay | Source: Home / Self Care | Attending: Urology

## 2019-01-06 DIAGNOSIS — N202 Calculus of kidney with calculus of ureter: Secondary | ICD-10-CM | POA: Diagnosis not present

## 2019-01-06 DIAGNOSIS — Z79899 Other long term (current) drug therapy: Secondary | ICD-10-CM | POA: Insufficient documentation

## 2019-01-06 DIAGNOSIS — I10 Essential (primary) hypertension: Secondary | ICD-10-CM | POA: Insufficient documentation

## 2019-01-06 DIAGNOSIS — F17210 Nicotine dependence, cigarettes, uncomplicated: Secondary | ICD-10-CM | POA: Insufficient documentation

## 2019-01-06 HISTORY — PX: HOLMIUM LASER APPLICATION: SHX5852

## 2019-01-06 HISTORY — PX: CYSTOSCOPY WITH RETROGRADE PYELOGRAM, URETEROSCOPY AND STENT PLACEMENT: SHX5789

## 2019-01-06 LAB — POCT I-STAT, CHEM 8
BUN: 20 mg/dL (ref 6–20)
Calcium, Ion: 1.29 mmol/L (ref 1.15–1.40)
Chloride: 103 mmol/L (ref 98–111)
Creatinine, Ser: 1 mg/dL (ref 0.44–1.00)
Glucose, Bld: 95 mg/dL (ref 70–99)
HCT: 48 % — ABNORMAL HIGH (ref 36.0–46.0)
Hemoglobin: 16.3 g/dL — ABNORMAL HIGH (ref 12.0–15.0)
Potassium: 3.8 mmol/L (ref 3.5–5.1)
Sodium: 141 mmol/L (ref 135–145)
TCO2: 26 mmol/L (ref 22–32)

## 2019-01-06 SURGERY — CYSTOURETEROSCOPY, WITH RETROGRADE PYELOGRAM AND STENT INSERTION
Anesthesia: General | Site: Pelvis | Laterality: Bilateral

## 2019-01-06 MED ORDER — FENTANYL CITRATE (PF) 100 MCG/2ML IJ SOLN
INTRAMUSCULAR | Status: DC | PRN
  Administered 2019-01-06 (×3): 50 ug via INTRAVENOUS

## 2019-01-06 MED ORDER — SODIUM CHLORIDE 0.9 % IV SOLN
INTRAVENOUS | Status: DC
  Administered 2019-01-06: 12:00:00 via INTRAVENOUS
  Filled 2019-01-06: qty 1000

## 2019-01-06 MED ORDER — LIDOCAINE 2% (20 MG/ML) 5 ML SYRINGE
INTRAMUSCULAR | Status: DC | PRN
  Administered 2019-01-06: 100 mg via INTRAVENOUS

## 2019-01-06 MED ORDER — LIDOCAINE 2% (20 MG/ML) 5 ML SYRINGE
INTRAMUSCULAR | Status: AC
  Filled 2019-01-06: qty 5

## 2019-01-06 MED ORDER — EPHEDRINE SULFATE-NACL 50-0.9 MG/10ML-% IV SOSY
PREFILLED_SYRINGE | INTRAVENOUS | Status: DC | PRN
  Administered 2019-01-06: 20 mg via INTRAVENOUS

## 2019-01-06 MED ORDER — PROPOFOL 10 MG/ML IV BOLUS
INTRAVENOUS | Status: AC
  Filled 2019-01-06: qty 40

## 2019-01-06 MED ORDER — MIDAZOLAM HCL 2 MG/2ML IJ SOLN
INTRAMUSCULAR | Status: AC
  Filled 2019-01-06: qty 2

## 2019-01-06 MED ORDER — MIDAZOLAM HCL 2 MG/2ML IJ SOLN
INTRAMUSCULAR | Status: DC | PRN
  Administered 2019-01-06: 2 mg via INTRAVENOUS

## 2019-01-06 MED ORDER — FENTANYL CITRATE (PF) 100 MCG/2ML IJ SOLN
INTRAMUSCULAR | Status: AC
  Filled 2019-01-06: qty 2

## 2019-01-06 MED ORDER — OXYCODONE-ACETAMINOPHEN 5-325 MG PO TABS: 1.0000 | ORAL_TABLET | Freq: Four times a day (QID) | ORAL | 0 refills | Status: DC | PRN

## 2019-01-06 MED ORDER — EPHEDRINE 5 MG/ML INJ
INTRAVENOUS | Status: AC
  Filled 2019-01-06: qty 10

## 2019-01-06 MED ORDER — ONDANSETRON HCL 4 MG/2ML IJ SOLN
INTRAMUSCULAR | Status: AC
  Filled 2019-01-06: qty 2

## 2019-01-06 MED ORDER — ONDANSETRON HCL 4 MG/2ML IJ SOLN
INTRAMUSCULAR | Status: DC | PRN
  Administered 2019-01-06 (×2): 4 mg via INTRAVENOUS

## 2019-01-06 MED ORDER — KETOROLAC TROMETHAMINE 30 MG/ML IJ SOLN
INTRAMUSCULAR | Status: DC | PRN
  Administered 2019-01-06: 30 mg via INTRAVENOUS

## 2019-01-06 MED ORDER — DEXAMETHASONE SODIUM PHOSPHATE 10 MG/ML IJ SOLN
INTRAMUSCULAR | Status: DC | PRN
  Administered 2019-01-06: 10 mg via INTRAVENOUS

## 2019-01-06 MED ORDER — PROPOFOL 10 MG/ML IV BOLUS
INTRAVENOUS | Status: DC | PRN
  Administered 2019-01-06: 200 mg via INTRAVENOUS

## 2019-01-06 MED ORDER — CEFTRIAXONE SODIUM 1 G IJ SOLR
INTRAMUSCULAR | Status: AC
  Filled 2019-01-06: qty 10

## 2019-01-06 MED ORDER — IOHEXOL 300 MG/ML  SOLN
INTRAMUSCULAR | Status: DC | PRN
  Administered 2019-01-06: 20 mL via URETHRAL

## 2019-01-06 MED ORDER — LACTATED RINGERS IV SOLN
INTRAVENOUS | Status: DC | PRN
  Administered 2019-01-06: 15:00:00 via INTRAVENOUS

## 2019-01-06 MED ORDER — DEXAMETHASONE SODIUM PHOSPHATE 10 MG/ML IJ SOLN
INTRAMUSCULAR | Status: AC
  Filled 2019-01-06: qty 1

## 2019-01-06 MED ORDER — CEPHALEXIN 500 MG PO CAPS: 500.0000 mg | ORAL_CAPSULE | Freq: Two times a day (BID) | ORAL | 0 refills | Status: DC

## 2019-01-06 MED ORDER — KETOROLAC TROMETHAMINE 10 MG PO TABS: 10.0000 mg | ORAL_TABLET | Freq: Three times a day (TID) | ORAL | 0 refills | Status: DC | PRN

## 2019-01-06 MED ORDER — KETOROLAC TROMETHAMINE 30 MG/ML IJ SOLN
INTRAMUSCULAR | Status: AC
  Filled 2019-01-06: qty 1

## 2019-01-06 MED ORDER — SODIUM CHLORIDE 0.9 % IV SOLN
1.0000 g | INTRAVENOUS | Status: AC
  Administered 2019-01-06: 1 g via INTRAVENOUS
  Filled 2019-01-06: qty 10

## 2019-01-06 MED ORDER — SODIUM CHLORIDE 0.9 % IV SOLN
INTRAVENOUS | Status: AC
  Filled 2019-01-06: qty 100

## 2019-01-06 SURGICAL SUPPLY — 26 items
BAG DRAIN URO-CYSTO SKYTR STRL (DRAIN) ×3 IMPLANT
BAG DRN UROCATH (DRAIN) ×1
BASKET LASER NITINOL 1.9FR (BASKET) ×2 IMPLANT
BSKT STON RTRVL 120 1.9FR (BASKET) ×1
CATH INTERMIT  6FR 70CM (CATHETERS) ×2 IMPLANT
CLOTH BEACON ORANGE TIMEOUT ST (SAFETY) ×3 IMPLANT
FIBER LASER FLEXIVA 365 (UROLOGICAL SUPPLIES) IMPLANT
FIBER LASER TRAC TIP (UROLOGICAL SUPPLIES) ×2 IMPLANT
GLOVE BIO SURGEON STRL SZ7.5 (GLOVE) ×3 IMPLANT
GOWN STRL REUS W/TWL LRG LVL3 (GOWN DISPOSABLE) ×3 IMPLANT
GUIDEWIRE ANG ZIPWIRE 038X150 (WIRE) ×3 IMPLANT
GUIDEWIRE STR DUAL SENSOR (WIRE) ×3 IMPLANT
IV NS 1000ML (IV SOLUTION)
IV NS 1000ML BAXH (IV SOLUTION) ×1 IMPLANT
IV NS IRRIG 3000ML ARTHROMATIC (IV SOLUTION) ×3 IMPLANT
KIT TURNOVER CYSTO (KITS) ×3 IMPLANT
MANIFOLD NEPTUNE II (INSTRUMENTS) ×3 IMPLANT
NS IRRIG 500ML POUR BTL (IV SOLUTION) ×4 IMPLANT
PACK CYSTO (CUSTOM PROCEDURE TRAY) ×3 IMPLANT
SHEATH URETERAL 12FRX28CM (UROLOGICAL SUPPLIES) ×2 IMPLANT
STENT POLARIS 5FRX22 (STENTS) ×2 IMPLANT
SYR 10ML LL (SYRINGE) ×3 IMPLANT
TUBE CONNECTING 12'X1/4 (SUCTIONS) ×1
TUBE CONNECTING 12X1/4 (SUCTIONS) ×1 IMPLANT
TUBE FEEDING 8FR 16IN STR KANG (MISCELLANEOUS) IMPLANT
TUBING UROLOGY SET (TUBING) ×2 IMPLANT

## 2019-01-06 NOTE — Anesthesia Preprocedure Evaluation (Signed)
Anesthesia Evaluation  Patient identified by MRN, date of birth, ID band Patient awake    Reviewed: Allergy & Precautions, NPO status , Patient's Chart, lab work & pertinent test results  Airway Mallampati: II  TM Distance: >3 FB Neck ROM: Full    Dental no notable dental hx.    Pulmonary Current Smoker,    Pulmonary exam normal breath sounds clear to auscultation       Cardiovascular hypertension, Normal cardiovascular exam Rhythm:Regular Rate:Normal     Neuro/Psych negative neurological ROS  negative psych ROS   GI/Hepatic negative GI ROS, Neg liver ROS,   Endo/Other  negative endocrine ROS  Renal/GU negative Renal ROS  negative genitourinary   Musculoskeletal negative musculoskeletal ROS (+)   Abdominal   Peds negative pediatric ROS (+)  Hematology negative hematology ROS (+)   Anesthesia Other Findings   Reproductive/Obstetrics negative OB ROS                             Anesthesia Physical Anesthesia Plan  ASA: II  Anesthesia Plan: General   Post-op Pain Management:    Induction: Intravenous  PONV Risk Score and Plan: 2 and Ondansetron, Dexamethasone and Treatment may vary due to age or medical condition  Airway Management Planned: LMA  Additional Equipment:   Intra-op Plan:   Post-operative Plan: Extubation in OR  Informed Consent: I have reviewed the patients History and Physical, chart, labs and discussed the procedure including the risks, benefits and alternatives for the proposed anesthesia with the patient or authorized representative who has indicated his/her understanding and acceptance.   Dental advisory given  Plan Discussed with: CRNA and Surgeon  Anesthesia Plan Comments:         Anesthesia Quick Evaluation  

## 2019-01-06 NOTE — Anesthesia Procedure Notes (Signed)
Procedure Name: LMA Insertion Date/Time: 01/06/2019 1:50 PM Performed by: Mechele Claude, CRNA Pre-anesthesia Checklist: Patient identified, Emergency Drugs available, Suction available and Patient being monitored Patient Re-evaluated:Patient Re-evaluated prior to induction Oxygen Delivery Method: Circle system utilized Preoxygenation: Pre-oxygenation with 100% oxygen Induction Type: IV induction Ventilation: Mask ventilation without difficulty LMA: LMA inserted LMA Size: 3.0 Number of attempts: 1 Airway Equipment and Method: Bite block Placement Confirmation: positive ETCO2 Tube secured with: Tape Dental Injury: Teeth and Oropharynx as per pre-operative assessment

## 2019-01-06 NOTE — H&P (Signed)
[name] is an 38 y.o. female.    Chief Complaint: Pre-Op BILATERAL 2nd Stage Ureteroscopic Stone Manipulation  HPI:    1 - Recurrent Urolithiasis -  Pre 2020 - PCNL, URS, SWL x many  12/2018 - - Rt 86mm UPJ + 47mm lower pole with large (chornic appearing) hydro and left punctate stoens by ER CT 11/2018. Cr 1.1. UA without infectious parameters.   12/23/2018 - Left uereteroscopic stone manipulation to stone free / Rt 1st stage ureteroscopy for Rt ureeral / bilateral renal stones.   2 -Left Atrophic Kidney - left partially atrophic kidney by CT 11/2018.   PMH otherwise unremarkable.   Today " [name] " is seen to proceed with BILATERAL 2nd stage ureteroscopy with goal of stone free. No interval fevers. C19 screen negative.     Past Medical History:  Diagnosis Date  . Frequency of urination   . History of kidney stones   . Hypertension    followed by pcp  . Nephrolithiasis    CT 12-17-2018  left side nonobstructive stones, right ureter stone with obstruction  . Right ureteral stone   . SOB (shortness of breath)   . Urgency of urination     Past Surgical History:  Procedure Laterality Date  . APPENDECTOMY  age 8  . BALLOON DILATION Left 01/17/2013   Procedure: BALLOON DILATION OF LEFT URETERAL STRICTURE;  Surgeon: Marissa Nestle, MD;  Location: [facility];  Service: Urology;  Laterality: Left;  . CYSTOSCOPY W/ URETERAL STENT PLACEMENT Left 01/17/2013   Procedure: CYSTOSCOPY WITH LEFT RETROGRADE PYELOGRAM; LEFT URETERAL STENT PLACEMENT;  Surgeon: Marissa Nestle, MD;  Location: [facility];  Service: Urology;  Laterality: Left;  . CYSTOSCOPY WITH RETROGRADE PYELOGRAM, URETEROSCOPY AND STENT PLACEMENT Bilateral 12/23/2018   Procedure: CYSTOSCOPY WITH RETROGRADE PYELOGRAM, URETEROSCOPY AND STENT PLACEMENT;  Surgeon: Alexis Frock, MD;  Location: [facility];  Service: Urology;  Laterality: Bilateral;  90 MINS  . CYSTOSCOPY/URETEROSCOPY/HOLMIUM LASER/STENT  PLACEMENT  2008;  2010;  2011;  2012  . FLEXIBLE URETEROSCOPY Left 01/17/2013   Procedure: FLEXIBLE LEFT URETEROSCOPY; ATTEMPTED STONE BASKET EXTRACTION;  Surgeon: Marissa Nestle, MD;  Location: [facility];  Service: Urology;  Laterality: Left;  . HOLMIUM LASER APPLICATION Bilateral 21/02/9415   Procedure: HOLMIUM LASER APPLICATION;  Surgeon: Alexis Frock, MD;  Location: [facility];  Service: Urology;  Laterality: Bilateral;    History reviewed. No pertinent family history. Social History:  reports that she has been smoking cigarettes. She has a 10.00 pack-year smoking history. She has never used smokeless tobacco. She reports current alcohol use. She reports that she does not use drugs.  Allergies:  Allergies  Allergen Reactions  . Adhesive [Tape] Rash    No medications prior to admission.    No results found for this or any previous visit (from the past 48 hour(s)). No results found.  Review of Systems  Constitutional: Negative.   HENT: Negative.   Genitourinary: Positive for urgency.  Skin: Negative.   All other systems reviewed and are negative.   Height 5\' 3"  (1.6 m), weight 69.4 kg, last menstrual period 01/01/2019. Physical Exam  Constitutional: She appears well-developed.  HENT:  Head: Normocephalic.  Eyes: Pupils are equal, round, and reactive to light.  Cardiovascular: Normal rate.  Respiratory: Effort normal.  GI: Soft.  Genitourinary:    Genitourinary Comments: NO CVAT   Musculoskeletal: Normal range of motion.  Neurological: She is alert.  Skin: Skin is warm.  Psychiatric: She has  a normal mood and affect.     Assessment/Plan  Proceed as planned with BILATERAL ureteroscopioc stone manipulation with goal of stone free. odays' focus is right side residual stone.   Sebastian Ache, MD 01/06/2019, 8:36 AM

## 2019-01-06 NOTE — Brief Op Note (Signed)
01/06/2019  3:13 PM  PATIENT:  [name]  38 y.o. female  PRE-OPERATIVE DIAGNOSIS:  BILATERAL RENAL AND URETERAL STONES  POST-OPERATIVE DIAGNOSIS:  BILATERAL RENAL AND URETERAL STONES  PROCEDURE:  Procedure(s) with comments: CYSTOSCOPY WITH RETROGRADE PYELOGRAM, URETEROSCOPY AND STENT PLACEMENT (Bilateral) - 90 MINS HOLMIUM LASER APPLICATION (Bilateral)  SURGEON:  Surgeon(s) and Role:    Alexis Frock, MD - Primary  PHYSICIAN ASSISTANT:   ASSISTANTS: none   ANESTHESIA:   regional  EBL:  Minimal    BLOOD ADMINISTERED:none  DRAINS: none   LOCAL MEDICATIONS USED:  NONE  SPECIMEN:  Source of Specimen:  Right renal stone fragements  DISPOSITION OF SPECIMEN:  discard  COUNTS:  YES  TOURNIQUET:  * No tourniquets in log *  DICTATION: .Other Dictation: Dictation Number  [id number]  PLAN OF CARE: Discharge to home after PACU  PATIENT DISPOSITION:  PACU - hemodynamically stable.   Delay start of Pharmacological VTE agent (>24hrs) due to surgical blood loss or risk of bleeding: yes

## 2019-01-06 NOTE — Discharge Instructions (Signed)
1 - You may have urinary urgency (bladder spasms) and bloody urine on / off with stent in place. This is normal.  2 - Remove tethered stents on Monday morning at home by pulling on strings, then blue-white plastic tubing, and discarding. There are TWO stents. Office is open Monday if any problems arise.   3 - Call MD or go to ER for fever >102, severe pain / nausea / vomiting not relieved by medications, or acute change in medical status   Post Anesthesia Home Care Instructions  Activity: Get plenty of rest for the remainder of the day. A responsible adult should stay with you for 24 hours following the procedure.  For the next 24 hours, DO NOT: -Drive a car -Paediatric nurse -Drink alcoholic beverages -Take any medication unless instructed by your physician -Make any legal decisions or sign important papers.  Meals: Start with liquid foods such as gelatin or soup. Progress to regular foods as tolerated. Avoid greasy, spicy, heavy foods. If nausea and/or vomiting occur, drink only clear liquids until the nausea and/or vomiting subsides. Call your physician if vomiting continues.  Special Instructions/Symptoms: Your throat may feel dry or sore from the anesthesia or the breathing tube placed in your throat during surgery. If this causes discomfort, gargle with warm salt water. The discomfort should disappear within 24 hours.  If you had a scopolamine patch placed behind your ear for the management of post- operative nausea and/or vomiting:  1. The medication in the patch is effective for 72 hours, after which it should be removed.  Wrap patch in a tissue and discard in the trash. Wash hands thoroughly with soap and water. 2. You may remove the patch earlier than 72 hours if you experience unpleasant side effects which may include dry mouth, dizziness or visual disturbances. 3. Avoid touching the patch. Wash your hands with soap and water after contact with the patch.

## 2019-01-06 NOTE — Transfer of Care (Signed)
Immediate Anesthesia Transfer of Care Note  Patient: Fran N Zamor  Procedure(s) Performed: Procedure(s) (LRB): CYSTOSCOPY WITH RETROGRADE PYELOGRAM, URETEROSCOPY AND STENT PLACEMENT (Bilateral) HOLMIUM LASER APPLICATION (Bilateral)  Patient Location: PACU  Anesthesia Type: General  Level of Consciousness: awake, alert  and oriented  Airway & Oxygen Therapy: Patient Spontanous Breathing and Patient connected to nasal cannula oxygen  Post-op Assessment: Report given to PACU RN and Post -op Vital signs reviewed and stable  Post vital signs: Reviewed and stable  Complications: No apparent anesthesia complications  Last Vitals:  Vitals Value Taken Time  BP    Temp    Pulse 91 01/06/19 1525  Resp 15 01/06/19 1525  SpO2 100 % 01/06/19 1525  Vitals shown include unvalidated device data.  Last Pain:  Vitals:   01/06/19 1220  TempSrc: Oral  PainSc: 0-No pain      Patients Stated Pain Goal: 7 (01/06/19 1220)

## 2019-01-06 NOTE — Anesthesia Postprocedure Evaluation (Signed)
Anesthesia Post Note  Patient: [name]  Procedure(s) Performed: CYSTOSCOPY WITH RETROGRADE PYELOGRAM, URETEROSCOPY AND STENT PLACEMENT (Bilateral Pelvis) HOLMIUM LASER APPLICATION (Bilateral Pelvis)     Patient location during evaluation: PACU Anesthesia Type: General Level of consciousness: awake and alert, oriented and patient cooperative Pain management: pain level controlled Vital Signs Assessment: post-procedure vital signs reviewed and stable Respiratory status: spontaneous breathing, nonlabored ventilation and respiratory function stable Cardiovascular status: blood pressure returned to baseline and stable Postop Assessment: no apparent nausea or vomiting Anesthetic complications: no    Last Vitals:  Vitals:   01/06/19 1530 01/06/19 1545  BP: (!) 146/77 135/72  Pulse: 91 85  Resp: 16 15  Temp:    SpO2: 100% 100%    Last Pain:  Vitals:   01/06/19 1545  TempSrc:   PainSc: 0-No pain                 Jarome Matin [name]

## 2019-01-07 NOTE — Op Note (Signed)
[name], [facility] OY:[id number] ACCOUNT [id number] DATE OF BIRTH:03/02/1980 FACILITY: [facility] LOCATION: [facility] PHYSICIAN:Loveah Like, MD  OPERATIVE REPORT  DATE OF PROCEDURE:  01/06/2019  PREOPERATIVE DIAGNOSIS:  Residual right renal stone, history of bilateral urolithiasis atrophic left kidney.  PROCEDURE: 1.  Cystoscopy, bilateral pyelograms with interpretation. 2.  Left diagnostic ureteroscopy. 3.  Exchange of left ureteral stent, 5 x 24 Polaris with tether. 4.  Right second-stage ureteroscopic stimulation with laser lithotripsy and exchange of right ureteral stent.  FINDINGS: 1.  Multifocal right intrarenal stones. 2.  No left intraluminal renal and ureteral stones. 3.  Complete resolution of all accessible stone fragments larger than one-third mm following laser lithotripsy and basket extraction on the right side. 4.  Successful replacement of bilateral ureteral stents proximal in the renal pelvis, distal end in urinary bladder with tether  INDICATIONS:  The patient is a pleasant, but unfortunate 38 year old lady with longstanding history of recurrent urolithiasis.  She is status post multiple procedures including percutaneous surgery.  She was found on workup for colicky flank pain to have  a right proximal ureteral stone in her dominant kidney.  She underwent bilateral stenting and first-stage ureteroscopy temporizing measure several weeks ago.  She now presents for second-stage procedure today with goal of stone free.  The left side was  primarily focused last time, the right side is the primary focus at this time.  Informed consent was obtained and placed in medical record.  DESCRIPTION OF PROCEDURE:  The patient was identified.  The procedure began using bilateral ureteroscopic stimulation was confirmed.  Procedure timeout was performed.  Intravenous antibiotics administered.  General LMA anesthesia induced.  The patient  was placed into a low  lithotomy position, sterile field was created by prepping the patient's vagina, introitus and proximal thighs using iodine.  Cystourethroscopy was performed with a 21-French rigid cystoscope offset lens.  Inspection of bladder  revealed no diverticula, calcifications, papillary lesions.  Distal and bilateral ureteral stents were seen.  They were sequentially grasped, brought to the level of the urethral meatus.  A 0.03 ZIPwire was advanced acting as safety wires.  Next, right  pyelogram was obtained.  The 6-French open-ended catheter and right pyelogram does show a single right ureter single system right kidney.  The wire was once again advanced as a safety wire.  Similarly, left retrograde pyelogram was obtained.  Left retrograde pyelogram does show a single left ureter and single system left kidney.  A ZIPwire was once again advanced on the left side acting as a safety wire.  An 8-French feeding tube was placed in the urinary bladder for pressure release.   Semirigid ureteroscopy performed for the entire length of the right ureter alongside a sensor working wire.  At the area of the UPJ, a stone corresponding to the retrograde positioned prior ureteral stone was clearly seen.  It was retrograde positioned  further into the right kidney.  Sensor working wire left in place.  Next, a semirigid ureteroscopy for the entire length of the left ureter alongside a separate sensor working wire and no mucosal abnormalities were found.  The semirigid scope was then  exchanged for a 12/14 short length ureteral access sheath to the level of the proximal ureter using continuous fluoroscopic guidance.  Flexible digital ureteroscopy performed of the proximal left ureter and systematic inspection of the left kidney,  including all calices x3.  There is no evidence of intraluminal stones seen whatsoever.  We verified the left side was stone  free.  Access sheath was removed under continuous vision.  No significant mucosal  abnormalities were found.  Next, the access  sheath was placed over the right sensor working wire to the level of proximal right ureter and systematic inspection was performed of the right ureter and kidney, including all calices x3.  There were 2 foci of stone noted, one being a free floating  renal pelvis stone and another being in the lower mid pole calix.  They were both repositioned via the escape basket into an upper mid calix for less acute angulation.  Both stents appeared to be too large for simple basketing.  Holmium laser energy  applied 70 setting of 0.3 joules and 20 Hz.  Using a dusting technique, approximately 80% of stone volume was dusted.  The remaining 20% stone volume was fragmented into pieces approximately 1-2 mm, which were then grasped in the escape basket, removed  and set aside.  Following this, complete resolution of all accessible stone fragments larger than one-third mm, no evidence of renal perforation.  Hemostasis appeared excellent.  The access sheath was removed on the right side using continuous  fluoroscopic guidance.  No mucosal abnormalities were found.  Finally, new bilateral Polaris stents were placed remaining safety wire using fluoroscopic guidance.  Good proximal and distal plane were noted.  Tethers were left in place and tucked.   Anesthesia terminated the procedure.  No immediate perinatal complications.  The patient was taken to postanesthesia care in stable condition with plan for discharge home and stent removal in 3 days.  PN/NUANCE  D:01/06/2019 T:01/07/2019 JOB:[id number]

## 2019-01-09 ENCOUNTER — Encounter (HOSPITAL_BASED_OUTPATIENT_CLINIC_OR_DEPARTMENT_OTHER): Payer: Self-pay | Admitting: Urology

## 2019-03-20 ENCOUNTER — Emergency Department (HOSPITAL_COMMUNITY)
Admission: EM | Admit: 2019-03-20 | Discharge: 2019-03-21 | Disposition: A | Discharge status: Home / Self Care | Payer: 59 | Attending: Emergency Medicine | Admitting: Emergency Medicine

## 2019-03-20 ENCOUNTER — Other Ambulatory Visit: Payer: Self-pay

## 2019-03-20 ENCOUNTER — Encounter (HOSPITAL_COMMUNITY): Payer: Self-pay | Admitting: Emergency Medicine

## 2019-03-20 ENCOUNTER — Emergency Department (HOSPITAL_COMMUNITY): Payer: 59

## 2019-03-20 DIAGNOSIS — F17210 Nicotine dependence, cigarettes, uncomplicated: Secondary | ICD-10-CM | POA: Diagnosis not present

## 2019-03-20 DIAGNOSIS — Z79899 Other long term (current) drug therapy: Secondary | ICD-10-CM | POA: Diagnosis not present

## 2019-03-20 DIAGNOSIS — I10 Essential (primary) hypertension: Secondary | ICD-10-CM | POA: Insufficient documentation

## 2019-03-20 DIAGNOSIS — M940 Chondrocostal junction syndrome [Tietze]: Secondary | ICD-10-CM | POA: Insufficient documentation

## 2019-03-20 DIAGNOSIS — R0789 Other chest pain: Secondary | ICD-10-CM

## 2019-03-20 LAB — BASIC METABOLIC PANEL
Anion gap: 5 (ref 5–15)
BUN: 14 mg/dL (ref 6–20)
CO2: 24 mmol/L (ref 22–32)
Calcium: 8.8 mg/dL — ABNORMAL LOW (ref 8.9–10.3)
Chloride: 109 mmol/L (ref 98–111)
Creatinine, Ser: 0.97 mg/dL (ref 0.44–1.00)
GFR calc Af Amer: >60 mL/min (ref >60)
GFR calc non Af Amer: >60 mL/min (ref >60)
Glucose, Bld: 91 mg/dL (ref 70–99)
Potassium: 3.7 mmol/L (ref 3.5–5.1)
Sodium: 138 mmol/L (ref 135–145)

## 2019-03-20 LAB — CBC
HCT: 45.5 % (ref 36.0–46.0)
Hemoglobin: 13.9 g/dL (ref 12.0–15.0)
MCH: 26.5 pg (ref 26.0–34.0)
MCHC: 30.5 g/dL (ref 30.0–36.0)
MCV: 86.7 fL (ref 80.0–100.0)
Platelets: 350 10*3/uL (ref 150–400)
RBC: 5.25 MIL/uL — ABNORMAL HIGH (ref 3.87–5.11)
RDW: 15.4 % (ref 11.5–15.5)
WBC: 14.6 10*3/uL — ABNORMAL HIGH (ref 4.0–10.5)
nRBC: 0 % (ref 0.0–0.2)

## 2019-03-20 LAB — TROPONIN I (HIGH SENSITIVITY): Troponin I (High Sensitivity): 6 ng/L (ref <18)

## 2019-03-20 MED ORDER — KETOROLAC TROMETHAMINE 30 MG/ML IJ SOLN
30.0000 mg | Freq: Once | INTRAMUSCULAR | Status: AC
  Administered 2019-03-20: 30 mg via INTRAMUSCULAR
  Filled 2019-03-20: qty 1

## 2019-03-20 MED ORDER — SODIUM CHLORIDE 0.9% FLUSH: 3.0000 mL | Freq: Once | INTRAVENOUS | Status: DC

## 2019-03-20 NOTE — ED Triage Notes (Signed)
Pt c/o right sided chest pain that started today and is worse when she takes a deep breath.

## 2019-03-20 NOTE — ED Provider Notes (Signed)
[facility] EMERGENCY DEPARTMENT Provider Note   CSN: [id number] Arrival date & time: 03/20/19  2009   Time seen 11:25 PM  History Chief Complaint  Patient presents with  . Chest Pain    [name] is a 39 y.o. female.  HPI Patient is sleeping and hard to keep away to get a history from.  She also seems to resent answering any questions.  She states she started having right-sided chest pain this morning that would come and go and last a few minutes.  When asked to describe it she states it "hurts".  She then states that sharp.  She states certain movements and taking a big deep breath make it hurt more.  She states rubbing it makes it feel better.  She denies cough, fever, shortness of breath, pain or swelling in her legs.  She also states she is having a headache and has some photosensitivity.  She denies any known injury.  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  She states she is never had this pain before.  Risk factors she smokes 2 black in miles a day, she has hypertension but not diabetes.  Family history is negative for coronary artery disease.  PCP [facility]    Past Medical History:  Diagnosis Date  . Frequency of urination   . History of kidney stones   . Hypertension    followed by pcp  . Nephrolithiasis    CT 12-17-2018  left side nonobstructive stones, right ureter stone with obstruction  . Right ureteral stone   . SOB (shortness of breath)   . Urgency of urination     Patient Active Problem List   Diagnosis Date Noted  . Renal colic on left side 11/07/2010  . Pyelonephritis 11/07/2010  . Dehydration 11/07/2010  . Hypokalemia 11/07/2010  . Flank pain 11/04/2010  . UTI (urinary tract infection) 11/04/2010    Past Surgical History:  Procedure Laterality Date  . APPENDECTOMY  age 4  . BALLOON DILATION Left 01/17/2013   Procedure: BALLOON DILATION OF LEFT URETERAL STRICTURE;  Surgeon: Ky Barban, MD;  Location: [facility];  Service: Urology;  Laterality: Left;  . CYSTOSCOPY W/ URETERAL STENT PLACEMENT Left 01/17/2013   Procedure: CYSTOSCOPY WITH LEFT RETROGRADE PYELOGRAM; LEFT URETERAL STENT PLACEMENT;  Surgeon: Ky Barban, MD;  Location: [facility];  Service: Urology;  Laterality: Left;  . CYSTOSCOPY WITH RETROGRADE PYELOGRAM, URETEROSCOPY AND STENT PLACEMENT Bilateral 12/23/2018   Procedure: CYSTOSCOPY WITH RETROGRADE PYELOGRAM, URETEROSCOPY AND STENT PLACEMENT;  Surgeon: Sebastian Ache, MD;  Location: [facility];  Service: Urology;  Laterality: Bilateral;  90 MINS  . CYSTOSCOPY WITH RETROGRADE PYELOGRAM, URETEROSCOPY AND STENT PLACEMENT Bilateral 01/06/2019   Procedure: CYSTOSCOPY WITH RETROGRADE PYELOGRAM, URETEROSCOPY AND STENT PLACEMENT;  Surgeon: Sebastian Ache, MD;  Location: [facility];  Service: Urology;  Laterality: Bilateral;  90 MINS  . CYSTOSCOPY/URETEROSCOPY/HOLMIUM LASER/STENT PLACEMENT  2008;  2010;  2011;  2012  . FLEXIBLE URETEROSCOPY Left 01/17/2013   Procedure: FLEXIBLE LEFT URETEROSCOPY; ATTEMPTED STONE BASKET EXTRACTION;  Surgeon: Ky Barban, MD;  Location: [facility];  Service: Urology;  Laterality: Left;  . HOLMIUM LASER APPLICATION Bilateral 12/23/2018   Procedure: HOLMIUM LASER APPLICATION;  Surgeon: Sebastian Ache, MD;  Location: [facility];  Service: Urology;  Laterality: Bilateral;  . HOLMIUM LASER APPLICATION Bilateral 01/06/2019   Procedure: HOLMIUM LASER APPLICATION;  Surgeon: Sebastian Ache, MD;  Location: [facility];  Service: Urology;  Laterality: Bilateral;     OB History   No obstetric history on file.     No family history on file.  Social History   Tobacco Use  . Smoking status: Current Every Day Smoker    Packs/day: 0.50    Years: 20.00    Pack years: 10.00    Types: Cigarettes  . Smokeless tobacco: Never Used  Substance Use Topics  . Alcohol use: Yes    Comment: occasional  . Drug use:  Never  employed   Home Medications Prior to Admission medications   Medication Sig Start Date End Date Taking? Authorizing Provider  cephALEXin (KEFLEX) 500 MG capsule Take 1 capsule (500 mg total) by mouth 2 (two) times daily. X 3 days to prevent infection with tethered stents in place. 01/06/19   Sebastian Ache, MD  ketorolac (TORADOL) 10 MG tablet Take 1 tablet (10 mg total) by mouth every 8 (eight) hours as needed for moderate pain. Or stent discomfort post-operatively. 01/06/19   Sebastian Ache, MD  naproxen (NAPROSYN) 500 MG tablet Take 1 po BID with food prn pain 03/21/19   Devoria Albe, MD  olmesartan-hydrochlorothiazide (BENICAR HCT) 40-25 MG tablet Take 1 tablet by mouth daily.    [provider]  ondansetron (ZOFRAN) 4 MG tablet Take 1 tablet (4 mg total) by mouth every 8 (eight) hours as needed for nausea or vomiting. 12/18/18   Devoria Albe, MD  oxyCODONE-acetaminophen (PERCOCET) 5-325 MG tablet Take 1 tablet by mouth every 6 (six) hours as needed for severe pain. Post-operatively. 01/06/19   Sebastian Ache, MD  senna-docusate (SENOKOT-S) 8.6-50 MG tablet Take 1 tablet by mouth 2 (two) times daily. While taking strongest pain meds to prevent constipation. 12/23/18   Sebastian Ache, MD  tamsulosin (FLOMAX) 0.4 MG CAPS capsule Take 1 po QD until you pass the stone. Patient taking differently: Take 0.4 mg by mouth daily. Take 1 po QD until you pass the stone. 12/18/18   Devoria Albe, MD    Allergies    Adhesive [tape]  Review of Systems   Review of Systems  All other systems reviewed and are negative.   Physical Exam Updated Vital Signs BP (!) 170/96   Pulse 71   Temp 98.4 F (36.9 C)   Resp 18   Ht 5\' 7"  (1.702 m)   Wt 69.3 kg   LMP 03/15/2019   SpO2 97%   BMI 23.92 kg/m   Physical Exam Vitals and nursing note reviewed.  Constitutional:      Appearance: Normal appearance. She is normal weight.  HENT:     Head: Normocephalic and atraumatic.     Right Ear:  External ear normal.     Left Ear: External ear normal.  Eyes:     Extraocular Movements: Extraocular movements intact.     Conjunctiva/sclera: Conjunctivae normal.     Pupils: Pupils are equal, round, and reactive to light.  Cardiovascular:     Rate and Rhythm: Normal rate and regular rhythm.     Pulses: Normal pulses.  Pulmonary:     Effort: Pulmonary effort is normal. No respiratory distress.     Breath sounds: Normal breath sounds.  Chest:     Chest wall: Tenderness present.       Comments: When I placed a stethoscope on her chest she tries to grab my hand and tells me to quit "pressing so hard".  I told her I was not pressing hard.  She has tenderness over the  right costochondral junctions that reproduces her complaints of pain. Musculoskeletal:        General: No deformity.     Cervical back: Normal range of motion and neck supple.  Skin:    General: Skin is warm and dry.  Psychiatric:        Mood and Affect: Affect is angry.        Speech: Speech is delayed.        Behavior: Behavior is slowed.     ED Results / Procedures / Treatments   Labs (all labs ordered are listed, but only abnormal results are displayed) Results for orders placed or performed during the hospital encounter of 03/20/19  Basic metabolic panel  Result Value Ref Range   Sodium 138 135 - 145 mmol/L   Potassium 3.7 3.5 - 5.1 mmol/L   Chloride 109 98 - 111 mmol/L   CO2 24 22 - 32 mmol/L   Glucose, Bld 91 70 - 99 mg/dL   BUN 14 6 - 20 mg/dL   Creatinine, Ser 2.99 0.44 - 1.00 mg/dL   Calcium 8.8 (L) 8.9 - 10.3 mg/dL   GFR calc non Af Amer >60 >60 mL/min   GFR calc Af Amer >60 >60 mL/min   Anion gap 5 5 - 15  CBC  Result Value Ref Range   WBC 14.6 (H) 4.0 - 10.5 K/uL   RBC 5.25 (H) 3.87 - 5.11 MIL/uL   Hemoglobin 13.9 12.0 - 15.0 g/dL   HCT 37.1 69.6 - 78.9 %   MCV 86.7 80.0 - 100.0 fL   MCH 26.5 26.0 - 34.0 pg   MCHC 30.5 30.0 - 36.0 g/dL   RDW 38.1 01.7 - 51.0 %   Platelets 350 150 - 400  K/uL   nRBC 0.0 0.0 - 0.2 %  Troponin I (High Sensitivity)  Result Value Ref Range   Troponin I (High Sensitivity) 6 <18 ng/L   Laboratory interpretation all normal except mild leukocytosis    EKG EKG Interpretation  Date/Time:  Monday March 20 2019 20:16:12 EST Ventricular Rate:  79 PR Interval:  164 QRS Duration: 76 QT Interval:  388 QTC Calculation: 444 R Axis:   74 Text Interpretation: Normal sinus rhythm ST & T wave abnormality, consider inferolateral ischemia Abnormal ECG No significant change was found Confirmed by Glynn Octave [phone]) on 03/20/2019 8:25:40 PM   Radiology DG Chest 2 View  Result Date: 03/20/2019 CLINICAL DATA:  Chest pain. EXAM: CHEST - 2 VIEW COMPARISON:  None. FINDINGS: The heart size and mediastinal contours are within normal limits. Both lungs are clear. The visualized skeletal structures are unremarkable. IMPRESSION: No active cardiopulmonary disease. Electronically Signed   By: Aram Candela M.D.   On: 03/20/2019 21:35    Procedures Procedures (including critical care time)  Medications Ordered in ED Medications  sodium chloride flush (NS) 0.9 % injection 3 mL (has no administration in time range)  ketorolac (TORADOL) 30 MG/ML injection 30 mg (30 mg Intramuscular Given 03/20/19 2349)    ED Course  I have reviewed the triage vital signs and the nursing notes.  Pertinent labs & imaging results that were available during my care of the patient were reviewed by me and considered in my medical decision making (see chart for details).    MDM Rules/Calculators/A&P                      Patient told me that I had awakened her when I came in the  room.  She questioned me heavily about what Toradol was, said she had never heard of it before.  However when I review her chart she has had it prescribed at least 4 times before, including in November.  I discussed that it would help with her chest wall pain and also would help with her headache.  She  also could drive home afterwards.  She has agreed.  Her blood pressure was very elevated when she arrived in ED, having nurses rechecked prior to discharge.  Patient's blood pressure is 177/101, she did not take her blood pressure medicine today.  She is advised to take her blood pressure medication when she gets home.  She was discharged home with naproxen.  Final Clinical Impression(s) / ED Diagnoses Final diagnoses:  Costochondritis  Chest wall pain    Rx / DC Orders ED Discharge Orders         Ordered    naproxen (NAPROSYN) 500 MG tablet     03/21/19 0005         Plan discharge  Rolland Porter, MD, Barbette Or, MD 03/21/19 [phone]

## 2019-03-21 LAB — TROPONIN I (HIGH SENSITIVITY): Troponin I (High Sensitivity): 6 ng/L (ref <18)

## 2019-03-21 MED ORDER — NAPROXEN 500 MG PO TABS: ORAL_TABLET | ORAL | 0 refills | Status: DC

## 2019-03-21 NOTE — Discharge Instructions (Addendum)
Use ice and heat on your chest for comfort.  Take the naproxen for your chest wall pain.  Recheck if you get fever, cough, or get short of breath. Take your blood pressure medication when you get home, your blood pressure at time of discharge was 177/101 which is high.

## 2019-03-21 NOTE — ED Notes (Signed)
Pt received discharge papers, signature pad didn't work

## 2019-03-21 NOTE — ED Notes (Signed)
Pt asleep before admin. IM med

## 2019-04-26 ENCOUNTER — Ambulatory Visit: Payer: 59 | Attending: Internal Medicine

## 2019-04-26 ENCOUNTER — Other Ambulatory Visit: Payer: Self-pay

## 2019-04-26 DIAGNOSIS — Z20822 Contact with and (suspected) exposure to covid-19: Secondary | ICD-10-CM

## 2019-04-27 LAB — NOVEL CORONAVIRUS, NAA: SARS-CoV-2, NAA: NOT DETECTED

## 2019-07-22 ENCOUNTER — Emergency Department (HOSPITAL_COMMUNITY)
Admission: EM | Admit: 2019-07-22 | Discharge: 2019-07-22 | Disposition: A | Discharge status: Home / Self Care | Payer: 59 | Attending: Emergency Medicine | Admitting: Emergency Medicine

## 2019-07-22 ENCOUNTER — Other Ambulatory Visit: Payer: Self-pay

## 2019-07-22 ENCOUNTER — Emergency Department (HOSPITAL_COMMUNITY): Payer: 59

## 2019-07-22 ENCOUNTER — Encounter (HOSPITAL_COMMUNITY): Payer: Self-pay | Admitting: Emergency Medicine

## 2019-07-22 DIAGNOSIS — S39012A Strain of muscle, fascia and tendon of lower back, initial encounter: Secondary | ICD-10-CM

## 2019-07-22 DIAGNOSIS — F17210 Nicotine dependence, cigarettes, uncomplicated: Secondary | ICD-10-CM | POA: Insufficient documentation

## 2019-07-22 DIAGNOSIS — I10 Essential (primary) hypertension: Secondary | ICD-10-CM | POA: Insufficient documentation

## 2019-07-22 DIAGNOSIS — Y999 Unspecified external cause status: Secondary | ICD-10-CM | POA: Diagnosis not present

## 2019-07-22 DIAGNOSIS — Y9389 Activity, other specified: Secondary | ICD-10-CM | POA: Insufficient documentation

## 2019-07-22 DIAGNOSIS — Y92410 Unspecified street and highway as the place of occurrence of the external cause: Secondary | ICD-10-CM | POA: Diagnosis not present

## 2019-07-22 DIAGNOSIS — M25511 Pain in right shoulder: Secondary | ICD-10-CM | POA: Insufficient documentation

## 2019-07-22 DIAGNOSIS — S3992XA Unspecified injury of lower back, initial encounter: Secondary | ICD-10-CM | POA: Diagnosis present

## 2019-07-22 DIAGNOSIS — R519 Headache, unspecified: Secondary | ICD-10-CM | POA: Insufficient documentation

## 2019-07-22 DIAGNOSIS — Z960 Presence of urogenital implants: Secondary | ICD-10-CM | POA: Diagnosis not present

## 2019-07-22 LAB — PREGNANCY, URINE: Preg Test, Ur: NEGATIVE

## 2019-07-22 MED ORDER — DICLOFENAC SODIUM 1 % EX GEL: 2.0000 g | Freq: Four times a day (QID) | CUTANEOUS | 0 refills | Status: DC | PRN

## 2019-07-22 MED ORDER — IBUPROFEN 800 MG PO TABS: 800.0000 mg | ORAL_TABLET | Freq: Three times a day (TID) | ORAL | 0 refills | Status: DC | PRN

## 2019-07-22 MED ORDER — METHOCARBAMOL 500 MG PO TABS: 500.0000 mg | ORAL_TABLET | Freq: Three times a day (TID) | ORAL | 0 refills | Status: DC | PRN

## 2019-07-22 MED ORDER — KETOROLAC TROMETHAMINE 30 MG/ML IJ SOLN
30.0000 mg | Freq: Once | INTRAMUSCULAR | Status: AC
  Administered 2019-07-22: 30 mg via INTRAMUSCULAR
  Filled 2019-07-22: qty 1

## 2019-07-22 NOTE — ED Notes (Signed)
PT is aware we need urine sample.  
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
## 2019-07-22 NOTE — Discharge Instructions (Signed)

## 2019-08-04 ENCOUNTER — Other Ambulatory Visit: Payer: Self-pay

## 2019-08-04 ENCOUNTER — Encounter: Payer: Self-pay | Admitting: Orthopedic Surgery

## 2019-08-04 ENCOUNTER — Ambulatory Visit (INDEPENDENT_AMBULATORY_CARE_PROVIDER_SITE_OTHER): Payer: 59 | Admitting: Orthopedic Surgery

## 2019-08-04 ENCOUNTER — Telehealth: Payer: Self-pay | Admitting: Orthopedic Surgery

## 2019-08-04 VITALS — BP 210/116 | HR 85 | Ht 63.0 in | Wt 141.0 lb

## 2019-08-04 DIAGNOSIS — G8929 Other chronic pain: Secondary | ICD-10-CM

## 2019-08-04 DIAGNOSIS — M25511 Pain in right shoulder: Secondary | ICD-10-CM | POA: Diagnosis not present

## 2019-08-04 MED ORDER — TRAMADOL-ACETAMINOPHEN 37.5-325 MG PO TABS: 1.0000 | ORAL_TABLET | ORAL | 5 refills | Status: DC | PRN

## 2019-08-04 MED ORDER — METHOCARBAMOL 500 MG PO TABS: 500.0000 mg | ORAL_TABLET | Freq: Three times a day (TID) | ORAL | 1 refills | Status: DC
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
## 2019-08-04 NOTE — Telephone Encounter (Signed)
Patient called following today's visit, 08/04/19; states the earliest therapy appointment at [facility] is 08/25/19. Okay to keep appointment date for this location, and move her next office visit out to a date after therapy, or consider another therapy location - please advise.

## 2019-08-04 NOTE — Patient Instructions (Addendum)
Out of work until next visit  Use a heating pad 3 times a day for 20 minutes  Take the muscle relaxer the Advil and the pain medicine as needed

## 2019-08-18 ENCOUNTER — Other Ambulatory Visit: Payer: Self-pay

## 2019-08-18 ENCOUNTER — Ambulatory Visit (INDEPENDENT_AMBULATORY_CARE_PROVIDER_SITE_OTHER): Payer: 59 | Admitting: Orthopedic Surgery

## 2019-08-18 ENCOUNTER — Encounter: Payer: Self-pay | Admitting: Orthopedic Surgery

## 2019-08-18 VITALS — BP 178/105 | HR 103 | Ht 63.0 in | Wt 141.0 lb

## 2019-08-18 DIAGNOSIS — G8929 Other chronic pain: Secondary | ICD-10-CM | POA: Diagnosis not present

## 2019-08-18 DIAGNOSIS — M25511 Pain in right shoulder: Secondary | ICD-10-CM | POA: Diagnosis not present
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
## 2019-08-18 NOTE — Patient Instructions (Signed)
MRI   WORN NOTE OOW 4 WEEKS

## 2019-08-25 ENCOUNTER — Ambulatory Visit (HOSPITAL_COMMUNITY): Payer: 59 | Attending: Orthopedic Surgery | Admitting: Occupational Therapy

## 2019-08-25 ENCOUNTER — Other Ambulatory Visit: Payer: Self-pay

## 2019-08-25 ENCOUNTER — Encounter (HOSPITAL_COMMUNITY): Payer: Self-pay | Admitting: Occupational Therapy

## 2019-08-25 DIAGNOSIS — M25611 Stiffness of right shoulder, not elsewhere classified: Secondary | ICD-10-CM | POA: Diagnosis present

## 2019-08-25 DIAGNOSIS — M25511 Pain in right shoulder: Secondary | ICD-10-CM | POA: Diagnosis present

## 2019-08-25 DIAGNOSIS — R29898 Other symptoms and signs involving the musculoskeletal system: Secondary | ICD-10-CM | POA: Diagnosis present
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
## 2019-08-25 NOTE — Patient Instructions (Signed)
1) SHOULDER: Flexion On Table   Place hands on towel placed on table, elbows straight. Lean forward with you upper body, pushing towel away from body.  _15__ reps per set, _3__ sets per day  2) Abduction (Passive)   With arm out to side, resting on towel placed on table with palm DOWN, keeping trunk away from table, lean to the side while pushing towel away from body.  Repeat __15__ times. Do __3__ sessions per day.  Copyright  VHI. All rights reserved.     3) Internal Rotation (Assistive)   Seated with elbow bent at right angle and held against side, slide arm on table surface in an inward arc keeping elbow anchored in place. Repeat _15___ times. Do __3__ sessions per day. Activity: Use this motion to brush crumbs off the table.  Copyright  VHI. All rights reserved.    

## 2019-08-28 ENCOUNTER — Encounter (HOSPITAL_COMMUNITY): Payer: Self-pay | Admitting: Occupational Therapy

## 2019-08-28 ENCOUNTER — Ambulatory Visit (HOSPITAL_COMMUNITY): Payer: 59 | Admitting: Occupational Therapy

## 2019-08-28 ENCOUNTER — Other Ambulatory Visit: Payer: Self-pay

## 2019-08-28 DIAGNOSIS — M25611 Stiffness of right shoulder, not elsewhere classified: Secondary | ICD-10-CM

## 2019-08-28 DIAGNOSIS — R29898 Other symptoms and signs involving the musculoskeletal system: Secondary | ICD-10-CM

## 2019-08-28 DIAGNOSIS — M25511 Pain in right shoulder: Secondary | ICD-10-CM | POA: Diagnosis not present
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
## 2019-08-30 ENCOUNTER — Telehealth: Payer: Self-pay | Admitting: Orthopedic Surgery

## 2019-08-30 NOTE — Telephone Encounter (Signed)
Patient's notes indicate that this has been taken care of and that therapy is being done at [facility].

## 2019-08-30 NOTE — Telephone Encounter (Signed)
Per forms brought to office ([employer], through patient's employer, [employer]) called back to patient following [employer] representative's review; relays that this form is a separate requestor; therefore, a second fee and authorization form for [employer] is needed; patient voiced understanding. Also aware she may pick up a copy of her initial form ([employer]) - it is attached to the new form.

## 2019-08-31 ENCOUNTER — Telehealth: Payer: Self-pay | Admitting: Orthopedic Surgery

## 2019-08-31 ENCOUNTER — Ambulatory Visit (HOSPITAL_COMMUNITY): Payer: 59 | Admitting: Occupational Therapy

## 2019-08-31 ENCOUNTER — Encounter (HOSPITAL_COMMUNITY): Payer: Self-pay | Admitting: Occupational Therapy

## 2019-08-31 ENCOUNTER — Other Ambulatory Visit: Payer: Self-pay

## 2019-08-31 ENCOUNTER — Encounter: Payer: Self-pay | Admitting: Orthopedic Surgery

## 2019-08-31 DIAGNOSIS — M25611 Stiffness of right shoulder, not elsewhere classified: Secondary | ICD-10-CM

## 2019-08-31 DIAGNOSIS — M25511 Pain in right shoulder: Secondary | ICD-10-CM | POA: Diagnosis not present

## 2019-08-31 DIAGNOSIS — R29898 Other symptoms and signs involving the musculoskeletal system: Secondary | ICD-10-CM

## 2019-08-31 NOTE — Telephone Encounter (Signed)
Patient came by office - requests updated work note to indicate next appointment date, which has been scheduled 09/21/19, following MRI appointment 09/18/19. Okay for note to indicate out of work through appointment date of 09/21/19?

## 2019-08-31 NOTE — Telephone Encounter (Signed)
Done. Patient aware.

## 2019-08-31 NOTE — Telephone Encounter (Signed)
Yes
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
## 2019-09-01 ENCOUNTER — Encounter (HOSPITAL_COMMUNITY): Payer: 59 | Admitting: Occupational Therapy

## 2019-09-04 ENCOUNTER — Encounter (HOSPITAL_COMMUNITY): Payer: Self-pay | Admitting: Occupational Therapy

## 2019-09-04 ENCOUNTER — Other Ambulatory Visit: Payer: Self-pay

## 2019-09-04 ENCOUNTER — Ambulatory Visit (HOSPITAL_COMMUNITY): Payer: 59 | Admitting: Occupational Therapy

## 2019-09-04 DIAGNOSIS — M25511 Pain in right shoulder: Secondary | ICD-10-CM | POA: Diagnosis not present

## 2019-09-04 DIAGNOSIS — R29898 Other symptoms and signs involving the musculoskeletal system: Secondary | ICD-10-CM

## 2019-09-04 DIAGNOSIS — M25611 Stiffness of right shoulder, not elsewhere classified: Secondary | ICD-10-CM
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
## 2019-09-06 ENCOUNTER — Encounter (HOSPITAL_COMMUNITY): Payer: Self-pay | Admitting: Occupational Therapy

## 2019-09-06 ENCOUNTER — Other Ambulatory Visit: Payer: Self-pay

## 2019-09-06 ENCOUNTER — Ambulatory Visit (HOSPITAL_COMMUNITY): Payer: 59 | Admitting: Occupational Therapy

## 2019-09-06 DIAGNOSIS — M25511 Pain in right shoulder: Secondary | ICD-10-CM | POA: Diagnosis not present

## 2019-09-06 DIAGNOSIS — R29898 Other symptoms and signs involving the musculoskeletal system: Secondary | ICD-10-CM

## 2019-09-06 DIAGNOSIS — M25611 Stiffness of right shoulder, not elsewhere classified: Secondary | ICD-10-CM

## 2019-09-06 NOTE — Therapy (Signed)
[place], [place], [address] Phone: [phone]   Fax:  [phone]  Occupational Therapy Treatment  Patient Details  Name: [name] MRN: [id number] Date of Birth: 1980/12/13 Referring Provider (OT): Dr. Arther Abbott   Encounter Date: 09/06/2019   OT End of Session - 09/06/19 1201    Visit Number 5    Number of Visits 16    Date for OT Re-Evaluation 10/24/19    Authorization Type UHC; $60 copay    Authorization Time Period 21 visit limit    Authorization - Visit Number 5    Authorization - Number of Visits 77    OT Start Time 1119    OT Stop Time 1158    OT Time Calculation (min) 39 min    Activity Tolerance Patient tolerated treatment well    Behavior During Therapy [facility] for tasks assessed/performed           Past Medical History:  Diagnosis Date   Frequency of urination    History of kidney stones    Hypertension    followed by pcp   Nephrolithiasis    CT 12-17-2018  left side nonobstructive stones, right ureter stone with obstruction   Right ureteral stone    SOB (shortness of breath)    Urgency of urination     Past Surgical History:  Procedure Laterality Date   APPENDECTOMY  age 24   BALLOON DILATION Left 01/17/2013   Procedure: BALLOON DILATION OF LEFT URETERAL STRICTURE;  Surgeon: Marissa Nestle, MD;  Location: [facility];  Service: Urology;  Laterality: Left;   CYSTOSCOPY W/ URETERAL STENT PLACEMENT Left 01/17/2013   Procedure: CYSTOSCOPY WITH LEFT RETROGRADE PYELOGRAM; LEFT URETERAL STENT PLACEMENT;  Surgeon: Marissa Nestle, MD;  Location: [facility];  Service: Urology;  Laterality: Left;   CYSTOSCOPY WITH RETROGRADE PYELOGRAM, URETEROSCOPY AND STENT PLACEMENT Bilateral 12/23/2018   Procedure: CYSTOSCOPY WITH RETROGRADE PYELOGRAM, URETEROSCOPY AND STENT PLACEMENT;  Surgeon: Alexis Frock, MD;  Location: [facility];  Service: Urology;  Laterality:  Bilateral;  53 MINS   CYSTOSCOPY WITH RETROGRADE PYELOGRAM, URETEROSCOPY AND STENT PLACEMENT Bilateral 01/06/2019   Procedure: CYSTOSCOPY WITH RETROGRADE PYELOGRAM, URETEROSCOPY AND STENT PLACEMENT;  Surgeon: Alexis Frock, MD;  Location: [facility];  Service: Urology;  Laterality: Bilateral;  90 MINS   CYSTOSCOPY/URETEROSCOPY/HOLMIUM LASER/STENT PLACEMENT  2008;  2010;  2011;  2012   FLEXIBLE URETEROSCOPY Left 01/17/2013   Procedure: FLEXIBLE LEFT URETEROSCOPY; ATTEMPTED STONE BASKET EXTRACTION;  Surgeon: Marissa Nestle, MD;  Location: [facility];  Service: Urology;  Laterality: Left;   HOLMIUM LASER APPLICATION Bilateral 16/07/628   Procedure: HOLMIUM LASER APPLICATION;  Surgeon: Alexis Frock, MD;  Location: [facility];  Service: Urology;  Laterality: Bilateral;   HOLMIUM LASER APPLICATION Bilateral 16/02/930   Procedure: HOLMIUM LASER APPLICATION;  Surgeon: Alexis Frock, MD;  Location[facility]  Service: Urology;  Laterality: Bilateral;    There were no vitals filed for this visit.   Subjective Assessment - 09/06/19 1118    Subjective  S: Something goes from my shoulder to my neck sometimes.    Currently in Pain? Yes    Pain Score 3     Pain Location Shoulder    Pain Orientation Right    Pain Descriptors / Indicators Aching;Sore    Pain Type Acute pain    Pain Radiating Towards none    Pain Onset More than a month ago  Pain Frequency Constant    Aggravating Factors  movement    Pain Relieving Factors pain medication    Effect of Pain on Daily Activities max effect on ADLs    Multiple Pain Sites No              [facility] OT Assessment - 09/06/19 1117      Assessment   Medical Diagnosis Right shoulder pain      Precautions   Precautions None                    OT Treatments/Exercises (OP) - 09/06/19 1122      Exercises   Exercises Shoulder      Shoulder Exercises: Supine   Protraction  PROM;AAROM;5 reps    Horizontal ABduction PROM;5 reps    External Rotation PROM;5 reps;AROM;10 reps    Internal Rotation PROM;5 reps;AROM;10 reps    Flexion PROM;AAROM;5 reps    ABduction PROM;5 reps      Shoulder Exercises: Seated   Elevation AROM;10 reps    Extension AROM;10 reps    Row AROM;10 reps    Other Seated Exercises scapular depression, A/ROM, 10X      Shoulder Exercises: Therapy Ball   Flexion 15 reps    ABduction 15 reps      Shoulder Exercises: ROM/Strengthening   Thumb Tacks 1' low level    Prot/Ret//Elev/Dep 1' low leve      Manual Therapy   Manual Therapy Myofascial release    Manual therapy comments Manual therapy completed separately from therapeutic exercises     Myofascial Release myofascial release and manual techniques completed to R upper arm, trapezius, and scapular regions to decrease pain and fascial restrictions and increase joint ROM                     OT Short Term Goals - 08/28/19 1618      OT SHORT TERM GOAL #1   Title Pt will be provided with and educated on HEP to improve mobility required for ADL completion.    Time 4    Period Weeks    Status On-going    Target Date 09/24/19      OT SHORT TERM GOAL #2   Title Pt will improve P/ROM of RUE to [facility] to improve ability to perform dressing tasks with minimal compensatory strategies.    Time 4    Period Weeks    Status On-going      OT SHORT TERM GOAL #3   Title Pt will improve strength in RUE to 3+/5 to improve ability to reach items at shoulder height.    Time 4    Period Weeks    Status On-going             OT Long Term Goals - 08/28/19 1619      OT LONG TERM GOAL #1   Title Pt will decrease pain in RUE to 3/10 or less to improve ability to perform bathing tasks using RUE to reach and scrub.    Time 8    Period Weeks    Status On-going      OT LONG TERM GOAL #2   Title Pt will increase RUE A/ROM to [facility] to improve ability to perform functional reaching overhead  and behind back.    Time 8    Period Weeks    Status On-going      OT LONG TERM GOAL #3   Title Pt will decrease fascial  restrictions in RUE to minimal amounts or less to improve mobility required for overhead reaching.    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    Time 8    Period Weeks    Status On-going                 Plan - 09/06/19 1201    Clinical Impression Statement A: Continued with manual techniques to address pain and fascial restrictions, pt with significant restrictions in anterior shoulder, trapezius, and scapular regions however with decreased restrictions in upper arm from evaluation. Continued with passive stretching, pt tolerating to approximately 50% ROM, er has improved to full range. Added AA/ROM in supine for protraction and flexion, A/ROM for er/IR. Also added thumb tacks and prot/ret/elev/dep, pt reporting weakness with thumb tacks. Verbal cuing for form and technique.    Body Structure / Function / Physical Skills ADL;Endurance;UE functional use;Fascial restriction;Pain;ROM;IADL;Strength    Plan P: Continue with manual techniques and passive stretching, AA/ROM in supine working to increase reps and form    OT Home Exercise Plan eval: table slides    Consulted and Agree with Plan of Care Patient           Patient will benefit from skilled therapeutic intervention in order to improve the following deficits and impairments:   Body Structure / Function / Physical Skills: ADL, Endurance, UE functional use, Fascial restriction, Pain, ROM, IADL, Strength       Visit Diagnosis: Acute pain of right shoulder  Stiffness of right shoulder, not elsewhere classified  Other symptoms and signs involving the musculoskeletal system    Problem List Patient Active Problem List   Diagnosis Date Noted   Atrophy of  left kidney 08/14/2013   Bladder stone 06/13/2011   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   Personal history of noncompliance with medical treatment, presenting hazards to health 06/13/2011   Retained foreign body 06/13/2011   Urinary tract infection 06/13/2011   Urinary tract obstruction due to kidney stone 12/08/2010   Hydronephrosis 11/25/2010   Recurrent nephrolithiasis 89/79/1504   Renal colic on left side 13/64/3837   Pyelonephritis 11/07/2010   Dehydration 11/07/2010   Hypokalemia 11/07/2010   Flank pain 11/04/2010   UTI (urinary tract infection) 11/04/2010   Guadelupe Sabin, OTR/L  [phone] 09/06/2019, 12:04 PM  [place], [place], [address] Phone: [phone]   Fax:  [phone]  Name: [name] MRN: [id number] Date of Birth: October 09, 1980

## 2019-09-08 ENCOUNTER — Encounter (HOSPITAL_COMMUNITY): Payer: 59 | Admitting: Occupational Therapy

## 2019-09-11 ENCOUNTER — Other Ambulatory Visit: Payer: Self-pay

## 2019-09-11 ENCOUNTER — Encounter (HOSPITAL_COMMUNITY): Payer: Self-pay | Admitting: Occupational Therapy

## 2019-09-11 ENCOUNTER — Ambulatory Visit (HOSPITAL_COMMUNITY): Payer: 59 | Admitting: Occupational Therapy

## 2019-09-11 DIAGNOSIS — M25611 Stiffness of right shoulder, not elsewhere classified: Secondary | ICD-10-CM

## 2019-09-11 DIAGNOSIS — M25511 Pain in right shoulder: Secondary | ICD-10-CM

## 2019-09-11 DIAGNOSIS — R29898 Other symptoms and signs involving the musculoskeletal system: Secondary | ICD-10-CM
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
## 2019-09-13 ENCOUNTER — Other Ambulatory Visit: Payer: Self-pay

## 2019-09-13 ENCOUNTER — Encounter (HOSPITAL_COMMUNITY): Payer: Self-pay | Admitting: Occupational Therapy

## 2019-09-13 ENCOUNTER — Ambulatory Visit (HOSPITAL_COMMUNITY): Payer: 59 | Admitting: Occupational Therapy

## 2019-09-13 DIAGNOSIS — M25511 Pain in right shoulder: Secondary | ICD-10-CM | POA: Diagnosis not present

## 2019-09-13 DIAGNOSIS — M25611 Stiffness of right shoulder, not elsewhere classified: Secondary | ICD-10-CM

## 2019-09-13 DIAGNOSIS — R29898 Other symptoms and signs involving the musculoskeletal system: Secondary | ICD-10-CM

## 2019-09-13 NOTE — Therapy (Signed)
[place] [facility] [address] [place], [place], [address] Phone: [phone]   Fax:  [phone]  Occupational Therapy Treatment  Patient Details  Name: HANNAHMARIE ASBERRY MRN: [id number] Date of Birth: [birth year] Referring Provider (OT): Dr. Fuller Canada   Encounter Date: 09/13/2019   OT End of Session - 09/13/19 1152    Visit Number 7    Number of Visits 16    Date for OT Re-Evaluation 10/24/19   mini-reassessment 09/22/19   Authorization Type UHC; $60 copay    Authorization Time Period 60 visit limit    Authorization - Visit Number 7    Authorization - Number of Visits 60    OT Start Time 1119    OT Stop Time 1157    OT Time Calculation (min) 38 min    Activity Tolerance Patient tolerated treatment well    Behavior During Therapy [facility] for tasks assessed/performed           Past Medical History:  Diagnosis Date  . Frequency of urination   . History of kidney stones   . Hypertension    followed by pcp  . Nephrolithiasis    CT 12-17-2018  left side nonobstructive stones, right ureter stone with obstruction  . Right ureteral stone   . SOB (shortness of breath)   . Urgency of urination     Past Surgical History:  Procedure Laterality Date  . APPENDECTOMY  age 75  . BALLOON DILATION Left 01/17/2013   Procedure: BALLOON DILATION OF LEFT URETERAL STRICTURE;  Surgeon: Ky Barban, MD;  Location: [facility];  Service: Urology;  Laterality: Left;  . CYSTOSCOPY W/ URETERAL STENT PLACEMENT Left 01/17/2013   Procedure: CYSTOSCOPY WITH LEFT RETROGRADE PYELOGRAM; LEFT URETERAL STENT PLACEMENT;  Surgeon: Ky Barban, MD;  Location: [facility];  Service: Urology;  Laterality: Left;  . CYSTOSCOPY WITH RETROGRADE PYELOGRAM, URETEROSCOPY AND STENT PLACEMENT Bilateral 12/23/2018   Procedure: CYSTOSCOPY WITH RETROGRADE PYELOGRAM, URETEROSCOPY AND STENT PLACEMENT;  Surgeon: Sebastian Ache, MD;  Location: [facility];  Service:  Urology;  Laterality: Bilateral;  90 MINS  . CYSTOSCOPY WITH RETROGRADE PYELOGRAM, URETEROSCOPY AND STENT PLACEMENT Bilateral 01/06/2019   Procedure: CYSTOSCOPY WITH RETROGRADE PYELOGRAM, URETEROSCOPY AND STENT PLACEMENT;  Surgeon: Sebastian Ache, MD;  Location: [facility];  Service: Urology;  Laterality: Bilateral;  90 MINS  . CYSTOSCOPY/URETEROSCOPY/HOLMIUM LASER/STENT PLACEMENT  2008;  2010;  2011;  2012  . FLEXIBLE URETEROSCOPY Left 01/17/2013   Procedure: FLEXIBLE LEFT URETEROSCOPY; ATTEMPTED STONE BASKET EXTRACTION;  Surgeon: Ky Barban, MD;  Location: [facility];  Service: Urology;  Laterality: Left;  . HOLMIUM LASER APPLICATION Bilateral 12/23/2018   Procedure: HOLMIUM LASER APPLICATION;  Surgeon: Sebastian Ache, MD;  Location: [facility];  Service: Urology;  Laterality: Bilateral;  . HOLMIUM LASER APPLICATION Bilateral 01/06/2019   Procedure: HOLMIUM LASER APPLICATION;  Surgeon: Sebastian Ache, MD;  [facility]  Service: Urology;  Laterality: Bilateral;    There were no vitals filed for this visit.   Subjective Assessment - 09/13/19 1121    Subjective  S: It woke me up this morning like throbbing.    Currently in Pain? Yes    Pain Score 4     Pain Location Shoulder    Pain Orientation Right    Pain Descriptors / Indicators Aching;Sore    Pain Type Acute pain    Pain Radiating Towards None    Pain Onset More than a month ago  Pain Frequency Constant    Aggravating Factors  movement    Pain Relieving Factors pain medication    Effect of Pain on Daily Activities max effect on ADLs    Multiple Pain Sites No              [facility] OT Assessment - 09/13/19 1121      Assessment   Medical Diagnosis Right shoulder pain      Precautions   Precautions None                    OT Treatments/Exercises (OP) - 09/13/19 1122      Exercises   Exercises Shoulder      Shoulder Exercises: Supine   Protraction  PROM;AAROM;5 reps    Horizontal ABduction PROM;5 reps    External Rotation PROM;5 reps;AROM;10 reps    Internal Rotation PROM;5 reps;AROM;10 reps    Flexion PROM;5 reps;AAROM;10 reps    ABduction PROM;5 reps      Shoulder Exercises: Seated   Extension AROM;10 reps    Row AROM;10 reps    Protraction AAROM;5 reps    External Rotation AROM;10 reps    Internal Rotation AROM;10 reps    Flexion AAROM;5 reps    Abduction AAROM;5 reps      Shoulder Exercises: Therapy Ball   Flexion 10 reps    ABduction 10 reps      Shoulder Exercises: ROM/Strengthening   Other ROM/Strengthening Exercises PVC pipe slide, 5X, flexion      Manual Therapy   Manual Therapy Myofascial release    Manual therapy comments Manual therapy completed separately from therapeutic exercises     Myofascial Release myofascial release and manual techniques completed to R upper arm, trapezius, and scapular regions to decrease pain and fascial restrictions and increase joint ROM                     OT Short Term Goals - 08/28/19 1618      OT SHORT TERM GOAL #1   Title Pt will be provided with and educated on HEP to improve mobility required for ADL completion.    Time 4    Period Weeks    Status On-going    Target Date 09/24/19      OT SHORT TERM GOAL #2   Title Pt will improve P/ROM of RUE to [facility] to improve ability to perform dressing tasks with minimal compensatory strategies.    Time 4    Period Weeks    Status On-going      OT SHORT TERM GOAL #3   Title Pt will improve strength in RUE to 3+/5 to improve ability to reach items at shoulder height.    Time 4    Period Weeks    Status On-going             OT Long Term Goals - 08/28/19 1619      OT LONG TERM GOAL #1   Title Pt will decrease pain in RUE to 3/10 or less to improve ability to perform bathing tasks using RUE to reach and scrub.    Time 8    Period Weeks    Status On-going      OT LONG TERM GOAL #2   Title Pt will increase RUE  A/ROM to [facility] to improve ability to perform functional reaching overhead and behind back.    Time 8    Period Weeks    Status On-going  OT LONG TERM GOAL #3   Title Pt will decrease fascial restrictions in RUE to minimal amounts or less to improve mobility required for overhead reaching.    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    Time 8    Period Weeks    Status On-going                 Plan - 09/13/19 1138    Clinical Impression Statement A: Pt reporting throbbing pain at [facility] joint today, experiencing catching in bicep and radiating to [facility] joint as well during certain exercises-protraction. Continued with manual therapy to address fascial restrictions, passive stretching with joint distraction to limit catching. Pt completing AA/ROM with discomfort at 50% ROM. Added AA/ROM protraction, flexion, and abduction in sitting, continues to be painful but improved compared to supine position. Verbal cuing for form and technique.    Body Structure / Function / Physical Skills ADL;Endurance;UE functional use;Fascial restriction;Pain;ROM;IADL;Strength    Plan P: Complete AA/ROM in sitting versus supine, update HEP    OT Home Exercise Plan eval: table slides    Consulted and Agree with Plan of Care Patient           Patient will benefit from skilled therapeutic intervention in order to improve the following deficits and impairments:   Body Structure / Function / Physical Skills: ADL, Endurance, UE functional use, Fascial restriction, Pain, ROM, IADL, Strength       Visit Diagnosis: Acute pain of right shoulder  Stiffness of right shoulder, not elsewhere classified  Other symptoms and signs involving the musculoskeletal system    Problem List Patient Active Problem List   Diagnosis Date Noted  . Atrophy of left kidney 08/14/2013   . Bladder stone 06/13/2011  . 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 Personal history of noncompliance with medical treatment, presenting hazards to health 06/13/2011  . Retained foreign body 06/13/2011  . Urinary tract infection 06/13/2011  . Urinary tract obstruction due to kidney stone 12/08/2010  . Hydronephrosis 11/25/2010  . Recurrent nephrolithiasis 11/25/2010  . Renal colic on left side 11/07/2010  . Pyelonephritis 11/07/2010  . Dehydration 11/07/2010  . Hypokalemia 11/07/2010  . Flank pain 11/04/2010  . UTI (urinary tract infection) 11/04/2010   Ezra Sites, OTR/L  [phone] 09/13/2019, 11:57 AM  [place] [facility] [address] [place], [place], [address] Phone: [phone]   Fax:  [phone]  Name: KIERRA JEZEWSKI MRN: [id number] Date of Birth: 1980-12-17

## 2019-09-15 ENCOUNTER — Encounter (HOSPITAL_COMMUNITY): Payer: 59 | Admitting: Occupational Therapy

## 2019-09-18 ENCOUNTER — Other Ambulatory Visit: Payer: Self-pay

## 2019-09-18 ENCOUNTER — Encounter (HOSPITAL_COMMUNITY): Payer: 59 | Admitting: Occupational Therapy

## 2019-09-18 ENCOUNTER — Ambulatory Visit (HOSPITAL_COMMUNITY)
Admission: RE | Admit: 2019-09-18 | Discharge: 2019-09-18 | Disposition: A | Discharge status: Home / Self Care | Payer: 59 | Source: Ambulatory Visit | Attending: Orthopedic Surgery | Admitting: Orthopedic Surgery

## 2019-09-18 DIAGNOSIS — G8929 Other chronic pain: Secondary | ICD-10-CM | POA: Diagnosis present

## 2019-09-18 DIAGNOSIS — M25511 Pain in right shoulder: Secondary | ICD-10-CM | POA: Diagnosis not present

## 2019-09-20 ENCOUNTER — Ambulatory Visit (HOSPITAL_COMMUNITY): Payer: 59 | Attending: Orthopedic Surgery | Admitting: Occupational Therapy

## 2019-09-20 ENCOUNTER — Encounter (HOSPITAL_COMMUNITY): Payer: Self-pay | Admitting: Occupational Therapy

## 2019-09-20 ENCOUNTER — Other Ambulatory Visit: Payer: Self-pay

## 2019-09-20 DIAGNOSIS — M25611 Stiffness of right shoulder, not elsewhere classified: Secondary | ICD-10-CM | POA: Insufficient documentation

## 2019-09-20 DIAGNOSIS — R29898 Other symptoms and signs involving the musculoskeletal system: Secondary | ICD-10-CM | POA: Diagnosis present

## 2019-09-20 DIAGNOSIS — M25511 Pain in right shoulder: Secondary | ICD-10-CM | POA: Insufficient documentation

## 2019-09-20 NOTE — Therapy (Signed)
[facility] [address] [place], [place], [address] Phone: [phone]   Fax:  [phone]  Occupational Therapy Treatment (Mini-reassessment)  Patient Details  Name: [name] MRN: [id number] Date of Birth: [birth year] Referring Provider (OT): Dr. Fuller Canada     Encounter Date: 09/20/2019   OT End of Session - 09/20/19 1200    Visit Number 8    Number of Visits 16    Date for OT Re-Evaluation 10/24/19    Authorization Type UHC; $60 copay    Authorization Time Period 60 visit limit    Authorization - Visit Number 8    Authorization - Number of Visits 60    OT Start Time 1116    OT Stop Time 1156    OT Time Calculation (min) 40 min    Activity Tolerance Patient tolerated treatment well    Behavior During Therapy [facility] for tasks assessed/performed           [facility] OT Assessment - 09/20/19 1115      Assessment   Medical Diagnosis Right shoulder pain      Precautions   Precautions None      Observation/Other Assessments   Focus on Therapeutic Outcomes (FOTO)  50/100   51/100 previous     Palpation   Palpation comment Pt with mod fascial restrictions in right upper arm, trapezius, and scapular regions      AROM   Overall AROM Comments Assessed seated, er/IR adducted    AROM Assessment Site Shoulder    Right/Left Shoulder Right    Right Shoulder Flexion 115 Degrees   76 previous   Right Shoulder ABduction 102 Degrees   70 previous   Right Shoulder Internal Rotation 90 Degrees   same as previous   Right Shoulder External Rotation 60 Degrees   50 previous     PROM   Overall PROM Comments Assessed supine, er/IR adducted    PROM Assessment Site Shoulder    Right/Left Shoulder Right    Right Shoulder Flexion 130 Degrees   95 previous   Right Shoulder ABduction 148 Degrees   78 previous   Right Shoulder Internal Rotation 90 Degrees   same as previous   Right Shoulder External Rotation 71 Degrees   62 previous      Strength   Overall Strength Comments Assessed seated, er/IR adducted    Strength Assessment Site Shoulder    Right/Left Shoulder Right    Right Shoulder Flexion 3-/5   same as previous   Right Shoulder ABduction 3-/5   same as previous   Right Shoulder Internal Rotation 3/5   same as previous   Right Shoulder External Rotation 3/5   same as previous           Past Medical History:  Diagnosis Date  . Frequency of urination   . History of kidney stones   . Hypertension    followed by pcp  . Nephrolithiasis    CT 12-17-2018  left side nonobstructive stones, right ureter stone with obstruction  . Right ureteral stone   . SOB (shortness of breath)   . Urgency of urination     Past Surgical History:  Procedure Laterality Date  . APPENDECTOMY  age 39  . BALLOON DILATION Left 01/17/2013   Procedure: BALLOON DILATION OF LEFT URETERAL STRICTURE;  Surgeon: Ky Barban, MD;  Location: [facility];  Service: Urology;  Laterality: Left;  . CYSTOSCOPY W/ URETERAL STENT PLACEMENT Left 01/17/2013   Procedure:  CYSTOSCOPY WITH LEFT RETROGRADE PYELOGRAM; LEFT URETERAL STENT PLACEMENT;  Surgeon: Ky Barban, MD;  Location: [facility];  Service: Urology;  Laterality: Left;  . CYSTOSCOPY WITH RETROGRADE PYELOGRAM, URETEROSCOPY AND STENT PLACEMENT Bilateral 12/23/2018   Procedure: CYSTOSCOPY WITH RETROGRADE PYELOGRAM, URETEROSCOPY AND STENT PLACEMENT;  Surgeon: Sebastian Ache, MD;  Location: [facility];  Service: Urology;  Laterality: Bilateral;  90 MINS  . CYSTOSCOPY WITH RETROGRADE PYELOGRAM, URETEROSCOPY AND STENT PLACEMENT Bilateral 01/06/2019   Procedure: CYSTOSCOPY WITH RETROGRADE PYELOGRAM, URETEROSCOPY AND STENT PLACEMENT;  Surgeon: Sebastian Ache, MD;  Location: [facility];  Service: Urology;  Laterality: Bilateral;  90 MINS  . CYSTOSCOPY/URETEROSCOPY/HOLMIUM LASER/STENT PLACEMENT  2008;  2010;  2011;  2012  . FLEXIBLE URETEROSCOPY Left 01/17/2013    Procedure: FLEXIBLE LEFT URETEROSCOPY; ATTEMPTED STONE BASKET EXTRACTION;  Surgeon: Ky Barban, MD;  Location: [facility];  Service: Urology;  Laterality: Left;  . HOLMIUM LASER APPLICATION Bilateral 12/23/2018   Procedure: HOLMIUM LASER APPLICATION;  Surgeon: Sebastian Ache, MD;  Location: [facility];  Service: Urology;  Laterality: Bilateral;  . HOLMIUM LASER APPLICATION Bilateral 01/06/2019   Procedure: HOLMIUM LASER APPLICATION;  Surgeon: Sebastian Ache, MD;  Location: [facility];  Service: Urology;  Laterality: Bilateral;    There were no vitals filed for this visit.   Subjective Assessment - 09/20/19 1116    Subjective  S: My results are in mychart.    Currently in Pain? Yes    Pain Score 4     Pain Location Shoulder    Pain Orientation Right    Pain Descriptors / Indicators Aching;Sore    Pain Type Acute pain    Pain Radiating Towards None    Pain Onset More than a month ago    Pain Frequency Constant    Aggravating Factors  movement    Pain Relieving Factors pain medication    Effect of Pain on Daily Activities max effect on ADLs              [facility] OT Assessment - 09/20/19 1115      Assessment   Medical Diagnosis Right shoulder pain      Precautions   Precautions None      Observation/Other Assessments   Focus on Therapeutic Outcomes (FOTO)  50/100   51/100 previous     Palpation   Palpation comment Pt with mod fascial restrictions in right upper arm, trapezius, and scapular regions      AROM   Overall AROM Comments Assessed seated, er/IR adducted    AROM Assessment Site Shoulder    Right/Left Shoulder Right    Right Shoulder Flexion 115 Degrees   76 previous   Right Shoulder ABduction 102 Degrees   70 previous   Right Shoulder Internal Rotation 90 Degrees   same as previous   Right Shoulder External Rotation 60 Degrees   50 previous     PROM   Overall PROM Comments Assessed supine, er/IR adducted    PROM Assessment  Site Shoulder    Right/Left Shoulder Right    Right Shoulder Flexion 130 Degrees   95 previous   Right Shoulder ABduction 148 Degrees   78 previous   Right Shoulder Internal Rotation 90 Degrees   same as previous   Right Shoulder External Rotation 71 Degrees   62 previous     Strength   Overall Strength Comments Assessed seated, er/IR adducted    Strength Assessment Site Shoulder    Right/Left Shoulder  Right    Right Shoulder Flexion 3-/5   same as previous   Right Shoulder ABduction 3-/5   same as previous   Right Shoulder Internal Rotation 3/5   same as previous   Right Shoulder External Rotation 3/5   same as previous                   OT Treatments/Exercises (OP) - 09/20/19 1123      Exercises   Exercises Shoulder      Shoulder Exercises: Supine   Protraction PROM;AAROM;5 reps    Horizontal ABduction PROM;5 reps    External Rotation PROM;5 reps;AROM;10 reps    Internal Rotation PROM;5 reps;AROM;10 reps    Flexion PROM;5 reps;AAROM;10 reps    ABduction PROM;5 reps      Modalities   Modalities Ultrasound      Ultrasound   Ultrasound Location right shoulder at [facility] joint    Ultrasound Parameters 1.5 W/cm2    Ultrasound Goals Pain      Manual Therapy   Manual Therapy Myofascial release    Manual therapy comments Manual therapy completed separately from therapeutic exercises     Myofascial Release myofascial release and manual techniques completed to R upper arm, trapezius, and scapular regions to decrease pain and fascial restrictions and increase joint ROM                     OT Short Term Goals - 08/28/19 1618      OT SHORT TERM GOAL #1   Title Pt will be provided with and educated on HEP to improve mobility required for ADL completion.    Time 4    Period Weeks    Status On-going    Target Date 09/24/19      OT SHORT TERM GOAL #2   Title Pt will improve P/ROM of RUE to [facility] to improve ability to perform dressing tasks with minimal  compensatory strategies.    Time 4    Period Weeks    Status On-going      OT SHORT TERM GOAL #3   Title Pt will improve strength in RUE to 3+/5 to improve ability to reach items at shoulder height.    Time 4    Period Weeks    Status On-going             OT Long Term Goals - 08/28/19 1619      OT LONG TERM GOAL #1   Title Pt will decrease pain in RUE to 3/10 or less to improve ability to perform bathing tasks using RUE to reach and scrub.    Time 8    Period Weeks    Status On-going      OT LONG TERM GOAL #2   Title Pt will increase RUE A/ROM to [facility] to improve ability to perform functional reaching overhead and behind back.    Time 8    Period Weeks    Status On-going      OT LONG TERM GOAL #3   Title Pt will decrease fascial restrictions in RUE to minimal amounts or less to improve mobility required for overhead reaching.    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    Time 8    Period Weeks  Status On-going                 Plan - 09/20/19 1146    Clinical Impression Statement A: Mini-reassessment completed this session, pt has made progress towards goals however is limited due to significant pain. She continues to have difficulty with functional use due to pain and ROM/strength limitations. Pt reports she had her MRI and the results are in MyChart, pt with questions regarding terminology which OT answered. Completed US this session for pain management, continued with passive stretching and AA/ROM exercises. Pt requiring increased time for completion due to pain. Verbal cuing for form and technique.    Body Structure / Function / Physical Skills ADL;Endurance;UE functional use;Fascial restriction;Pain;ROM;IADL;Strength    Plan P: Follow up on MD appt-pt will decide whether to continue or hold therapy depending up on MD  recommendations    OT Home Exercise Plan eval: table slides    Consulted and Agree with Plan of Care Patient           Patient will benefit from skilled therapeutic intervention in order to improve the following deficits and impairments:   Body Structure / Function / Physical Skills: ADL, Endurance, UE functional use, Fascial restriction, Pain, ROM, IADL, Strength       Visit Diagnosis: Acute pain of right shoulder  Stiffness of right shoulder, not elsewhere classified  Other symptoms and signs involving the musculoskeletal system    Problem List Patient Active Problem List   Diagnosis Date Noted  . Atrophy of left kidney 08/14/2013  . Bladder stone 06/13/2011  . 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 Personal history of noncompliance with medical treatment, presenting hazards to health 06/13/2011  . Retained foreign body 06/13/2011  . Urinary tract infection 06/13/2011  . Urinary tract obstruction due to kidney stone 12/08/2010  . Hydronephrosis 11/25/2010  . Recurrent nephrolithiasis 11/25/2010  . Renal colic on left side 11/07/2010  . Pyelonephritis 11/07/2010  . Dehydration 11/07/2010  . Hypokalemia 11/07/2010  . Flank pain 11/04/2010  . UTI (urinary tract infection) 11/04/2010   Ezra SitesLeslie Lya Holben, OTR/L  [phone] 09/20/2019, 12:01 PM  [place] [facility] [address] [place], [place], [address] Phone: [phone]   Fax:  [phone]  Name: Emily Richardsequila N [name] MRN: [id number] Date of Birth: Aug 09, 1980

## 2019-09-21 ENCOUNTER — Ambulatory Visit (INDEPENDENT_AMBULATORY_CARE_PROVIDER_SITE_OTHER): Payer: 59 | Admitting: Orthopedic Surgery

## 2019-09-21 ENCOUNTER — Encounter: Payer: Self-pay | Admitting: Orthopedic Surgery

## 2019-09-21 VITALS — BP 190/123 | HR 103 | Ht 63.0 in | Wt 148.0 lb

## 2019-09-21 DIAGNOSIS — G8929 Other chronic pain: Secondary | ICD-10-CM

## 2019-09-21 DIAGNOSIS — S46011D Strain of muscle(s) and tendon(s) of the rotator cuff of right shoulder, subsequent encounter: Secondary | ICD-10-CM

## 2019-09-21 NOTE — Patient Instructions (Addendum)
Need new OOW note  Starting from July 31 to + 6 months    Surgery for Rotator Cuff Tear The rotator cuff is a group of muscles and connective tissues (tendons) that surround the shoulder joint and keep the upper arm bone (humerus) in the shoulder socket. A tendon is the place on a muscle where it attaches to a bone. Surgery may be done to repair a partial or complete tear in the rotator cuff that cannot be treated by nonsurgical methods. The exact procedure that you will have depends on your injury. If you have a partial tear, you may have surgery to reattach a tendon to the humerus. If you have a complete tear, you may have surgery to sew the two sides of the tear back together. Surgery may be done through small incisions using an operating telescope (arthroscope), through a larger (open) incision, or through a combination of both. Tell a health care provider about:  Any allergies you have.  All medicines you are taking, including vitamins, herbs, eye drops, creams, and over-the-counter medicines.  Any problems you or family members have had with anesthetic medicines.  Any blood disorders you have.  Any surgeries you have had.  Any medical conditions you have.  Whether you are pregnant or may be pregnant. What are the risks? Generally, this is a safe procedure. However, problems may occur, including:  Infection.  Bleeding.  Allergic reactions to medicines or materials used during the procedure.  Damage to nerves, blood vessels, or shoulder muscles.  Permanent loss of full shoulder movement (stiffness). What happens before the procedure? Medicines Ask your health care provider about:  Changing or stopping your regular medicines. This is especially important if you are taking diabetes medicines or blood thinners.  Taking medicines such as aspirin and ibuprofen. These medicines can thin your blood. Do not take these medicines unless your health care provider tells you to take  them.  Taking over-the-counter medicines, vitamins, herbs, and supplements. Eating and drinking Follow instructions from your health care provider about eating and drinking, which may include:  8 hours before the procedure - stop eating heavy meals or foods, such as meat, fried foods, or fatty foods.  6 hours before the procedure - stop eating light meals or foods, such as toast or cereal.  6 hours before the procedure - stop drinking milk or drinks that contain milk.  2 hours before the procedure - stop drinking clear liquids. Staying hydrated Follow instructions from your health care provider about hydration, which may include:  Up to 2 hours before the procedure - you may continue to drink clear liquids, such as water, clear fruit juice, black coffee, and plain tea. General instructions   Plan to have someone take you home from the hospital or clinic.  Plan to have a responsible adult care for you for at least 24 hours after you leave the hospital or clinic. This is important.  Ask your health care provider what steps will be taken to help prevent infection. These may include: ? Removing hair at the surgery site. ? Washing skin with a germ-killing soap. ? Taking antibiotic medicine.  Do not use any products that contain nicotine or tobacco for at least 4 weeks before the procedure. These products include cigarettes, e-cigarettes, and chewing tobacco. If you need help quitting, ask your health care provider. What happens during the procedure?   An IV will be inserted into one of your veins.  You will be given one or more  of the following: ? A medicine to help you relax (sedative). ? A medicine to make you fall asleep (general anesthetic). ? A medicine that is injected into an area of your body to numb everything beyond the injection site (regional anesthetic).  Your surgeon will move your shoulder to observe your injury.  Your surgeon will make incisions based on the type  of surgery you are having. ? If you are having arthroscopic surgery:  Small incisions will be made in the front and back of your shoulder.  An arthroscope will be inserted through these incisions to examine the inside of your shoulder and plan the surgery. ? If you are having open surgery, a wider incision will be made in your shoulder.  Some of the muscle covering your shoulder (deltoid muscle) may be moved to expose your rotator cuff.  Bony growths that might interfere with healing will be removed.  Your rotator cuff will be trimmed around the area where it has torn away from your humerus.  If your rotator cuff is completely torn, the split ends will be sewn back together.  Anchoring inserts will be placed into your humerus in the area where the tendon has torn away from the bone.  The torn end of your rotator cuff will be reattached (anchored) to your humerus using stitches and small screws.  Your incisions will be closed with stitches (sutures).  The incision in your skin will be covered with a bandage (dressing) and medicine.  Your arm will be placed in a sling or a shoulder immobilizer. A shoulder immobilizer keeps your arm from moving (immobilizes your arm) while the injured shoulder heals. The procedure may vary among health care providers and hospitals. What happens after the procedure?  Your blood pressure, heart rate, breathing rate, and blood oxygen level will be monitored until you leave the hospital or clinic.  You may have some pain. Medicines will be available to help you.  Do not drive for 24 hours if you were given a sedative during your procedure. Summary  The rotator cuff is a group of muscles and connective tissues (tendons) that surround the shoulder joint and keep the upper arm bone (humerus) in the shoulder socket.  Surgery may be done to repair a partial or complete tear in a rotator cuff.  Follow instructions from your health care provider about taking  medicines and about eating and drinking before the procedure.  After surgery, your arm will be in a sling or a shoulder immobilizer. This information is not intended to replace advice given to you by your health care provider. Make sure you discuss any questions you have with your health care provider. Document Revised: 11/08/2017 Document Reviewed: 11/10/2017 Elsevier Patient Education  2020 [employer].

## 2019-09-21 NOTE — Addendum Note (Signed)
Addended byCaffie Damme on: 09/21/2019 02:53 PM   Modules accepted: Orders, SmartSet
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
## 2019-09-22 ENCOUNTER — Telehealth (HOSPITAL_COMMUNITY): Payer: Self-pay | Admitting: Occupational Therapy

## 2019-09-22 ENCOUNTER — Encounter (HOSPITAL_COMMUNITY): Payer: 59 | Admitting: Occupational Therapy

## 2019-09-22 NOTE — Telephone Encounter (Signed)
pt called to cx all of her appts per dr Romeo Apple untill she has her surgery. She might need to be discharged.

## 2019-09-26 ENCOUNTER — Ambulatory Visit (HOSPITAL_COMMUNITY): Payer: 59 | Admitting: Occupational Therapy

## 2019-09-27 ENCOUNTER — Encounter (HOSPITAL_COMMUNITY): Payer: Self-pay | Admitting: Occupational Therapy

## 2019-09-27 NOTE — Therapy (Signed)
[facility] [place], [place], [address] Phone: [phone]   Fax:  [phone]  Patient Details  Name: [name] MRN: [id number] Date of Birth: Sep 13, 1980 Referring Provider:  No ref. provider found  Encounter Date: 09/27/2019   OCCUPATIONAL THERAPY DISCHARGE SUMMARY  Visits from Start of Care: 8  Current functional level related to goals / functional outcomes: Pt continues to have pain limiting functional shoulder use. Pt had MRI which shows RC tear, followed up with MD who has ordered surgery. Pt will be discharged and new order required after surgery.    Remaining deficits: Pain, decreased ROM, strength, and functional use of RUE   Education / Equipment: HEP for gentle stretching Plan: Patient agrees to discharge.  Patient goals were not met. Patient is being discharged due to the patient's request.  ?????      Guadelupe Sabin, OTR/L  [phone] 09/27/2019, 10:48 AM  [place], [place], [address] Phone: [phone]   Fax:  [phone]

## 2019-09-28 ENCOUNTER — Telehealth: Payer: Self-pay | Admitting: Radiology

## 2019-09-28 ENCOUNTER — Encounter (HOSPITAL_COMMUNITY): Payer: 59 | Admitting: Occupational Therapy

## 2019-09-28 NOTE — Telephone Encounter (Signed)
Patient has brought in card.
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
## 2019-09-29 ENCOUNTER — Encounter (HOSPITAL_COMMUNITY): Payer: 59 | Admitting: Occupational Therapy

## 2019-10-03 ENCOUNTER — Telehealth: Payer: Self-pay | Admitting: Radiology

## 2019-10-03 ENCOUNTER — Encounter (HOSPITAL_COMMUNITY): Payer: 59 | Admitting: Occupational Therapy

## 2019-10-03 NOTE — Telephone Encounter (Signed)
Called [facility] spoke to Dane no authorization is required for [id number] Arthroscopic  RCR / ref number is his name todays date

## 2019-10-05 ENCOUNTER — Encounter (HOSPITAL_COMMUNITY): Payer: 59 | Admitting: Occupational Therapy

## 2019-10-06 ENCOUNTER — Encounter (HOSPITAL_COMMUNITY): Payer: 59 | Admitting: Occupational Therapy

## 2019-10-06 NOTE — Patient Instructions (Signed)
[name]  10/06/2019     @PREFPERIOPPHARMACY @   Your procedure is scheduled on  10/12/2019   Report to [facility] at  0615  A.M.  Call this number if you have problems the morning of surgery:  [phone]   Remember:  Do not eat or drink after midnight.                         Take these medicines the morning of surgery with A SIP OF WATER  Tramadol(if needed).    Do not wear jewelry, make-up or nail polish.  Do not wear lotions, powders, or perfumes, or deodorant. Please brush your teeth.  Do not shave 48 hours prior to surgery.  Men may shave face and neck.  Do not bring valuables to the hospital.  [facility] is not responsible for any belongings or valuables.  Contacts, dentures or bridgework may not be worn into surgery.  Leave your suitcase in the car.  After surgery it may be brought to your room.  For patients admitted to the hospital, discharge time will be determined by your treatment team.  Patients discharged the day of surgery will not be allowed to drive home.   Name and phone number of your driver:   family Special instructions:  DO NOT smoke the morning of your procedure.  Please read over the following fact sheets that you were given. Anesthesia Post-op Instructions and Care and Recovery After Surgery       Surgery for Rotator Cuff Tear, Care After This sheet gives you information about how to care for yourself after your procedure. Your health care provider may also give you more specific instructions. If you have problems or questions, contact your health care provider. What can I expect after the procedure? After the procedure, it is common to have:  Swelling.  Pain.  Stiffness.  Tenderness. Follow these instructions at home: If you have a sling or a shoulder immobilizer:  Wear it as told by your health care provider. Remove it only as told by your health care provider.  Loosen it if your fingers tingle, become numb, or  turn cold and blue.  Keep it clean. Bathing  Do not take baths, swim, or use a hot tub until your health care provider approves. Ask your health care provider if you may take showers. You may only be allowed to take sponge baths.  Keep your bandage (dressing) dry until your health care provider says it can be removed.  If your sling or shoulder immobilizer is not waterproof: ? Do not let it get wet. ? Remove it when you take a bath or shower as told by your health care provider. Once the sling or shoulder immobilizer is removed, try not to move your shoulder until your health care provider says that you can. Incision care   Follow instructions from your health care provider about how to take care of your incision. Make sure you: ? Wash your hands with soap and water before and after you change your dressing. If soap and water are not available, use hand sanitizer. ? Change your dressing as told by your health care provider. ? Leave stitches (sutures), skin glue, or adhesive strips in place. These skin closures may need to stay in place for 2 weeks or longer. If adhesive strip edges start to loosen and curl up, you may trim the loose edges. Do not  remove adhesive strips completely unless your health care provider tells you to do that.  Check your incision area every day for signs of infection. Check for: ? More redness, swelling, or pain. ? More fluid or blood. ? Warmth. ? Pus or a bad smell. Managing pain, stiffness, and swelling   If directed, put ice on your shoulder area. ? Put ice in a plastic bag. ? Place a towel between your skin and the bag. ? Leave the ice on for 20 minutes, 2-3 times a day.  Move your fingers often to reduce stiffness and swelling.  Raise (elevate) your upper body on pillows when you lie down and when you sleep. ? Do not sleep on the front of your body (abdomen). ? Do not sleep on the side that your surgery was performed on. Medicines  Take  over-the-counter and prescription medicines only as told by your health care provider.  Ask your health care provider if the medicine prescribed to you: ? Requires you to avoid driving or using heavy machinery. ? Can cause constipation. You may need to take actions to prevent or treat constipation, such as:  Drink enough fluid to keep your urine pale yellow.  Take over-the-counter or prescription medicines.  Eat foods that are high in fiber, such as beans, whole grains, and fresh fruits and vegetables.  Limit foods that are high in fat and processed sugars, such as fried or sweet foods. Driving  Do not drive for 24 hours if you were given a sedative during your procedure.  Do not drive while wearing a sling or a shoulder immobilizer. Ask your health care provider when it is safe to drive. Activity  Do not use your arm to support your body weight until your health care provider says that you can.  Do not lift or hold anything with your arm until your health care provider approves.  Return to your normal activities as told by your health care provider. Ask your health care provider what activities are safe for you.  Do exercises as told by your health care provider. General instructions  Do not use any products that contain nicotine or tobacco, such as cigarettes, e-cigarettes, and chewing tobacco. These can delay healing after surgery. If you need help quitting, ask your health care provider.  Keep all follow-up visits as told by your health care provider. This is important. Contact a health care provider if:  You have a fever.  You have more redness, swelling, or pain around your incision.  You have more fluid or blood coming from your incision.  Your incision feels warm to the touch.  You have pus or a bad smell coming from your incision.  You have pain that gets worse or does not get better with medicine. Get help right away if:  You have severe pain.  You lose  feeling in your arm or hand.  Your hand or fingers turn very pale or blue. Summary  If you have a sling, wear it as told by your health care provider. Remove it only as told by your health care provider.  Change your dressing as told by your health care provider. Check the incision area every day for signs of infection.  If directed, put ice on your shoulder area 2-3 times a day.  Do not use your arm to lift anything or to support your body weight until your health care provider says that you can. This information is not intended to replace advice given to you  by your health care provider. Make sure you discuss any questions you have with your health care provider. Document Revised: 11/08/2017 Document Reviewed: 11/10/2017 Elsevier Patient Education  2020 [employer]  General Anesthesia, Adult, Care After This sheet gives you information about how to care for yourself after your procedure. Your health care provider may also give you more specific instructions. If you have problems or questions, contact your health care provider. What can I expect after the procedure? After the procedure, the following side effects are common:  Pain or discomfort at the IV site.  Nausea.  Vomiting.  Sore throat.  Trouble concentrating.  Feeling cold or chills.  Weak or tired.  Sleepiness and fatigue.  Soreness and body aches. These side effects can affect parts of the body that were not involved in surgery. Follow these instructions at home:  For at least 24 hours after the procedure:  Have a responsible adult stay with you. It is important to have someone help care for you until you are awake and alert.  Rest as needed.  Do not: ? Participate in activities in which you could fall or become injured. ? Drive. ? Use heavy machinery. ? Drink alcohol. ? Take sleeping pills or medicines that cause drowsiness. ? Make important decisions or sign legal documents. ? Take care of children  on your own. Eating and drinking  Follow any instructions from your health care provider about eating or drinking restrictions.  When you feel hungry, start by eating small amounts of foods that are soft and easy to digest (bland), such as toast. Gradually return to your regular diet.  Drink enough fluid to keep your urine pale yellow.  If you vomit, rehydrate by drinking water, juice, or clear broth. General instructions  If you have sleep apnea, surgery and certain medicines can increase your risk for breathing problems. Follow instructions from your health care provider about wearing your sleep device: ? Anytime you are sleeping, including during daytime naps. ? While taking prescription pain medicines, sleeping medicines, or medicines that make you drowsy.  Return to your normal activities as told by your health care provider. Ask your health care provider what activities are safe for you.  Take over-the-counter and prescription medicines only as told by your health care provider.  If you smoke, do not smoke without supervision.  Keep all follow-up visits as told by your health care provider. This is important. Contact a health care provider if:  You have nausea or vomiting that does not get better with medicine.  You cannot eat or drink without vomiting.  You have pain that does not get better with medicine.  You are unable to pass urine.  You develop a skin rash.  You have a fever.  You have redness around your IV site that gets worse. Get help right away if:  You have difficulty breathing.  You have chest pain.  You have blood in your urine or stool, or you vomit blood. Summary  After the procedure, it is common to have a sore throat or nausea. It is also common to feel tired.  Have a responsible adult stay with you for the first 24 hours after general anesthesia. It is important to have someone help care for you until you are awake and alert.  When you feel  hungry, start by eating small amounts of foods that are soft and easy to digest (bland), such as toast. Gradually return to your regular diet.  Drink enough fluid to  keep your urine pale yellow.  Return to your normal activities as told by your health care provider. Ask your health care provider what activities are safe for you. This information is not intended to replace advice given to you by your health care provider. Make sure you discuss any questions you have with your health care provider. Document Revised: 02/05/2017 Document Reviewed: 09/18/2016 Elsevier Patient Education  2020 [employer]. How to Use Chlorhexidine for Bathing Chlorhexidine gluconate (CHG) is a germ-killing (antiseptic) solution that is used to clean the skin. It can get rid of the bacteria that normally live on the skin and can keep them away for about 24 hours. To clean your skin with CHG, you may be given:  A CHG solution to use in the shower or as part of a sponge bath.  A prepackaged cloth that contains CHG. Cleaning your skin with CHG may help lower the risk for infection:  While you are staying in the intensive care unit of the hospital.  If you have a vascular access, such as a central line, to provide short-term or long-term access to your veins.  If you have a catheter to drain urine from your bladder.  If you are on a ventilator. A ventilator is a machine that helps you breathe by moving air in and out of your lungs.  After surgery. What are the risks? Risks of using CHG include:  A skin reaction.  Hearing loss, if CHG gets in your ears.  Eye injury, if CHG gets in your eyes and is not rinsed out.  The CHG product catching fire. Make sure that you avoid smoking and flames after applying CHG to your skin. Do not use CHG:  If you have a chlorhexidine allergy or have previously reacted to chlorhexidine.  On babies younger than 5 months of age. How to use CHG solution  Use CHG only as told  by your health care provider, and follow the instructions on the label.  Use the full amount of CHG as directed. Usually, this is one bottle. During a shower Follow these steps when using CHG solution during a shower (unless your health care provider gives you different instructions): 1. Start the shower. 2. Use your normal soap and shampoo to wash your face and hair. 3. Turn off the shower or move out of the shower stream. 4. Pour the CHG onto a clean washcloth. Do not use any type of brush or rough-edged sponge. 5. Starting at your neck, lather your body down to your toes. Make sure you follow these instructions: ? If you will be having surgery, pay special attention to the part of your body where you will be having surgery. Scrub this area for at least 1 minute. ? Do not use CHG on your head or face. If the solution gets into your ears or eyes, rinse them well with water. ? Avoid your genital area. ? Avoid any areas of skin that have broken skin, cuts, or scrapes. ? Scrub your back and under your arms. Make sure to wash skin folds. 6. Let the lather sit on your skin for 1-2 minutes or as long as told by your health care provider. 7. Thoroughly rinse your entire body in the shower. Make sure that all body creases and crevices are rinsed well. 8. Dry off with a clean towel. Do not put any substances on your body afterward--such as powder, lotion, or perfume--unless you are told to do so by your health care provider. Only  use lotions that are recommended by the manufacturer. 9. Put on clean clothes or pajamas. 10. If it is the night before your surgery, sleep in clean sheets.  During a sponge bath Follow these steps when using CHG solution during a sponge bath (unless your health care provider gives you different instructions): 1. Use your normal soap and shampoo to wash your face and hair. 2. Pour the CHG onto a clean washcloth. 3. Starting at your neck, lather your body down to your toes.  Make sure you follow these instructions: ? If you will be having surgery, pay special attention to the part of your body where you will be having surgery. Scrub this area for at least 1 minute. ? Do not use CHG on your head or face. If the solution gets into your ears or eyes, rinse them well with water. ? Avoid your genital area. ? Avoid any areas of skin that have broken skin, cuts, or scrapes. ? Scrub your back and under your arms. Make sure to wash skin folds. 4. Let the lather sit on your skin for 1-2 minutes or as long as told by your health care provider. 5. Using a different clean, wet washcloth, thoroughly rinse your entire body. Make sure that all body creases and crevices are rinsed well. 6. Dry off with a clean towel. Do not put any substances on your body afterward--such as powder, lotion, or perfume--unless you are told to do so by your health care provider. Only use lotions that are recommended by the manufacturer. 7. Put on clean clothes or pajamas. 8. If it is the night before your surgery, sleep in clean sheets. How to use CHG prepackaged cloths  Only use CHG cloths as told by your health care provider, and follow the instructions on the label.  Use the CHG cloth on clean, dry skin.  Do not use the CHG cloth on your head or face unless your health care provider tells you to.  When washing with the CHG cloth: ? Avoid your genital area. ? Avoid any areas of skin that have broken skin, cuts, or scrapes. Before surgery Follow these steps when using a CHG cloth to clean before surgery (unless your health care provider gives you different instructions): 1. Using the CHG cloth, vigorously scrub the part of your body where you will be having surgery. Scrub using a back-and-forth motion for 3 minutes. The area on your body should be completely wet with CHG when you are done scrubbing. 2. Do not rinse. Discard the cloth and let the area air-dry. Do not put any substances on the area  afterward, such as powder, lotion, or perfume. 3. Put on clean clothes or pajamas. 4. If it is the night before your surgery, sleep in clean sheets.  For general bathing Follow these steps when using CHG cloths for general bathing (unless your health care provider gives you different instructions). 1. Use a separate CHG cloth for each area of your body. Make sure you wash between any folds of skin and between your fingers and toes. Wash your body in the following order, switching to a new cloth after each step: ? The front of your neck, shoulders, and chest. ? Both of your arms, under your arms, and your hands. ? Your stomach and groin area, avoiding the genitals. ? Your right leg and foot. ? Your left leg and foot. ? The back of your neck, your back, and your buttocks. 2. Do not rinse. Discard the cloth  and let the area air-dry. Do not put any substances on your body afterward--such as powder, lotion, or perfume--unless you are told to do so by your health care provider. Only use lotions that are recommended by the manufacturer. 3. Put on clean clothes or pajamas. Contact a health care provider if:  Your skin gets irritated after scrubbing.  You have questions about using your solution or cloth. Get help right away if:  Your eyes become very red or swollen.  Your eyes itch badly.  Your skin itches badly and is red or swollen.  Your hearing changes.  You have trouble seeing.  You have swelling or tingling in your mouth or throat.  You have trouble breathing.  You swallow any chlorhexidine. Summary  Chlorhexidine gluconate (CHG) is a germ-killing (antiseptic) solution that is used to clean the skin. Cleaning your skin with CHG may help to lower your risk for infection.  You may be given CHG to use for bathing. It may be in a bottle or in a prepackaged cloth to use on your skin. Carefully follow your health care provider's instructions and the instructions on the product  label.  Do not use CHG if you have a chlorhexidine allergy.  Contact your health care provider if your skin gets irritated after scrubbing. This information is not intended to replace advice given to you by your health care provider. Make sure you discuss any questions you have with your health care provider. Document Revised: 04/21/2018 Document Reviewed: 12/31/2016 Elsevier Patient Education  2020 [employer].

## 2019-10-10 ENCOUNTER — Other Ambulatory Visit: Payer: Self-pay

## 2019-10-10 ENCOUNTER — Encounter (HOSPITAL_COMMUNITY): Payer: Self-pay

## 2019-10-10 ENCOUNTER — Encounter (HOSPITAL_COMMUNITY)
Admission: RE | Admit: 2019-10-10 | Discharge: 2019-10-10 | Disposition: A | Discharge status: Home / Self Care | Payer: 59 | Source: Ambulatory Visit | Attending: Orthopedic Surgery | Admitting: Orthopedic Surgery

## 2019-10-10 ENCOUNTER — Other Ambulatory Visit (HOSPITAL_COMMUNITY)
Admission: RE | Admit: 2019-10-10 | Discharge: 2019-10-10 | Disposition: A | Discharge status: Home / Self Care | Payer: 59 | Source: Ambulatory Visit | Attending: Orthopedic Surgery | Admitting: Orthopedic Surgery

## 2019-10-10 DIAGNOSIS — Z01812 Encounter for preprocedural laboratory examination: Secondary | ICD-10-CM | POA: Insufficient documentation

## 2019-10-10 HISTORY — DX: Anxiety disorder, unspecified: F41.9

## 2019-10-10 HISTORY — DX: Depression, unspecified: F32.A

## 2019-10-10 LAB — CBC WITH DIFFERENTIAL/PLATELET
Abs Immature Granulocytes: 0.09 10*3/uL — ABNORMAL HIGH (ref 0.00–0.07)
Basophils Absolute: 0.1 10*3/uL (ref 0.0–0.1)
Basophils Relative: 1 %
Eosinophils Absolute: 0.2 10*3/uL (ref 0.0–0.5)
Eosinophils Relative: 1 %
HCT: 41.4 % (ref 36.0–46.0)
Hemoglobin: 13.4 g/dL (ref 12.0–15.0)
Immature Granulocytes: 1 %
Lymphocytes Relative: 26 %
Lymphs Abs: 3.5 10*3/uL (ref 0.7–4.0)
MCH: 27.9 pg (ref 26.0–34.0)
MCHC: 32.4 g/dL (ref 30.0–36.0)
MCV: 86.3 fL (ref 80.0–100.0)
Monocytes Absolute: 1 10*3/uL (ref 0.1–1.0)
Monocytes Relative: 8 %
Neutro Abs: 8.3 10*3/uL — ABNORMAL HIGH (ref 1.7–7.7)
Neutrophils Relative %: 63 %
Platelets: 266 10*3/uL (ref 150–400)
RBC: 4.8 MIL/uL (ref 3.87–5.11)
RDW: 14.8 % (ref 11.5–15.5)
WBC: 13.1 10*3/uL — ABNORMAL HIGH (ref 4.0–10.5)
nRBC: 0 % (ref 0.0–0.2)

## 2019-10-10 LAB — BASIC METABOLIC PANEL
Anion gap: 9 (ref 5–15)
BUN: 19 mg/dL (ref 6–20)
CO2: 20 mmol/L — ABNORMAL LOW (ref 22–32)
Calcium: 9 mg/dL (ref 8.9–10.3)
Chloride: 110 mmol/L (ref 98–111)
Creatinine, Ser: 1.2 mg/dL — ABNORMAL HIGH (ref 0.44–1.00)
GFR calc Af Amer: >60 mL/min (ref >60)
GFR calc non Af Amer: 57 mL/min — ABNORMAL LOW (ref >60)
Glucose, Bld: 82 mg/dL (ref 70–99)
Potassium: 3.1 mmol/L — ABNORMAL LOW (ref 3.5–5.1)
Sodium: 139 mmol/L (ref 135–145)

## 2019-10-10 LAB — HCG, SERUM, QUALITATIVE: Preg, Serum: NEGATIVE

## 2019-10-10 NOTE — Progress Notes (Signed)
Patient's blood pressure 190/112 and 188/102 at PAT visit.  Dr. Emeterio Reeve notified, as well as Dr. Romeo Apple.  Per [facility], patient has not had blood pressure medication filled since March of this year.  Per Dr. Romeo Apple, surgery to be cancelled and he will speak with patient regarding compliance.  Advised patient of above and that surgery could not be performed safely with blood pressure being out of control.  Verbalized understanding.

## 2019-10-12 ENCOUNTER — Encounter (HOSPITAL_COMMUNITY): Admission: RE | Payer: Self-pay | Source: Home / Self Care

## 2019-10-12 ENCOUNTER — Ambulatory Visit (HOSPITAL_COMMUNITY): Admission: RE | Admit: 2019-10-12 | Payer: 59 | Source: Home / Self Care | Admitting: Orthopedic Surgery

## 2019-10-12 ENCOUNTER — Encounter (HOSPITAL_COMMUNITY): Payer: Self-pay

## 2019-10-12 SURGERY — REPAIR, ROTATOR CUFF, ARTHROSCOPIC
Anesthesia: Choice | Site: Shoulder | Laterality: Right

## 2019-10-17 ENCOUNTER — Encounter (HOSPITAL_COMMUNITY): Payer: Self-pay | Admitting: Occupational Therapy

## 2019-10-17 ENCOUNTER — Ambulatory Visit (INDEPENDENT_AMBULATORY_CARE_PROVIDER_SITE_OTHER): Payer: 59 | Admitting: Orthopedic Surgery

## 2019-10-17 ENCOUNTER — Encounter: Payer: Self-pay | Admitting: Orthopedic Surgery

## 2019-10-17 ENCOUNTER — Other Ambulatory Visit: Payer: Self-pay

## 2019-10-17 VITALS — BP 99/77 | HR 104 | Ht 63.0 in | Wt 148.0 lb

## 2019-10-17 DIAGNOSIS — S46011D Strain of muscle(s) and tendon(s) of the rotator cuff of right shoulder, subsequent encounter: Secondary | ICD-10-CM | POA: Diagnosis not present

## 2019-10-17 NOTE — Addendum Note (Signed)
Addended byCaffie Damme on: 10/17/2019 04:04 PM   Modules accepted: Orders, SmartSet

## 2019-10-17 NOTE — Progress Notes (Signed)
Chief Complaint  Patient presents with  . Follow-up    Recheck on right shoulder.    39 year old female scheduled for surgery.  Patient went to surgery preop blood pressure was too high  According to preop nurse patient has been off of her blood pressure medicine for several months  BP seems normal now   BP 99/77   Pulse (!) 104   Ht 5\' 3"  (1.6 m)   Wt 148 lb (67.1 kg)   BMI 26.22 kg/m    Rt shoulder arthr rot cuff repair   bp normal on meds   Current Outpatient Medications:  .  cloNIDine (CATAPRES) 0.1 MG tablet, Take 0.1 mg by mouth 2 (two) times daily., Disp: , Rfl:  .  diclofenac Sodium (VOLTAREN) 1 % GEL, Apply 2 g topically 4 (four) times daily as needed. (Patient taking differently: Apply 2 g topically 4 (four) times daily as needed (pain). ), Disp: 50 g, Rfl: 0 .  ibuprofen (ADVIL) 800 MG tablet, Take 1 tablet (800 mg total) by mouth every 8 (eight) hours as needed. (Patient taking differently: Take 800 mg by mouth every 8 (eight) hours as needed for moderate pain. ), Disp: 21 tablet, Rfl: 0 .  medroxyPROGESTERone Acetate 150 MG/ML SUSY, Inject 150 mg into the muscle every 3 (three) months. , Disp: , Rfl:  .  methocarbamol (ROBAXIN) 500 MG tablet, Take 1 tablet (500 mg total) by mouth 3 (three) times daily., Disp: 60 tablet, Rfl: 1 .  olmesartan-hydrochlorothiazide (BENICAR HCT) 40-25 MG tablet, Take 1 tablet by mouth daily. , Disp: , Rfl:  .  traMADol-acetaminophen (ULTRACET) 37.5-325 MG tablet, Take 1 tablet by mouth every 4 (four) hours as needed. (Patient taking differently: Take 1 tablet by mouth every 4 (four) hours as needed for moderate pain. ), Disp: 90 tablet, Rfl: 5

## 2019-10-17 NOTE — Patient Instructions (Addendum)
Note for work   Look at last note and start oow from that day plus 3 mos after surgery

## 2019-10-19 ENCOUNTER — Encounter (HOSPITAL_COMMUNITY): Payer: Self-pay | Admitting: Occupational Therapy

## 2019-10-19 NOTE — Patient Instructions (Signed)
[name]  10/19/2019     @PREFPERIOPPHARMACY @   Your procedure is scheduled on  10/31/2019.  Report to 11/02/2019 at  0840  A.M.  Call this number if you have problems the morning of surgery:  [phone]   Remember:  Do not eat or drink after midnight.                          Take these medicines the morning of surgery with A SIP OF WATER   Robaxin(if needed), tramadol(if needed).    Do not wear jewelry, make-up or nail polish.  Do not wear lotions, powders, or perfumes, or deodorant. Please brush your teeth.  Do not shave 48 hours prior to surgery.  Men may shave face and neck.  Do not bring valuables to the hospital.  [facility] is not responsible for any belongings or valuables.  Contacts, dentures or bridgework may not be worn into surgery.  Leave your suitcase in the car.  After surgery it may be brought to your room.  For patients admitted to the hospital, discharge time will be determined by your treatment team.  Patients discharged the day of surgery will not be allowed to drive home.   Name and phone number of your driver:   family Special instructions:  DO NOT smoke the morning of your procedure.  Please read over the following fact sheets that you were given. Anesthesia Post-op Instructions and Care and Recovery After Surgery       Surgery for Rotator Cuff Tear, Care After This sheet gives you information about how to care for yourself after your procedure. Your health care provider may also give you more specific instructions. If you have problems or questions, contact your health care provider. What can I expect after the procedure? After the procedure, it is common to have:  Swelling.  Pain.  Stiffness.  Tenderness. Follow these instructions at home: If you have a sling or a shoulder immobilizer:  Wear it as told by your health care provider. Remove it only as told by your health care provider.  Loosen it if your fingers  tingle, become numb, or turn cold and blue.  Keep it clean. Bathing  Do not take baths, swim, or use a hot tub until your health care provider approves. Ask your health care provider if you may take showers. You may only be allowed to take sponge baths.  Keep your bandage (dressing) dry until your health care provider says it can be removed.  If your sling or shoulder immobilizer is not waterproof: ? Do not let it get wet. ? Remove it when you take a bath or shower as told by your health care provider. Once the sling or shoulder immobilizer is removed, try not to move your shoulder until your health care provider says that you can. Incision care   Follow instructions from your health care provider about how to take care of your incision. Make sure you: ? Wash your hands with soap and water before and after you change your dressing. If soap and water are not available, use hand sanitizer. ? Change your dressing as told by your health care provider. ? Leave stitches (sutures), skin glue, or adhesive strips in place. These skin closures may need to stay in place for 2 weeks or longer. If adhesive strip edges start to loosen and curl up, you may trim the loose  edges. Do not remove adhesive strips completely unless your health care provider tells you to do that.  Check your incision area every day for signs of infection. Check for: ? More redness, swelling, or pain. ? More fluid or blood. ? Warmth. ? Pus or a bad smell. Managing pain, stiffness, and swelling   If directed, put ice on your shoulder area. ? Put ice in a plastic bag. ? Place a towel between your skin and the bag. ? Leave the ice on for 20 minutes, 2-3 times a day.  Move your fingers often to reduce stiffness and swelling.  Raise (elevate) your upper body on pillows when you lie down and when you sleep. ? Do not sleep on the front of your body (abdomen). ? Do not sleep on the side that your surgery was performed  on. Medicines  Take over-the-counter and prescription medicines only as told by your health care provider.  Ask your health care provider if the medicine prescribed to you: ? Requires you to avoid driving or using heavy machinery. ? Can cause constipation. You may need to take actions to prevent or treat constipation, such as:  Drink enough fluid to keep your urine pale yellow.  Take over-the-counter or prescription medicines.  Eat foods that are high in fiber, such as beans, whole grains, and fresh fruits and vegetables.  Limit foods that are high in fat and processed sugars, such as fried or sweet foods. Driving  Do not drive for 24 hours if you were given a sedative during your procedure.  Do not drive while wearing a sling or a shoulder immobilizer. Ask your health care provider when it is safe to drive. Activity  Do not use your arm to support your body weight until your health care provider says that you can.  Do not lift or hold anything with your arm until your health care provider approves.  Return to your normal activities as told by your health care provider. Ask your health care provider what activities are safe for you.  Do exercises as told by your health care provider. General instructions  Do not use any products that contain nicotine or tobacco, such as cigarettes, e-cigarettes, and chewing tobacco. These can delay healing after surgery. If you need help quitting, ask your health care provider.  Keep all follow-up visits as told by your health care provider. This is important. Contact a health care provider if:  You have a fever.  You have more redness, swelling, or pain around your incision.  You have more fluid or blood coming from your incision.  Your incision feels warm to the touch.  You have pus or a bad smell coming from your incision.  You have pain that gets worse or does not get better with medicine. Get help right away if:  You have severe  pain.  You lose feeling in your arm or hand.  Your hand or fingers turn very pale or blue. Summary  If you have a sling, wear it as told by your health care provider. Remove it only as told by your health care provider.  Change your dressing as told by your health care provider. Check the incision area every day for signs of infection.  If directed, put ice on your shoulder area 2-3 times a day.  Do not use your arm to lift anything or to support your body weight until your health care provider says that you can. This information is not intended to replace advice  given to you by your health care provider. Make sure you discuss any questions you have with your health care provider. Document Revised: 11/08/2017 Document Reviewed: 11/10/2017 Elsevier Patient Education  2020 [employer]  General Anesthesia, Adult, Care After This sheet gives you information about how to care for yourself after your procedure. Your health care provider may also give you more specific instructions. If you have problems or questions, contact your health care provider. What can I expect after the procedure? After the procedure, the following side effects are common:  Pain or discomfort at the IV site.  Nausea.  Vomiting.  Sore throat.  Trouble concentrating.  Feeling cold or chills.  Weak or tired.  Sleepiness and fatigue.  Soreness and body aches. These side effects can affect parts of the body that were not involved in surgery. Follow these instructions at home:  For at least 24 hours after the procedure:  Have a responsible adult stay with you. It is important to have someone help care for you until you are awake and alert.  Rest as needed.  Do not: ? Participate in activities in which you could fall or become injured. ? Drive. ? Use heavy machinery. ? Drink alcohol. ? Take sleeping pills or medicines that cause drowsiness. ? Make important decisions or sign legal  documents. ? Take care of children on your own. Eating and drinking  Follow any instructions from your health care provider about eating or drinking restrictions.  When you feel hungry, start by eating small amounts of foods that are soft and easy to digest (bland), such as toast. Gradually return to your regular diet.  Drink enough fluid to keep your urine pale yellow.  If you vomit, rehydrate by drinking water, juice, or clear broth. General instructions  If you have sleep apnea, surgery and certain medicines can increase your risk for breathing problems. Follow instructions from your health care provider about wearing your sleep device: ? Anytime you are sleeping, including during daytime naps. ? While taking prescription pain medicines, sleeping medicines, or medicines that make you drowsy.  Return to your normal activities as told by your health care provider. Ask your health care provider what activities are safe for you.  Take over-the-counter and prescription medicines only as told by your health care provider.  If you smoke, do not smoke without supervision.  Keep all follow-up visits as told by your health care provider. This is important. Contact a health care provider if:  You have nausea or vomiting that does not get better with medicine.  You cannot eat or drink without vomiting.  You have pain that does not get better with medicine.  You are unable to pass urine.  You develop a skin rash.  You have a fever.  You have redness around your IV site that gets worse. Get help right away if:  You have difficulty breathing.  You have chest pain.  You have blood in your urine or stool, or you vomit blood. Summary  After the procedure, it is common to have a sore throat or nausea. It is also common to feel tired.  Have a responsible adult stay with you for the first 24 hours after general anesthesia. It is important to have someone help care for you until you  are awake and alert.  When you feel hungry, start by eating small amounts of foods that are soft and easy to digest (bland), such as toast. Gradually return to your regular diet.  Drink  enough fluid to keep your urine pale yellow.  Return to your normal activities as told by your health care provider. Ask your health care provider what activities are safe for you. This information is not intended to replace advice given to you by your health care provider. Make sure you discuss any questions you have with your health care provider. Document Revised: 02/05/2017 Document Reviewed: 09/18/2016 Elsevier Patient Education  2020 [employer]. How to Use Chlorhexidine for Bathing Chlorhexidine gluconate (CHG) is a germ-killing (antiseptic) solution that is used to clean the skin. It can get rid of the bacteria that normally live on the skin and can keep them away for about 24 hours. To clean your skin with CHG, you may be given:  A CHG solution to use in the shower or as part of a sponge bath.  A prepackaged cloth that contains CHG. Cleaning your skin with CHG may help lower the risk for infection:  While you are staying in the intensive care unit of the hospital.  If you have a vascular access, such as a central line, to provide short-term or long-term access to your veins.  If you have a catheter to drain urine from your bladder.  If you are on a ventilator. A ventilator is a machine that helps you breathe by moving air in and out of your lungs.  After surgery. What are the risks? Risks of using CHG include:  A skin reaction.  Hearing loss, if CHG gets in your ears.  Eye injury, if CHG gets in your eyes and is not rinsed out.  The CHG product catching fire. Make sure that you avoid smoking and flames after applying CHG to your skin. Do not use CHG:  If you have a chlorhexidine allergy or have previously reacted to chlorhexidine.  On babies younger than 84 months of age. How to  use CHG solution  Use CHG only as told by your health care provider, and follow the instructions on the label.  Use the full amount of CHG as directed. Usually, this is one bottle. During a shower Follow these steps when using CHG solution during a shower (unless your health care provider gives you different instructions): 1. Start the shower. 2. Use your normal soap and shampoo to wash your face and hair. 3. Turn off the shower or move out of the shower stream. 4. Pour the CHG onto a clean washcloth. Do not use any type of brush or rough-edged sponge. 5. Starting at your neck, lather your body down to your toes. Make sure you follow these instructions: ? If you will be having surgery, pay special attention to the part of your body where you will be having surgery. Scrub this area for at least 1 minute. ? Do not use CHG on your head or face. If the solution gets into your ears or eyes, rinse them well with water. ? Avoid your genital area. ? Avoid any areas of skin that have broken skin, cuts, or scrapes. ? Scrub your back and under your arms. Make sure to wash skin folds. 6. Let the lather sit on your skin for 1-2 minutes or as long as told by your health care provider. 7. Thoroughly rinse your entire body in the shower. Make sure that all body creases and crevices are rinsed well. 8. Dry off with a clean towel. Do not put any substances on your body afterward--such as powder, lotion, or perfume--unless you are told to do so by your health  care provider. Only use lotions that are recommended by the manufacturer. 9. Put on clean clothes or pajamas. 10. If it is the night before your surgery, sleep in clean sheets.  During a sponge bath Follow these steps when using CHG solution during a sponge bath (unless your health care provider gives you different instructions): 1. Use your normal soap and shampoo to wash your face and hair. 2. Pour the CHG onto a clean washcloth. 3. Starting at your  neck, lather your body down to your toes. Make sure you follow these instructions: ? If you will be having surgery, pay special attention to the part of your body where you will be having surgery. Scrub this area for at least 1 minute. ? Do not use CHG on your head or face. If the solution gets into your ears or eyes, rinse them well with water. ? Avoid your genital area. ? Avoid any areas of skin that have broken skin, cuts, or scrapes. ? Scrub your back and under your arms. Make sure to wash skin folds. 4. Let the lather sit on your skin for 1-2 minutes or as long as told by your health care provider. 5. Using a different clean, wet washcloth, thoroughly rinse your entire body. Make sure that all body creases and crevices are rinsed well. 6. Dry off with a clean towel. Do not put any substances on your body afterward--such as powder, lotion, or perfume--unless you are told to do so by your health care provider. Only use lotions that are recommended by the manufacturer. 7. Put on clean clothes or pajamas. 8. If it is the night before your surgery, sleep in clean sheets. How to use CHG prepackaged cloths  Only use CHG cloths as told by your health care provider, and follow the instructions on the label.  Use the CHG cloth on clean, dry skin.  Do not use the CHG cloth on your head or face unless your health care provider tells you to.  When washing with the CHG cloth: ? Avoid your genital area. ? Avoid any areas of skin that have broken skin, cuts, or scrapes. Before surgery Follow these steps when using a CHG cloth to clean before surgery (unless your health care provider gives you different instructions): 1. Using the CHG cloth, vigorously scrub the part of your body where you will be having surgery. Scrub using a back-and-forth motion for 3 minutes. The area on your body should be completely wet with CHG when you are done scrubbing. 2. Do not rinse. Discard the cloth and let the area  air-dry. Do not put any substances on the area afterward, such as powder, lotion, or perfume. 3. Put on clean clothes or pajamas. 4. If it is the night before your surgery, sleep in clean sheets.  For general bathing Follow these steps when using CHG cloths for general bathing (unless your health care provider gives you different instructions). 1. Use a separate CHG cloth for each area of your body. Make sure you wash between any folds of skin and between your fingers and toes. Wash your body in the following order, switching to a new cloth after each step: ? The front of your neck, shoulders, and chest. ? Both of your arms, under your arms, and your hands. ? Your stomach and groin area, avoiding the genitals. ? Your right leg and foot. ? Your left leg and foot. ? The back of your neck, your back, and your buttocks. 2. Do not rinse.  Discard the cloth and let the area air-dry. Do not put any substances on your body afterward--such as powder, lotion, or perfume--unless you are told to do so by your health care provider. Only use lotions that are recommended by the manufacturer. 3. Put on clean clothes or pajamas. Contact a health care provider if:  Your skin gets irritated after scrubbing.  You have questions about using your solution or cloth. Get help right away if:  Your eyes become very red or swollen.  Your eyes itch badly.  Your skin itches badly and is red or swollen.  Your hearing changes.  You have trouble seeing.  You have swelling or tingling in your mouth or throat.  You have trouble breathing.  You swallow any chlorhexidine. Summary  Chlorhexidine gluconate (CHG) is a germ-killing (antiseptic) solution that is used to clean the skin. Cleaning your skin with CHG may help to lower your risk for infection.  You may be given CHG to use for bathing. It may be in a bottle or in a prepackaged cloth to use on your skin. Carefully follow your health care provider's  instructions and the instructions on the product label.  Do not use CHG if you have a chlorhexidine allergy.  Contact your health care provider if your skin gets irritated after scrubbing. This information is not intended to replace advice given to you by your health care provider. Make sure you discuss any questions you have with your health care provider. Document Revised: 04/21/2018 Document Reviewed: 12/31/2016 Elsevier Patient Education  2020 [employer].

## 2019-10-25 ENCOUNTER — Other Ambulatory Visit: Payer: Self-pay

## 2019-10-25 ENCOUNTER — Encounter (HOSPITAL_COMMUNITY): Payer: Self-pay

## 2019-10-25 ENCOUNTER — Encounter (HOSPITAL_COMMUNITY)
Admission: RE | Admit: 2019-10-25 | Discharge: 2019-10-25 | Disposition: A | Discharge status: Home / Self Care | Payer: 59 | Source: Ambulatory Visit | Attending: Orthopedic Surgery | Admitting: Orthopedic Surgery

## 2019-10-25 DIAGNOSIS — Z01812 Encounter for preprocedural laboratory examination: Secondary | ICD-10-CM | POA: Diagnosis present

## 2019-10-25 LAB — BASIC METABOLIC PANEL
Anion gap: 10 (ref 5–15)
BUN: 20 mg/dL (ref 6–20)
CO2: 23 mmol/L (ref 22–32)
Calcium: 9.1 mg/dL (ref 8.9–10.3)
Chloride: 104 mmol/L (ref 98–111)
Creatinine, Ser: 1.25 mg/dL — ABNORMAL HIGH (ref 0.44–1.00)
GFR calc Af Amer: >60 mL/min (ref >60)
GFR calc non Af Amer: 54 mL/min — ABNORMAL LOW (ref >60)
Glucose, Bld: 97 mg/dL (ref 70–99)
Potassium: 3.7 mmol/L (ref 3.5–5.1)
Sodium: 137 mmol/L (ref 135–145)

## 2019-10-25 LAB — CBC WITH DIFFERENTIAL/PLATELET
Abs Immature Granulocytes: 0.15 10*3/uL — ABNORMAL HIGH (ref 0.00–0.07)
Basophils Absolute: 0.1 10*3/uL (ref 0.0–0.1)
Basophils Relative: 1 %
Eosinophils Absolute: 0.2 10*3/uL (ref 0.0–0.5)
Eosinophils Relative: 2 %
HCT: 44.9 % (ref 36.0–46.0)
Hemoglobin: 13.9 g/dL (ref 12.0–15.0)
Immature Granulocytes: 1 %
Lymphocytes Relative: 17 %
Lymphs Abs: 2.6 10*3/uL (ref 0.7–4.0)
MCH: 26.9 pg (ref 26.0–34.0)
MCHC: 31 g/dL (ref 30.0–36.0)
MCV: 87 fL (ref 80.0–100.0)
Monocytes Absolute: 1.2 10*3/uL — ABNORMAL HIGH (ref 0.1–1.0)
Monocytes Relative: 8 %
Neutro Abs: 11 10*3/uL — ABNORMAL HIGH (ref 1.7–7.7)
Neutrophils Relative %: 71 %
Platelets: 361 10*3/uL (ref 150–400)
RBC: 5.16 MIL/uL — ABNORMAL HIGH (ref 3.87–5.11)
RDW: 14.3 % (ref 11.5–15.5)
WBC: 15.3 10*3/uL — ABNORMAL HIGH (ref 4.0–10.5)
nRBC: 0 % (ref 0.0–0.2)

## 2019-10-25 LAB — HCG, SERUM, QUALITATIVE: Preg, Serum: NEGATIVE

## 2019-10-30 ENCOUNTER — Other Ambulatory Visit (HOSPITAL_COMMUNITY)
Admission: RE | Admit: 2019-10-30 | Discharge: 2019-10-30 | Disposition: A | Discharge status: Home / Self Care | Payer: 59 | Source: Ambulatory Visit | Attending: Orthopedic Surgery | Admitting: Orthopedic Surgery

## 2019-10-30 ENCOUNTER — Other Ambulatory Visit: Payer: Self-pay

## 2019-10-30 DIAGNOSIS — Z01812 Encounter for preprocedural laboratory examination: Secondary | ICD-10-CM | POA: Diagnosis present

## 2019-10-30 DIAGNOSIS — Z20822 Contact with and (suspected) exposure to covid-19: Secondary | ICD-10-CM | POA: Insufficient documentation

## 2019-10-30 LAB — SARS CORONAVIRUS 2 (TAT 6-24 HRS): SARS Coronavirus 2: NEGATIVE
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
## 2019-10-31 ENCOUNTER — Other Ambulatory Visit: Payer: Self-pay

## 2019-10-31 ENCOUNTER — Ambulatory Visit (HOSPITAL_COMMUNITY)
Admission: RE | Admit: 2019-10-31 | Discharge: 2019-10-31 | Disposition: A | Discharge status: Home / Self Care | Payer: 59 | Attending: Orthopedic Surgery | Admitting: Orthopedic Surgery

## 2019-10-31 ENCOUNTER — Ambulatory Visit (HOSPITAL_COMMUNITY): Payer: 59 | Admitting: Anesthesiology

## 2019-10-31 ENCOUNTER — Encounter (HOSPITAL_COMMUNITY): Payer: Self-pay | Admitting: Orthopedic Surgery

## 2019-10-31 ENCOUNTER — Encounter (HOSPITAL_COMMUNITY)
Admission: RE | Disposition: A | Discharge status: Home / Self Care | Payer: Self-pay | Source: Home / Self Care | Attending: Orthopedic Surgery

## 2019-10-31 ENCOUNTER — Encounter: Payer: Self-pay | Admitting: Radiology

## 2019-10-31 DIAGNOSIS — S46011A Strain of muscle(s) and tendon(s) of the rotator cuff of right shoulder, initial encounter: Secondary | ICD-10-CM | POA: Insufficient documentation

## 2019-10-31 DIAGNOSIS — M7551 Bursitis of right shoulder: Secondary | ICD-10-CM | POA: Diagnosis not present

## 2019-10-31 DIAGNOSIS — S46012A Strain of muscle(s) and tendon(s) of the rotator cuff of left shoulder, initial encounter: Secondary | ICD-10-CM

## 2019-10-31 DIAGNOSIS — S46012D Strain of muscle(s) and tendon(s) of the rotator cuff of left shoulder, subsequent encounter: Secondary | ICD-10-CM

## 2019-10-31 DIAGNOSIS — F17200 Nicotine dependence, unspecified, uncomplicated: Secondary | ICD-10-CM | POA: Insufficient documentation

## 2019-10-31 DIAGNOSIS — I10 Essential (primary) hypertension: Secondary | ICD-10-CM | POA: Diagnosis not present

## 2019-10-31 HISTORY — PX: ARTHOSCOPIC ROTAOR CUFF REPAIR: SHX5002

## 2019-10-31 SURGERY — REPAIR, ROTATOR CUFF, ARTHROSCOPIC
Anesthesia: Regional | Site: Shoulder | Laterality: Right

## 2019-10-31 MED ORDER — PREGABALIN 50 MG PO CAPS
50.0000 mg | ORAL_CAPSULE | Freq: Once | ORAL | Status: AC
  Administered 2019-10-31: 50 mg via ORAL

## 2019-10-31 MED ORDER — ROPIVACAINE HCL 5 MG/ML IJ SOLN
INTRAMUSCULAR | Status: DC | PRN
  Administered 2019-10-31: 18 mL via PERINEURAL

## 2019-10-31 MED ORDER — ESMOLOL HCL 100 MG/10ML IV SOLN
INTRAVENOUS | Status: AC
  Filled 2019-10-31: qty 10

## 2019-10-31 MED ORDER — PHENYLEPHRINE HCL-NACL 10-0.9 MG/250ML-% IV SOLN
INTRAVENOUS | Status: DC | PRN
  Administered 2019-10-31: 30 ug/min via INTRAVENOUS

## 2019-10-31 MED ORDER — LIDOCAINE 2% (20 MG/ML) 5 ML SYRINGE
INTRAMUSCULAR | Status: DC | PRN
  Administered 2019-10-31: 60 mg via INTRAVENOUS

## 2019-10-31 MED ORDER — MIDAZOLAM HCL 2 MG/2ML IJ SOLN
INTRAMUSCULAR | Status: AC
  Filled 2019-10-31: qty 2

## 2019-10-31 MED ORDER — PROPOFOL 10 MG/ML IV BOLUS
INTRAVENOUS | Status: AC
  Filled 2019-10-31: qty 20

## 2019-10-31 MED ORDER — EPINEPHRINE PF 1 MG/ML IJ SOLN
INTRAMUSCULAR | Status: AC
  Filled 2019-10-31: qty 8

## 2019-10-31 MED ORDER — CELECOXIB 400 MG PO CAPS
400.0000 mg | ORAL_CAPSULE | Freq: Once | ORAL | Status: AC
  Administered 2019-10-31: 400 mg via ORAL

## 2019-10-31 MED ORDER — PREGABALIN 50 MG PO CAPS
ORAL_CAPSULE | ORAL | Status: AC
  Filled 2019-10-31: qty 1

## 2019-10-31 MED ORDER — OXYCODONE HCL 5 MG PO TABS
ORAL_TABLET | ORAL | Status: AC
  Filled 2019-10-31: qty 1

## 2019-10-31 MED ORDER — ONDANSETRON HCL 4 MG/2ML IJ SOLN
4.0000 mg | Freq: Once | INTRAMUSCULAR | Status: AC
  Administered 2019-10-31: 4 mg via INTRAVENOUS

## 2019-10-31 MED ORDER — DEXAMETHASONE SODIUM PHOSPHATE 4 MG/ML IJ SOLN
INTRAMUSCULAR | Status: AC
  Filled 2019-10-31: qty 2

## 2019-10-31 MED ORDER — DEXAMETHASONE SODIUM PHOSPHATE 10 MG/ML IJ SOLN
INTRAMUSCULAR | Status: DC | PRN
  Administered 2019-10-31: 8 mg via INTRAVENOUS

## 2019-10-31 MED ORDER — MIDAZOLAM HCL 5 MG/5ML IJ SOLN
INTRAMUSCULAR | Status: DC | PRN
  Administered 2019-10-31: 2 mg via INTRAVENOUS

## 2019-10-31 MED ORDER — BUPIVACAINE-EPINEPHRINE (PF) 0.5% -1:200000 IJ SOLN
INTRAMUSCULAR | Status: AC
  Filled 2019-10-31: qty 30

## 2019-10-31 MED ORDER — HYDROMORPHONE HCL 1 MG/ML IJ SOLN: 0.2500 mg | INTRAMUSCULAR | Status: DC | PRN

## 2019-10-31 MED ORDER — TIZANIDINE HCL 4 MG PO TABS: 4.0000 mg | ORAL_TABLET | Freq: Three times a day (TID) | ORAL | 0 refills | Status: AC

## 2019-10-31 MED ORDER — METOPROLOL TARTRATE 5 MG/5ML IV SOLN
INTRAVENOUS | Status: AC
  Filled 2019-10-31: qty 5

## 2019-10-31 MED ORDER — CHLORHEXIDINE GLUCONATE 0.12 % MT SOLN
15.0000 mL | Freq: Once | OROMUCOSAL | Status: AC
  Administered 2019-10-31: 15 mL via OROMUCOSAL

## 2019-10-31 MED ORDER — PROPOFOL 10 MG/ML IV BOLUS
INTRAVENOUS | Status: DC | PRN
  Administered 2019-10-31: 150 mg via INTRAVENOUS

## 2019-10-31 MED ORDER — ROPIVACAINE HCL 5 MG/ML IJ SOLN
INTRAMUSCULAR | Status: AC
  Filled 2019-10-31: qty 30

## 2019-10-31 MED ORDER — LACTATED RINGERS IV SOLN: INTRAVENOUS | Status: DC | PRN

## 2019-10-31 MED ORDER — SUGAMMADEX SODIUM 200 MG/2ML IV SOLN
INTRAVENOUS | Status: DC | PRN
  Administered 2019-10-31: 200 mg via INTRAVENOUS

## 2019-10-31 MED ORDER — ROCURONIUM BROMIDE 10 MG/ML (PF) SYRINGE
PREFILLED_SYRINGE | INTRAVENOUS | Status: AC
  Filled 2019-10-31: qty 10

## 2019-10-31 MED ORDER — METOPROLOL TARTRATE 5 MG/5ML IV SOLN
INTRAVENOUS | Status: DC | PRN
  Administered 2019-10-31 (×3): 1 mg via INTRAVENOUS
  Administered 2019-10-31: 2 mg via INTRAVENOUS

## 2019-10-31 MED ORDER — OXYCODONE-ACETAMINOPHEN 5-325 MG PO TABS
1.0000 | ORAL_TABLET | ORAL | 0 refills | Status: AC | PRN
Start: 2019-10-31 — End: 2019-11-05

## 2019-10-31 MED ORDER — LIDOCAINE HCL (PF) 1 % IJ SOLN
INTRAMUSCULAR | Status: AC
  Filled 2019-10-31: qty 30

## 2019-10-31 MED ORDER — PHENYLEPHRINE HCL (PRESSORS) 10 MG/ML IV SOLN
INTRAVENOUS | Status: AC
  Filled 2019-10-31: qty 1

## 2019-10-31 MED ORDER — PROMETHAZINE HCL 25 MG/ML IJ SOLN: 6.2500 mg | INTRAMUSCULAR | Status: DC | PRN

## 2019-10-31 MED ORDER — METHOCARBAMOL 1000 MG/10ML IJ SOLN
500.0000 mg | Freq: Once | INTRAVENOUS | Status: AC
  Administered 2019-10-31: 500 mg via INTRAVENOUS
  Filled 2019-10-31: qty 5

## 2019-10-31 MED ORDER — IBUPROFEN 800 MG PO TABS: 800.0000 mg | ORAL_TABLET | Freq: Three times a day (TID) | ORAL | 1 refills | Status: DC | PRN

## 2019-10-31 MED ORDER — SODIUM CHLORIDE 0.9 % IR SOLN
Status: DC | PRN
  Administered 2019-10-31 (×3): 3000 mL

## 2019-10-31 MED ORDER — CEFAZOLIN SODIUM-DEXTROSE 2-4 GM/100ML-% IV SOLN
2.0000 g | INTRAVENOUS | Status: AC
  Administered 2019-10-31: 2 g via INTRAVENOUS

## 2019-10-31 MED ORDER — ORAL CARE MOUTH RINSE: 15.0000 mL | Freq: Once | OROMUCOSAL | Status: AC

## 2019-10-31 MED ORDER — ONDANSETRON HCL 4 MG/2ML IJ SOLN
INTRAMUSCULAR | Status: AC
  Filled 2019-10-31: qty 2

## 2019-10-31 MED ORDER — FENTANYL CITRATE (PF) 100 MCG/2ML IJ SOLN
INTRAMUSCULAR | Status: DC | PRN
  Administered 2019-10-31 (×2): 50 ug via INTRAVENOUS

## 2019-10-31 MED ORDER — BUPIVACAINE-EPINEPHRINE 0.5% -1:200000 IJ SOLN
INTRAMUSCULAR | Status: DC | PRN
  Administered 2019-10-31: 30 mL

## 2019-10-31 MED ORDER — PHENYLEPHRINE 40 MCG/ML (10ML) SYRINGE FOR IV PUSH (FOR BLOOD PRESSURE SUPPORT)
PREFILLED_SYRINGE | INTRAVENOUS | Status: DC | PRN
  Administered 2019-10-31 (×5): 80 ug via INTRAVENOUS

## 2019-10-31 MED ORDER — FENTANYL CITRATE (PF) 250 MCG/5ML IJ SOLN
INTRAMUSCULAR | Status: AC
  Filled 2019-10-31: qty 5

## 2019-10-31 MED ORDER — OXYCODONE HCL 5 MG PO TABS
5.0000 mg | ORAL_TABLET | Freq: Once | ORAL | Status: AC
  Administered 2019-10-31: 5 mg via ORAL

## 2019-10-31 MED ORDER — LIDOCAINE 2% (20 MG/ML) 5 ML SYRINGE
INTRAMUSCULAR | Status: AC
  Filled 2019-10-31: qty 5

## 2019-10-31 MED ORDER — ROCURONIUM BROMIDE 10 MG/ML (PF) SYRINGE
PREFILLED_SYRINGE | INTRAVENOUS | Status: DC | PRN
  Administered 2019-10-31: 70 mg via INTRAVENOUS

## 2019-10-31 MED ORDER — DEXAMETHASONE SODIUM PHOSPHATE 10 MG/ML IJ SOLN
INTRAMUSCULAR | Status: AC
  Filled 2019-10-31: qty 1

## 2019-10-31 MED ORDER — ONDANSETRON HCL 4 MG/2ML IJ SOLN
INTRAMUSCULAR | Status: DC | PRN
  Administered 2019-10-31: 4 mg via INTRAVENOUS

## 2019-10-31 MED ORDER — MEPERIDINE HCL 50 MG/ML IJ SOLN: 6.2500 mg | INTRAMUSCULAR | Status: DC | PRN

## 2019-10-31 MED ORDER — MIDAZOLAM HCL 2 MG/2ML IJ SOLN
2.0000 mg | Freq: Once | INTRAMUSCULAR | Status: AC
  Administered 2019-10-31: 2 mg via INTRAVENOUS
  Filled 2019-10-31: qty 2

## 2019-10-31 MED ORDER — PROMETHAZINE HCL 12.5 MG PO TABS: 12.5000 mg | ORAL_TABLET | Freq: Four times a day (QID) | ORAL | 0 refills | Status: DC | PRN

## 2019-10-31 MED ORDER — SODIUM CHLORIDE 0.9 % IR SOLN
Status: DC | PRN
  Administered 2019-10-31: 1000 mL

## 2019-10-31 MED ORDER — ESMOLOL HCL 100 MG/10ML IV SOLN
INTRAVENOUS | Status: DC | PRN
  Administered 2019-10-31: 30 mg via INTRAVENOUS
  Administered 2019-10-31: 20 mg via INTRAVENOUS

## 2019-10-31 MED ORDER — CEFAZOLIN SODIUM-DEXTROSE 2-4 GM/100ML-% IV SOLN
INTRAVENOUS | Status: AC
  Filled 2019-10-31: qty 100

## 2019-10-31 MED ORDER — DEXAMETHASONE SODIUM PHOSPHATE 4 MG/ML IJ SOLN
INTRAMUSCULAR | Status: DC | PRN
  Administered 2019-10-31: 8 mg via PERINEURAL

## 2019-10-31 MED ORDER — LIDOCAINE HCL (PF) 1 % IJ SOLN
INTRAMUSCULAR | Status: DC | PRN
  Administered 2019-10-31: 3 mL

## 2019-10-31 MED ORDER — CELECOXIB 400 MG PO CAPS
ORAL_CAPSULE | ORAL | Status: AC
  Filled 2019-10-31: qty 1

## 2019-10-31 MED ORDER — PHENYLEPHRINE 40 MCG/ML (10ML) SYRINGE FOR IV PUSH (FOR BLOOD PRESSURE SUPPORT)
PREFILLED_SYRINGE | INTRAVENOUS | Status: AC
  Filled 2019-10-31: qty 10

## 2019-10-31 MED ORDER — LACTATED RINGERS IV SOLN
Freq: Once | INTRAVENOUS | Status: AC
  Administered 2019-10-31: 1000 mL via INTRAVENOUS

## 2019-10-31 SURGICAL SUPPLY — 95 items
ABLATOR ASPIRATE 50D MULTI-PRT (SURGICAL WAND) ×3 IMPLANT
ANCH SUT SWLK 19.1X4.75 (Anchor) ×1 IMPLANT
ANCHOR SUT BIO SW 4.75X19.1 (Anchor) ×2 IMPLANT
APL PRP STRL LF DISP 70% ISPRP (MISCELLANEOUS) ×1
APL SKNCLS STERI-STRIP NONHPOA (GAUZE/BANDAGES/DRESSINGS) ×1
BENZOIN TINCTURE PRP APPL 2/3 (GAUZE/BANDAGES/DRESSINGS) ×3 IMPLANT
BIT DRILL 2.0X128 (BIT) IMPLANT
BIT DRILL 2.0X128MM (BIT)
BLADE AVERAGE 25MMX9MM (BLADE)
BLADE AVERAGE 25X9 (BLADE) IMPLANT
BLADE HEX COATED 2.75 (ELECTRODE) ×3 IMPLANT
BLADE SHAVER TORPEDO 4X13 (MISCELLANEOUS) ×2 IMPLANT
BLADE SURG SZ11 CARB STEEL (BLADE) ×3 IMPLANT
BNDG COHESIVE 4X5 TAN STRL (GAUZE/BANDAGES/DRESSINGS) ×3 IMPLANT
BURR OVAL 8 FLU 4.0MM X 13CM (MISCELLANEOUS) ×1
BURR OVAL 8 FLU 4.0X13 (MISCELLANEOUS) ×1 IMPLANT
CANNULA DRILOCK 5.0MMX75MM (CANNULA) ×1
CANNULA DRILOCK 5.0X75 (CANNULA) ×2 IMPLANT
CANNULA DRILOCK 6.5MMX75MM (CANNULA)
CANNULA DRILOCK 6.5X75 (CANNULA) IMPLANT
CANNULA DRILOCK 8.0MMX75MM (CANNULA)
CANNULA DRILOCK 8.0X75 (CANNULA) IMPLANT
CHLORAPREP W/TINT 26 (MISCELLANEOUS) ×3 IMPLANT
CLOSURE STERI STRIP 1/2 X4 (GAUZE/BANDAGES/DRESSINGS) ×2 IMPLANT
CLOSURE WOUND 1/2 X4 (GAUZE/BANDAGES/DRESSINGS)
CLOTH BEACON ORANGE TIMEOUT ST (SAFETY) ×3 IMPLANT
COVER LIGHT HANDLE STERIS (MISCELLANEOUS) ×6 IMPLANT
COVER WAND RF STERILE (DRAPES) ×3 IMPLANT
DECANTER SPIKE VIAL GLASS SM (MISCELLANEOUS) ×3 IMPLANT
DRAPE HALF SHEET 40X57 (DRAPES) ×3 IMPLANT
DRAPE SHOULDER BEACH CHAIR (DRAPES) ×3 IMPLANT
DRAPE U-SHAPE 47X51 STRL (DRAPES) ×3 IMPLANT
DRESSING MEPILEX BORDER 6X8 (GAUZE/BANDAGES/DRESSINGS) ×2 IMPLANT
DRSG MEPILEX BORDER 4X8 (GAUZE/BANDAGES/DRESSINGS) ×6 IMPLANT
DRSG MEPILEX BORDER 6X8 (GAUZE/BANDAGES/DRESSINGS) ×3
ELECT REM PT RETURN 9FT ADLT (ELECTROSURGICAL) ×3
ELECTRODE REM PT RTRN 9FT ADLT (ELECTROSURGICAL) ×1 IMPLANT
FIBERSTICK 2 (SUTURE) IMPLANT
GAUZE 4X4 16PLY RFD (DISPOSABLE) ×3 IMPLANT
GLOVE BIOGEL PI IND STRL 7.0 (GLOVE) ×3 IMPLANT
GLOVE BIOGEL PI INDICATOR 7.0 (GLOVE) ×6
GLOVE SKINSENSE NS SZ8.0 LF (GLOVE) ×4
GLOVE SKINSENSE STRL SZ8.0 LF (GLOVE) ×2 IMPLANT
GLOVE SS N UNI LF 8.5 STRL (GLOVE) ×3 IMPLANT
GOWN STRL REUS W/TWL LRG LVL3 (GOWN DISPOSABLE) ×6 IMPLANT
GOWN STRL REUS W/TWL XL LVL3 (GOWN DISPOSABLE) ×3 IMPLANT
INST SET MINOR BONE (KITS) ×3 IMPLANT
IV NS IRRIG 3000ML ARTHROMATIC (IV SOLUTION) ×8 IMPLANT
KIT BLADEGUARD II DBL (SET/KITS/TRAYS/PACK) ×3 IMPLANT
KIT POSITION SHOULDER SCHLEI (MISCELLANEOUS) ×3 IMPLANT
KIT TURNOVER KIT A (KITS) ×3 IMPLANT
MANIFOLD NEPTUNE II (INSTRUMENTS) ×3 IMPLANT
NDL HYPO 21X1.5 SAFETY (NEEDLE) ×1 IMPLANT
NDL MAYO 6 CRC TAPER PT (NEEDLE) IMPLANT
NDL SCORPION (NEEDLE) IMPLANT
NDL SCORPION MULTI FIRE (NEEDLE) IMPLANT
NDL SPNL 18GX3.5 QUINCKE PK (NEEDLE) ×1 IMPLANT
NEEDLE HYPO 21X1.5 SAFETY (NEEDLE) ×3 IMPLANT
NEEDLE MAYO 6 CRC TAPER PT (NEEDLE) IMPLANT
NEEDLE SCORPION (NEEDLE) IMPLANT
NEEDLE SCORPION MULTI FIRE (NEEDLE) ×3 IMPLANT
NEEDLE SPNL 18GX3.5 QUINCKE PK (NEEDLE) ×3 IMPLANT
NS IRRIG 1000ML POUR BTL (IV SOLUTION) ×3 IMPLANT
PACK TOTAL JOINT (CUSTOM PROCEDURE TRAY) ×3 IMPLANT
PAD ARMBOARD 7.5X6 YLW CONV (MISCELLANEOUS) ×3 IMPLANT
PASSER SUT CAPTURE FIRST (INSTRUMENTS) IMPLANT
PORT APPOLLO RF 90DEGREE MULTI (SURGICAL WAND) ×3 IMPLANT
RASP SM TEAR CROSS CUT (RASP) IMPLANT
SET ARTHROSCOPY INST (INSTRUMENTS) ×3 IMPLANT
SET BASIN LINEN APH (SET/KITS/TRAYS/PACK) ×3 IMPLANT
SLING ARM FOAM STRAP LRG (SOFTGOODS) ×3 IMPLANT
SLING ARM FOAM STRAP XLG (SOFTGOODS) ×3 IMPLANT
SLING ARM IMMOBILIZER MED (SOFTGOODS) IMPLANT
SPONGE GAUZE 2X2 8PLY STER LF (GAUZE/BANDAGES/DRESSINGS) ×1
SPONGE GAUZE 2X2 8PLY STRL LF (GAUZE/BANDAGES/DRESSINGS) ×2 IMPLANT
STRIP CLOSURE SKIN 1/2X4 (GAUZE/BANDAGES/DRESSINGS) IMPLANT
SUT BONE WAX W31G (SUTURE) IMPLANT
SUT ETHIBOND NAB OS 4 #2 30IN (SUTURE) IMPLANT
SUT FIBERWIRE #2 38 REV NDL BL (SUTURE)
SUT FIBERWIRE #2 38 T-5 BLUE (SUTURE)
SUT LASSO 45 DEGREE (SUTURE) IMPLANT
SUT LASSO 45 DEGREE LEFT (SUTURE) IMPLANT
SUT LASSO 45D RIGHT (SUTURE) IMPLANT
SUT MON AB 0 CT1 (SUTURE) ×3 IMPLANT
SUT MON AB 2-0 CT1 36 (SUTURE) ×3 IMPLANT
SUT PROLENE 2 0 FS (SUTURE) IMPLANT
SUT PROLENE 2 0 SH 30 (SUTURE) IMPLANT
SUT PROLENE 3 0 PS 1 (SUTURE) IMPLANT
SUT VIC AB 1 CT1 27 (SUTURE)
SUT VIC AB 1 CT1 27XBRD ANTBC (SUTURE) IMPLANT
SUTURE FIBERWR #2 38 T-5 BLUE (SUTURE) IMPLANT
SUTURE FIBERWR#2 38 REV NDL BL (SUTURE) IMPLANT
SYR 30ML LL (SYRINGE) ×3 IMPLANT
SYR BULB IRRIG 60ML STRL (SYRINGE) ×3 IMPLANT
YANKAUER SUCT 12FT TUBE ARGYLE (SUCTIONS) ×3 IMPLANT

## 2019-10-31 NOTE — Transfer of Care (Signed)
Immediate Anesthesia Transfer of Care Note  Patient: [name]  Procedure(s) Performed: ARTHROSCOPIC ROTATOR CUFF REPAIR (Right Shoulder)  Patient Location: PACU  Anesthesia Type:General and Regional  Level of Consciousness: drowsy  Airway & Oxygen Therapy: Patient Spontanous Breathing and Patient connected to nasal cannula oxygen  Post-op Assessment: Report given to RN and Post -op Vital signs reviewed and stable  Post vital signs: Reviewed and stable  Last Vitals:  Vitals Value Taken Time  BP 184/103 10/31/19 1254  Temp    Pulse    Resp 20 10/31/19 1258  SpO2    Vitals shown include unvalidated device data.  Last Pain:  Vitals:   10/31/19 0946  TempSrc: Oral  PainSc: 0-No pain      Patients Stated Pain Goal: 8 (10/31/19 0946)  Complications: No complications documented.

## 2019-10-31 NOTE — Anesthesia Postprocedure Evaluation (Signed)
Anesthesia Post Note  Patient: [name]  Procedure(s) Performed: ARTHROSCOPIC ROTATOR CUFF REPAIR (Right Shoulder)  Patient location during evaluation: PACU Anesthesia Type: General and Regional Level of consciousness: awake, oriented and awake and alert Pain management: pain level controlled Vital Signs Assessment: post-procedure vital signs reviewed and stable Respiratory status: spontaneous breathing, respiratory function stable and nonlabored ventilation Cardiovascular status: blood pressure returned to baseline and stable Postop Assessment: no apparent nausea or vomiting Anesthetic complications: no   No complications documented.   Last Vitals:  Vitals:   10/31/19 1255 10/31/19 1300  BP: (!) 184/103 (!) 172/97  Resp: 18 19  Temp: 36.5 C   SpO2: 99%     Last Pain:  Vitals:   10/31/19 1255  TempSrc:   PainSc: 0-No pain                 Julian Reil

## 2019-10-31 NOTE — Anesthesia Preprocedure Evaluation (Signed)
Anesthesia Evaluation  Patient identified by MRN, date of birth, ID band Patient awake    Reviewed: Allergy & Precautions, NPO status , Patient's Chart, lab work & pertinent test results, reviewed documented beta blocker date and time   History of Anesthesia Complications Negative for: history of anesthetic complications  Airway Mallampati: II  TM Distance: >3 FB Neck ROM: Full    Dental  (+) Dental Advisory Given   Pulmonary Current Smoker and Patient abstained from smoking.,    Pulmonary exam normal breath sounds clear to auscultation       Cardiovascular Exercise Tolerance: Good hypertension, Pt. on medications Normal cardiovascular exam Rhythm:Regular Rate:Normal     Neuro/Psych PSYCHIATRIC DISORDERS Anxiety Depression    GI/Hepatic negative GI ROS, Neg liver ROS,   Endo/Other  negative endocrine ROS  Renal/GU Renal disease  negative genitourinary   Musculoskeletal negative musculoskeletal ROS (+)   Abdominal   Peds  Hematology negative hematology ROS (+)   Anesthesia Other Findings   Reproductive/Obstetrics negative OB ROS                             Anesthesia Physical Anesthesia Plan  ASA: II  Anesthesia Plan: General   Post-op Pain Management:  Regional for Post-op pain   Induction: Intravenous  PONV Risk Score and Plan: 3 and Ondansetron, Dexamethasone and Midazolam  Airway Management Planned: Oral ETT  Additional Equipment:   Intra-op Plan:   Post-operative Plan: Extubation in OR  Informed Consent: I have reviewed the patients History and Physical, chart, labs and discussed the procedure including the risks, benefits and alternatives for the proposed anesthesia with the patient or authorized representative who has indicated his/her understanding and acceptance.     Dental advisory given  Plan Discussed with: CRNA and Surgeon  Anesthesia Plan Comments:          Anesthesia Quick Evaluation

## 2019-10-31 NOTE — Anesthesia Procedure Notes (Signed)
Anesthesia Regional Block: Interscalene brachial plexus block   Pre-Anesthetic Checklist: ,, timeout performed, Correct Patient, Correct Site, Correct Laterality, Correct Procedure, Correct Position, site marked, Risks and benefits discussed, at surgeon's request and post-op pain management  Laterality: Upper and Right  Prep: chloraprep       Needles:  Injection technique: Single-shot  Needle Type: Echogenic Stimulator Needle     Needle Length: 8.3cm  Needle Gauge: 22   Needle insertion depth: 5 cm   Additional Needles:   Procedures:,,,, ultrasound used (permanent image in chart),,,,  Narrative:  Start time: 10/31/2019 10:39 AM End time: 10/31/2019 10:47 AM  Performed by: Personally  Anesthesiologist: Molli Barrows, MD  Additional Notes: Block assessed prior to start of surgery

## 2019-10-31 NOTE — Brief Op Note (Signed)
10/31/2019  12:47 PM  PATIENT:  [name]  39 y.o. female  PRE-OPERATIVE DIAGNOSIS:  torn right rotator cuff  POST-OPERATIVE DIAGNOSIS:  torn right rotator cuff  PROCEDURE:  Procedure(s): ARTHROSCOPIC ROTATOR CUFF REPAIR (Right)   Implant: Swivel lock and suture tape Arthrex speed fix configuration  Findings normal glenohumeral joint with mild biceps inflammation  Subacromial bursitis  Anterior supraspinatus tendon full-thickness tear minimal retraction with excellent mobility  Surgery done as follows  Ms. Herbon was seen in preop surgical site confirmed and marked chart review completed patient cleared for surgery.  Patient taken to surgery general anesthesia brief exam under anesthesia was normal  Sterile prep and drape was performed timeout was completed  The patient was in the modified beachchair position  I made a posterior portal placed the scope in the joint and did a diagnostic arthroscopy.  There was some mild biceps inflammation but the biceps anchor was firmly attached with no glenohumeral joint disease  From the undersurface the rotator cuff looked completely intact  I placed the scope in the subacromial space and did a bursectomy through a lateral portal  By externally rotating the arm the rotator cuff tear was found it was a small tear with minimal retraction probably 1 cm front to back and 5 mm retraction.  With a tissue grasper the cuff came easily over to the tuberosity.  I debrided the cuff debrided the tuberosity and then placed a inverted mattress suture in the rotator cuff.  I then extended the lateral portal incision split the deltoid incised the humeral fascia and placed a swivel lock anchor.  I scope placed the scope back in the joint and checked the repair site the rotator cuff it was firmly attached to the tuberosity  We irrigated and closed with 2-0 Monocryl in 2 layers and 3-0 nylon to close the portals we injected Marcaine with  epinephrine  SURGEON:  Surgeon(s) and Role:    * Ramonda Galyon E, MD - Primary  PHYSICIAN ASSISTANT:   ASSISTANTS: debi dallas  ANESTHESIA:   general and paracervical block  EBL:  minimal  BLOOD ADMINISTERED:none  DRAINS: none   LOCAL MEDICATIONS USED:  MARCAINE     SPECIMEN:  No Specimen  DISPOSITION OF SPECIMEN:  N/A  COUNTS:  YES  TOURNIQUET:  * No tourniquets in log *  DICTATION: .Dragon Dictation  PLAN OF CARE: Discharge to home after PACU  PATIENT DISPOSITION:  PACU - hemodynamically stable.   Delay start of Pharmacological VTE agent (>24hrs) due to surgical blood loss or risk of bleeding: not applicable  

## 2019-10-31 NOTE — Anesthesia Procedure Notes (Signed)
Procedure Name: Intubation Date/Time: 10/31/2019 11:12 AM Performed by: Orlie Dakin, CRNA Pre-anesthesia Checklist: Patient identified, Emergency Drugs available, Suction available and Patient being monitored Patient Re-evaluated:Patient Re-evaluated prior to induction Oxygen Delivery Method: Circle system utilized Preoxygenation: Pre-oxygenation with 100% oxygen Induction Type: IV induction Ventilation: Mask ventilation without difficulty Laryngoscope Size: Mac and 4 Grade View: Grade II Tube type: Oral Tube size: 7.0 mm Number of attempts: 1 Airway Equipment and Method: Stylet Placement Confirmation: ETT inserted through vocal cords under direct vision,  positive ETCO2 and breath sounds checked- equal and bilateral Secured at: 23 cm Tube secured with: Tape Dental Injury: Teeth and Oropharynx as per pre-operative assessment  Comments: 4x4s bite block used at end of proc.

## 2019-10-31 NOTE — Interval H&P Note (Signed)
History and Physical Interval Note:  10/31/2019 10:56 AM  [name]  has presented today for surgery, with the diagnosis of torn right rotator cuff.  The various methods of treatment have been discussed with the patient and family. After consideration of risks, benefits and other options for treatment, the patient has consented to  Procedure(s): ARTHROSCOPIC ROTATOR CUFF REPAIR (Right) as a surgical intervention.  The patient's history has been reviewed, patient examined, no change in status, stable for surgery.  I have reviewed the patient's chart and labs.  Questions were answered to the patient's satisfaction.     Fuller Canada

## 2019-10-31 NOTE — Op Note (Signed)
10/31/2019  12:47 PM  PATIENT:  [name]  39 y.o. female  PRE-OPERATIVE DIAGNOSIS:  torn right rotator cuff  POST-OPERATIVE DIAGNOSIS:  torn right rotator cuff  PROCEDURE:  Procedure(s): ARTHROSCOPIC ROTATOR CUFF REPAIR (Right)   Implant: Swivel lock and suture tape Arthrex speed fix configuration  Findings normal glenohumeral joint with mild biceps inflammation  Subacromial bursitis  Anterior supraspinatus tendon full-thickness tear minimal retraction with excellent mobility  Surgery done as follows  Ms. Herbon was seen in preop surgical site confirmed and marked chart review completed patient cleared for surgery.  Patient taken to surgery general anesthesia brief exam under anesthesia was normal  Sterile prep and drape was performed timeout was completed  The patient was in the modified beachchair position  I made a posterior portal placed the scope in the joint and did a diagnostic arthroscopy.  There was some mild biceps inflammation but the biceps anchor was firmly attached with no glenohumeral joint disease  From the undersurface the rotator cuff looked completely intact  I placed the scope in the subacromial space and did a bursectomy through a lateral portal  By externally rotating the arm the rotator cuff tear was found it was a small tear with minimal retraction probably 1 cm front to back and 5 mm retraction.  With a tissue grasper the cuff came easily over to the tuberosity.  I debrided the cuff debrided the tuberosity and then placed a inverted mattress suture in the rotator cuff.  I then extended the lateral portal incision split the deltoid incised the humeral fascia and placed a swivel lock anchor.  I scope placed the scope back in the joint and checked the repair site the rotator cuff it was firmly attached to the tuberosity  We irrigated and closed with 2-0 Monocryl in 2 layers and 3-0 nylon to close the portals we injected Marcaine with  epinephrine  SURGEON:  Surgeon(s) and Role:    * Vickki Hearing, MD - Primary  PHYSICIAN ASSISTANT:   ASSISTANTS: debi dallas  ANESTHESIA:   general and paracervical block  EBL:  minimal  BLOOD ADMINISTERED:none  DRAINS: none   LOCAL MEDICATIONS USED:  MARCAINE     SPECIMEN:  No Specimen  DISPOSITION OF SPECIMEN:  N/A  COUNTS:  YES  TOURNIQUET:  * No tourniquets in log *  DICTATION: .Dragon Dictation  PLAN OF CARE: Discharge to home after PACU  PATIENT DISPOSITION:  PACU - hemodynamically stable.   Delay start of Pharmacological VTE agent (>24hrs) due to surgical blood loss or risk of bleeding: not applicable

## 2019-11-01 ENCOUNTER — Encounter (HOSPITAL_COMMUNITY): Payer: Self-pay | Admitting: Orthopedic Surgery

## 2019-11-03 DIAGNOSIS — Z9889 Other specified postprocedural states: Secondary | ICD-10-CM | POA: Insufficient documentation

## 2019-11-06 ENCOUNTER — Encounter: Payer: Self-pay | Admitting: Orthopedic Surgery

## 2019-11-06 ENCOUNTER — Other Ambulatory Visit: Payer: Self-pay

## 2019-11-06 ENCOUNTER — Ambulatory Visit (INDEPENDENT_AMBULATORY_CARE_PROVIDER_SITE_OTHER): Payer: 59 | Admitting: Orthopedic Surgery

## 2019-11-06 DIAGNOSIS — Z9889 Other specified postprocedural states: Secondary | ICD-10-CM

## 2019-11-06 MED ORDER — OXYCODONE-ACETAMINOPHEN 5-325 MG PO TABS: 1.0000 | ORAL_TABLET | ORAL | 0 refills | Status: AC | PRN

## 2019-11-06 NOTE — Progress Notes (Signed)
Chief Complaint  Patient presents with  . Routine Post Op    right shoulder 10/31/19    Postop day 6 status post arthroscopic rotator cuff repair right shoulder pain.  Wound check portals sutures removed  Wound is clean  Patient will start physical therapy  She can remove the sling 3 times a day to move the elbow  Follow-up in 3 weeks  Encounter Diagnosis  Name Primary?  . S/P right rotator cuff repair 10/31/19 Yes

## 2019-11-06 NOTE — Patient Instructions (Signed)
Shower ok   Start therapy

## 2019-11-27 ENCOUNTER — Ambulatory Visit (INDEPENDENT_AMBULATORY_CARE_PROVIDER_SITE_OTHER): Payer: 59 | Admitting: Orthopedic Surgery

## 2019-11-27 ENCOUNTER — Other Ambulatory Visit: Payer: Self-pay

## 2019-11-27 ENCOUNTER — Encounter: Payer: Self-pay | Admitting: Orthopedic Surgery

## 2019-11-27 DIAGNOSIS — Z9889 Other specified postprocedural states: Secondary | ICD-10-CM

## 2019-11-27 MED ORDER — HYDROCODONE-ACETAMINOPHEN 7.5-325 MG PO TABS: 1.0000 | ORAL_TABLET | Freq: Four times a day (QID) | ORAL | 0 refills | Status: DC | PRN

## 2019-11-27 NOTE — Addendum Note (Signed)
Addended by: Fuller Canada E on: 11/27/2019 11:58 AM   Modules accepted: Orders

## 2019-11-27 NOTE — Progress Notes (Signed)
Chief Complaint  Patient presents with  . Routine Post Op    right shoulder 10/31/19   Post op week 4   No therapy yet ???  May remove sling when in the house   Ok to shower   F/u 4 weeks

## 2019-11-27 NOTE — Patient Instructions (Addendum)
Physical therapy has been ordered for you at [facility]. They should call you to schedule, [phone] is the phone number to call please call them to schedle   For pain   Take 800 ibuprofen every 8 hrs   Take you muscle relaxer tizanidine   Take norco 7.5 mg every 4 hrs as needed and try to wean yourself from the norco in 4 weeks   Start Therapy

## 2019-11-29 ENCOUNTER — Other Ambulatory Visit: Payer: Self-pay

## 2019-11-29 ENCOUNTER — Ambulatory Visit (HOSPITAL_COMMUNITY): Payer: 59 | Attending: Orthopedic Surgery | Admitting: Occupational Therapy

## 2019-11-29 ENCOUNTER — Encounter (HOSPITAL_COMMUNITY): Payer: Self-pay | Admitting: Occupational Therapy

## 2019-11-29 DIAGNOSIS — R29898 Other symptoms and signs involving the musculoskeletal system: Secondary | ICD-10-CM | POA: Diagnosis present

## 2019-11-29 DIAGNOSIS — M25511 Pain in right shoulder: Secondary | ICD-10-CM

## 2019-11-29 DIAGNOSIS — M25611 Stiffness of right shoulder, not elsewhere classified: Secondary | ICD-10-CM | POA: Diagnosis present

## 2019-11-29 NOTE — Patient Instructions (Signed)
   1) SHOULDER: Flexion On Table   Place hands on table, elbows straight. Move hips away from body. Press hands down into table.  _15__ reps per set, __3_ sets per day  2) Abduction (Passive)   With arm out to side, resting on table, lower head toward arm, keeping trunk away from table.  Repeat _15___ times. Do __3__ sessions per day.  Copyright  VHI. All rights reserved.     3) Internal Rotation (Assistive)   Seated with elbow bent at right angle and held against side, slide arm on table surface in an inward arc. Repeat ___15_ times. Do __3__ sessions per day. Activity: Use this motion to brush crumbs off the table.  Copyright  VHI. All rights reserved.   COMPLETE PENDULUM EXERCISES FOR 30 SECONDS TO A MINUTE EACH, 3-5 TIMES PER DAY.  4) ROM: Pendulum (Side-to-Side)   Bend forward 90 at waist, using table for support. Rock side to side to swing arm for 1 minute.   Do __3__ sessions per day.  http://orth.exer.us/792   Copyright  VHI. All rights reserved.  5) Pendulum Forward/Back   Bend forward 90 at waist, using table for support. Rock body forward and back to swing arm for 1 minute.   Do __3__ sessions per day.  Copyright  VHI. All rights reserved.  6) Pendulum Circular   Bend forward 90 at waist, leaning on table for support. Rock body in a circular pattern to move arm clockwise then counterclockwise for 1 minute each. Do __3__ sessions per day.  Copyright  VHI. All rights reserved.  7) AROM: Wrist Extension   With right palm down, bend wrist up. Repeat 10____ times per set. Do _1___ sets per session. Do __3__ sessions per day.  Copyright  VHI. All rights reserved.   8) AROM: Wrist Flexion   With right palm up, bend wrist up. Repeat ___10_ times per set. Do __1__ sets per session. Do __3__ sessions per day.  Copyright  VHI. All rights reserved.   9) AROM: Forearm Pronation / Supination   With right arm in handshake position, slowly  rotate palm down until stretch is felt. Relax. Then rotate palm up until stretch is felt. Repeat __10__ times per set. Do _1___ sets per session. Do __3__ sessions per day.  Copyright  VHI. All rights reserved.   10) AFlexion (Passive)   Use other hand to bend elbow, with thumb toward same shoulder. Repeat __10-15__ times. Do _3___ sessions per day.
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
## 2019-12-05 ENCOUNTER — Ambulatory Visit (HOSPITAL_COMMUNITY): Payer: 59 | Admitting: Occupational Therapy

## 2019-12-05 ENCOUNTER — Encounter (HOSPITAL_COMMUNITY): Payer: Self-pay | Admitting: Occupational Therapy

## 2019-12-05 ENCOUNTER — Other Ambulatory Visit: Payer: Self-pay

## 2019-12-05 DIAGNOSIS — R29898 Other symptoms and signs involving the musculoskeletal system: Secondary | ICD-10-CM

## 2019-12-05 DIAGNOSIS — M25511 Pain in right shoulder: Secondary | ICD-10-CM | POA: Diagnosis not present

## 2019-12-05 DIAGNOSIS — M25611 Stiffness of right shoulder, not elsewhere classified: Secondary | ICD-10-CM

## 2019-12-05 NOTE — Therapy (Addendum)
[facility] [address] [place], [place], [address] Phone: [phone]   Fax:  [phone]  Occupational Therapy Treatment  Patient Details  Name: [name] MRN: [id number] Date of Birth: January 23, 1981 Referring Provider (OT): Dr. Fuller Canada   Encounter Date: 12/05/2019   OT End of Session - 12/05/19 0937    Visit Number 2    Number of Visits 16    Date for OT Re-Evaluation 01/28/20   mini reassessment 12/28/2019   Authorization Type [employer]; $20 copay    Authorization Time Period Visit limit 75, 5 used    Authorization - Visit Number 7    Authorization - Number of Visits 75    OT Start Time 0859    OT Stop Time 0942    OT Time Calculation (min) 43 min    Activity Tolerance Patient tolerated treatment well    Behavior During Therapy [facility] for tasks assessed/performed           Past Medical History:  Diagnosis Date  . Anxiety   . Depression   . Frequency of urination   . History of kidney stones   . Hypertension    followed by pcp  . Nephrolithiasis    CT 12-17-2018  left side nonobstructive stones, right ureter stone with obstruction  . Right ureteral stone   . SOB (shortness of breath)   . Urgency of urination     Past Surgical History:  Procedure Laterality Date  . APPENDECTOMY  age 78  . ARTHOSCOPIC ROTAOR CUFF REPAIR Right 10/31/2019   Procedure: ARTHROSCOPIC ROTATOR CUFF REPAIR;  Surgeon: Vickki Hearing, MD;  Location: [facility];  Service: Orthopedics;  Laterality: Right;  . BALLOON DILATION Left 01/17/2013   Procedure: BALLOON DILATION OF LEFT URETERAL STRICTURE;  Surgeon: Ky Barban, MD;  Location: [facility];  Service: Urology;  Laterality: Left;  . CYSTOSCOPY W/ URETERAL STENT PLACEMENT Left 01/17/2013   Procedure: CYSTOSCOPY WITH LEFT RETROGRADE PYELOGRAM; LEFT URETERAL STENT PLACEMENT;  Surgeon: Ky Barban, MD;  Location: [facility];  Service: Urology;  Laterality: Left;  . CYSTOSCOPY WITH  RETROGRADE PYELOGRAM, URETEROSCOPY AND STENT PLACEMENT Bilateral 12/23/2018   Procedure: CYSTOSCOPY WITH RETROGRADE PYELOGRAM, URETEROSCOPY AND STENT PLACEMENT;  Surgeon: Sebastian Ache, MD;  Location: [facility];  Service: Urology;  Laterality: Bilateral;  90 MINS  . CYSTOSCOPY WITH RETROGRADE PYELOGRAM, URETEROSCOPY AND STENT PLACEMENT Bilateral 01/06/2019   Procedure: CYSTOSCOPY WITH RETROGRADE PYELOGRAM, URETEROSCOPY AND STENT PLACEMENT;  Surgeon: Sebastian Ache, MD;  Location: [facility];  Service: Urology;  Laterality: Bilateral;  90 MINS  . CYSTOSCOPY/URETEROSCOPY/HOLMIUM LASER/STENT PLACEMENT  2008;  2010;  2011;  2012  . FLEXIBLE URETEROSCOPY Left 01/17/2013   Procedure: FLEXIBLE LEFT URETEROSCOPY; ATTEMPTED STONE BASKET EXTRACTION;  Surgeon: Ky Barban, MD;  Location: [facility];  Service: Urology;  Laterality: Left;  . HOLMIUM LASER APPLICATION Bilateral 12/23/2018   Procedure: HOLMIUM LASER APPLICATION;  Surgeon: Sebastian Ache, MD;  Location: [facility];  Service: Urology;  Laterality: Bilateral;  . HOLMIUM LASER APPLICATION Bilateral 01/06/2019   Procedure: HOLMIUM LASER APPLICATION;  Surgeon: Sebastian Ache, MD;  Location: [facility];  Service: Urology;  Laterality: Bilateral;    There were no vitals filed for this visit.   Subjective Assessment - 12/05/19 0900    Subjective  S: I tried the exercises but I didn't do very many.    Currently in Pain? Yes    Pain Score 3  Pain Location Shoulder    Pain Orientation Right    Pain Descriptors / Indicators Aching;Sore    Pain Type Acute pain    Pain Radiating Towards None    Pain Onset More than a month ago    Pain Frequency Intermittent    Aggravating Factors  movement    Pain Relieving Factors pain medication    Effect of Pain on Daily Activities unable to use RUE for ADLs    Multiple Pain Sites No              [facility] OT Assessment - 12/05/19 0859       Assessment   Medical Diagnosis s/p right arthroscopic RCR      Precautions   Precautions Shoulder    Type of Shoulder Precautions 10/12-week 4: pendulums, scapular A/ROM, joint mobilizations. 10/19-12/7: P/ROM progressing to A/ROM as tolerated. Scapular strengtheing, avoid heavy lifting. 12/8+ progress to strengthening as tolerated    Shoulder Interventions Shoulder sling/immobilizer   when in community                   OT Treatments/Exercises (OP) - 12/05/19 0901      Exercises   Exercises Shoulder      Shoulder Exercises: Supine   Protraction PROM;10 reps    Horizontal ABduction PROM;10 reps    External Rotation PROM;10 reps    Internal Rotation PROM;10 reps    Flexion PROM;10 reps    ABduction PROM;10 reps      Shoulder Exercises: Seated   Elevation AROM;10 reps    Extension AROM;10 reps    Row AROM;10 reps    Other Seated Exercises scapular depression, A/ROM, 10X      Shoulder Exercises: Therapy Ball   Flexion 10 reps    ABduction 10 reps      Shoulder Exercises: ROM/Strengthening   Pendulum 3 positions, 1' each      Shoulder Exercises: Isometric Strengthening   Flexion Supine;3X3"    Extension Supine;3X3"    External Rotation Supine;3X3"    Internal Rotation Supine;3X3"    ABduction Supine;3X3"    ADduction Supine;3X3"      Manual Therapy   Manual Therapy Myofascial release    Manual therapy comments Manual therapy completed separately from therapeutic exercises     Myofascial Release myofascial release and manual techniques completed to R upper arm, trapezius, and scapular regions to decrease pain and fascial restrictions and increase joint ROM                     OT Short Term Goals - 12/05/19 1062      OT SHORT TERM GOAL #1   Title Pt will be provided with and educated on HEP to improve mobility required for ADL completion.    Time 4    Period Weeks    Status On-going    Target Date 12/29/19      OT SHORT TERM GOAL #2    Title Pt will improve P/ROM of RUE to [facility] to improve ability to perform dressing tasks with minimal compensatory strategies.    Time 4    Period Weeks    Status On-going      OT SHORT TERM GOAL #3   Title Pt will improve strength in RUE to 3+/5 to improve ability to reach items at waist to shoulder height.    Time 4    Period Weeks    Status On-going  OT Long Term Goals - 12/05/19 6837      OT LONG TERM GOAL #1   Title Pt will decrease pain in RUE to 3/10 or less to improve ability to perform bathing tasks using RUE to reach and scrub.    Time 8    Period Weeks    Status On-going      OT LONG TERM GOAL #2   Title Pt will increase RUE A/ROM to [facility] to improve ability to perform functional reaching overhead and behind back.    Time 8    Period Weeks    Status On-going      OT LONG TERM GOAL #3   Title Pt will decrease fascial restrictions in RUE to minimal amounts or less to improve mobility required for overhead reaching.    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    Time 8    Period Weeks    Status On-going                 Plan - 12/05/19 2902    Clinical Impression Statement A: Continued with myofascial release and manual techniques to address fascial restrictions. Completed passive stretching, pt able to tolerate improved ROM this am, reports more pain on decent versus elevation. Initiated supine isometrics, seated scapular ROM, and reviewed pendulums. Completed therapy ball stretches. Verbal cuing for form and technique.    Body Structure / Function / Physical Skills ADL;Endurance;UE functional use;Fascial restriction;Pain;ROM;IADL;Strength;Muscle spasms;Mobility    Plan P: Continue with manual techniques and work on improve pt tolerance to P/ROM, begin anterior and caudle glides    OT Home Exercise Plan eval:  table slides, pendulums, wrist/forearm/elbow A/ROM    Consulted and Agree with Plan of Care Patient           Patient will benefit from skilled therapeutic intervention in order to improve the following deficits and impairments:   Body Structure / Function / Physical Skills: ADL, Endurance, UE functional use, Fascial restriction, Pain, ROM, IADL, Strength, Muscle spasms, Mobility       Visit Diagnosis: Acute pain of right shoulder  Stiffness of right shoulder, not elsewhere classified  Other symptoms and signs involving the musculoskeletal system    Problem List Patient Active Problem List   Diagnosis Date Noted  . S/P right rotator cuff repair 10/31/19 11/03/2019  . Traumatic complete tear of left rotator cuff   . Atrophy of left kidney 08/14/2013  . Bladder stone 06/13/2011  . 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 Personal history of noncompliance with medical treatment, presenting hazards to health 06/13/2011  . Retained foreign body 06/13/2011  . Urinary tract infection 06/13/2011  . Urinary tract obstruction due to kidney stone 12/08/2010  . Hydronephrosis 11/25/2010  . Recurrent nephrolithiasis 11/25/2010  . Renal colic on left side 11/07/2010  . Pyelonephritis 11/07/2010  . Dehydration 11/07/2010  . Hypokalemia 11/07/2010  . Flank pain 11/04/2010  . UTI (urinary tract infection) 11/04/2010   Ezra Sites, OTR/L  [phone] 12/05/2019, 9:44 AM  [facility] [address] [place], [place], [address] Phone: [phone]   Fax:  [phone]  Name: [name] MRN: [id number] Date of Birth: [birth year]

## 2019-12-07 ENCOUNTER — Other Ambulatory Visit: Payer: Self-pay

## 2019-12-07 ENCOUNTER — Encounter (HOSPITAL_COMMUNITY): Payer: Self-pay | Admitting: Occupational Therapy

## 2019-12-07 ENCOUNTER — Ambulatory Visit (HOSPITAL_COMMUNITY): Payer: 59 | Admitting: Occupational Therapy

## 2019-12-07 DIAGNOSIS — M25511 Pain in right shoulder: Secondary | ICD-10-CM | POA: Diagnosis not present

## 2019-12-07 DIAGNOSIS — M25611 Stiffness of right shoulder, not elsewhere classified: Secondary | ICD-10-CM

## 2019-12-07 DIAGNOSIS — R29898 Other symptoms and signs involving the musculoskeletal system: Secondary | ICD-10-CM

## 2019-12-07 NOTE — Therapy (Signed)
[facility] [address] [place], [place], [address] Phone: [phone]   Fax:  [phone]  Occupational Therapy Treatment  Patient Details  Name: [name] MRN: [id number] Date of Birth: 1980-03-14 Referring Provider (OT): Dr. Fuller Canada   Encounter Date: 12/07/2019   OT End of Session - 12/07/19 1548    Visit Number 3    Number of Visits 16    Date for OT Re-Evaluation 01/28/20   mini reassessment 12/28/2019   Authorization Type [employer]; $20 copay    Authorization Time Period Visit limit 75, 5 used    Authorization - Visit Number 8    Authorization - Number of Visits 75    OT Start Time 1517    OT Stop Time 1555    OT Time Calculation (min) 38 min    Activity Tolerance Patient tolerated treatment well    Behavior During Therapy [facility] for tasks assessed/performed           Past Medical History:  Diagnosis Date   Anxiety    Depression    Frequency of urination    History of kidney stones    Hypertension    followed by pcp   Nephrolithiasis    CT 12-17-2018  left side nonobstructive stones, right ureter stone with obstruction   Right ureteral stone    SOB (shortness of breath)    Urgency of urination     Past Surgical History:  Procedure Laterality Date   APPENDECTOMY  age 59   ARTHOSCOPIC 60 CUFF REPAIR Right 10/31/2019   Procedure: ARTHROSCOPIC ROTATOR CUFF REPAIR;  Surgeon: Vickki Hearing, MD;  Location: [facility];  Service: Orthopedics;  Laterality: Right;   BALLOON DILATION Left 01/17/2013   Procedure: BALLOON DILATION OF LEFT URETERAL STRICTURE;  Surgeon: Ky Barban, MD;  Location: [facility];  Service: Urology;  Laterality: Left;   CYSTOSCOPY W/ URETERAL STENT PLACEMENT Left 01/17/2013   Procedure: CYSTOSCOPY WITH LEFT RETROGRADE PYELOGRAM; LEFT URETERAL STENT PLACEMENT;  Surgeon: Ky Barban, MD;  Location: [facility];  Service: Urology;  Laterality: Left;   CYSTOSCOPY WITH  RETROGRADE PYELOGRAM, URETEROSCOPY AND STENT PLACEMENT Bilateral 12/23/2018   Procedure: CYSTOSCOPY WITH RETROGRADE PYELOGRAM, URETEROSCOPY AND STENT PLACEMENT;  Surgeon: Sebastian Ache, MD;  Location: [facility];  Service: Urology;  Laterality: Bilateral;  90 MINS   CYSTOSCOPY WITH RETROGRADE PYELOGRAM, URETEROSCOPY AND STENT PLACEMENT Bilateral 01/06/2019   Procedure: CYSTOSCOPY WITH RETROGRADE PYELOGRAM, URETEROSCOPY AND STENT PLACEMENT;  Surgeon: Sebastian Ache, MD;  Location: [facility];  Service: Urology;  Laterality: Bilateral;  90 MINS   CYSTOSCOPY/URETEROSCOPY/HOLMIUM LASER/STENT PLACEMENT  2008;  2010;  2011;  2012   FLEXIBLE URETEROSCOPY Left 01/17/2013   Procedure: FLEXIBLE LEFT URETEROSCOPY; ATTEMPTED STONE BASKET EXTRACTION;  Surgeon: Ky Barban, MD;  Location: [facility];  Service: Urology;  Laterality: Left;   HOLMIUM LASER APPLICATION Bilateral 12/23/2018   Procedure: HOLMIUM LASER APPLICATION;  Surgeon: Sebastian Ache, MD;  Location: [facility];  Service: Urology;  Laterality: Bilateral;   HOLMIUM LASER APPLICATION Bilateral 01/06/2019   Procedure: HOLMIUM LASER APPLICATION;  Surgeon: Sebastian Ache, MD;  Location: [facility];  Service: Urology;  Laterality: Bilateral;    There were no vitals filed for this visit.   Subjective Assessment - 12/07/19 1517    Subjective  S: I don't do the exercises as long as here but I do them.    Currently in Pain? No/denies  [facility] OT Assessment - 12/07/19 1516      Assessment   Medical Diagnosis s/p right arthroscopic RCR      Precautions   Precautions Shoulder    Type of Shoulder Precautions 10/12-week 4: pendulums, scapular A/ROM, joint mobilizations. 10/19-12/7: P/ROM progressing to A/ROM as tolerated. Scapular strengtheing, avoid heavy lifting. 12/8+ progress to strengthening as tolerated    Shoulder Interventions Shoulder sling/immobilizer    when in community                   OT Treatments/Exercises (OP) - 12/07/19 1517      Exercises   Exercises Shoulder      Shoulder Exercises: Supine   Protraction PROM;10 reps    Horizontal ABduction PROM;10 reps    External Rotation PROM;10 reps    Internal Rotation PROM;10 reps    Flexion PROM;10 reps    ABduction PROM;10 reps      Shoulder Exercises: Seated   Elevation AROM;10 reps    Extension AROM;10 reps    Row AROM;10 reps    Other Seated Exercises scapular depression, A/ROM, 10X      Shoulder Exercises: Pulleys   Flexion --    ABduction --      Shoulder Exercises: Therapy Ball   Flexion 10 reps    ABduction 10 reps      Shoulder Exercises: ROM/Strengthening   Anterior Glide 3X10"    Caudal Glide 3X10"      Shoulder Exercises: Isometric Strengthening   Flexion Supine;3X3"    Extension Supine;3X3"    External Rotation Supine;3X3"    Internal Rotation Supine;3X3"    ABduction Supine;3X3"    ADduction Supine;3X3"      Manual Therapy   Manual Therapy Myofascial release    Manual therapy comments Manual therapy completed separately from therapeutic exercises     Myofascial Release myofascial release and manual techniques completed to R upper arm, trapezius, and scapular regions to decrease pain and fascial restrictions and increase joint ROM                     OT Short Term Goals - 12/05/19 1497      OT SHORT TERM GOAL #1   Title Pt will be provided with and educated on HEP to improve mobility required for ADL completion.    Time 4    Period Weeks    Status On-going    Target Date 12/29/19      OT SHORT TERM GOAL #2   Title Pt will improve P/ROM of RUE to [facility] to improve ability to perform dressing tasks with minimal compensatory strategies.    Time 4    Period Weeks    Status On-going      OT SHORT TERM GOAL #3   Title Pt will improve strength in RUE to 3+/5 to improve ability to reach items at waist to shoulder height.    Time  4    Period Weeks    Status On-going             OT Long Term Goals - 12/05/19 0263      OT LONG TERM GOAL #1   Title Pt will decrease pain in RUE to 3/10 or less to improve ability to perform bathing tasks using RUE to reach and scrub.    Time 8    Period Weeks    Status On-going      OT LONG TERM GOAL #2   Title Pt will increase  RUE A/ROM to [facility] to improve ability to perform functional reaching overhead and behind back.    Time 8    Period Weeks    Status On-going      OT LONG TERM GOAL #3   Title Pt will decrease fascial restrictions in RUE to minimal amounts or less to improve mobility required for overhead reaching.    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    Time 8    Period Weeks    Status On-going                 Plan - 12/07/19 1541    Clinical Impression Statement A: Pt reports she is completing HEP with greater ease. Continued with manual techniques with good response. Continued with passive stretching, pt tolerating ROM to 50%, more pain with descent. Improved with joint distraction during passive stretching. Continued with isometrics and scapular ROM, added anterior and caudle shoulder glides today. Attempted pulleys however pt unable to complete due to catching and pain. Verbal cuing for form and technique.    Body Structure / Function / Physical Skills ADL;Endurance;UE functional use;Fascial restriction;Pain;ROM;IADL;Strength;Muscle spasms;Mobility    Plan P: Continue with manual techniques and work on improve pt tolerance to P/ROM, increase isometrics to 5" holds    OT Home Exercise Plan eval: table slides, pendulums, wrist/forearm/elbow A/ROM    Consulted and Agree with Plan of Care Patient           Patient will benefit from skilled therapeutic intervention in order to improve the following deficits and  impairments:   Body Structure / Function / Physical Skills: ADL, Endurance, UE functional use, Fascial restriction, Pain, ROM, IADL, Strength, Muscle spasms, Mobility       Visit Diagnosis: Acute pain of right shoulder  Stiffness of right shoulder, not elsewhere classified  Other symptoms and signs involving the musculoskeletal system    Problem List Patient Active Problem List   Diagnosis Date Noted   S/P right rotator cuff repair 10/31/19 11/03/2019   Traumatic complete tear of left rotator cuff    Atrophy of left kidney 08/14/2013   Bladder stone 06/13/2011   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   Personal history of noncompliance with medical treatment, presenting hazards to health 06/13/2011   Retained foreign body 06/13/2011   Urinary tract infection 06/13/2011   Urinary tract obstruction due to kidney stone 12/08/2010   Hydronephrosis 11/25/2010   Recurrent nephrolithiasis 11/25/2010   Renal colic on left side 11/07/2010   Pyelonephritis 11/07/2010   Dehydration 11/07/2010   Hypokalemia 11/07/2010   Flank pain 11/04/2010   UTI (urinary tract infection) 11/04/2010   Ezra Sites, OTR/L  [phone] 12/07/2019, 3:54 PM  [place] [facility] [address] [place], [place], [address] Phone: [phone]   Fax:  [phone]  Name: [name] MRN: [id number] Date of Birth: 03/08/1980

## 2019-12-11 ENCOUNTER — Ambulatory Visit (HOSPITAL_COMMUNITY): Payer: 59

## 2019-12-11 ENCOUNTER — Other Ambulatory Visit: Payer: Self-pay

## 2019-12-11 ENCOUNTER — Encounter (HOSPITAL_COMMUNITY): Payer: Self-pay

## 2019-12-11 DIAGNOSIS — M25611 Stiffness of right shoulder, not elsewhere classified: Secondary | ICD-10-CM

## 2019-12-11 DIAGNOSIS — M25511 Pain in right shoulder: Secondary | ICD-10-CM

## 2019-12-11 DIAGNOSIS — R29898 Other symptoms and signs involving the musculoskeletal system: Secondary | ICD-10-CM

## 2019-12-11 NOTE — Therapy (Addendum)
[facility] [address] [place], [place], [address] Phone: [phone]   Fax:  [phone]  Occupational Therapy Treatment  Patient Details  Name: [name] MRN: [id number] Date of Birth: July 22, 1980 Referring Provider (OT): Dr. Fuller CanadaStanley Harrison   Encounter Date: 12/11/2019   OT End of Session - 12/11/19 1721    Visit Number 4    Number of Visits 16    Date for OT Re-Evaluation 01/28/20   mini reassessment 12/28/2019   Authorization Type [employer]; $20 copay    Authorization Time Period Visit limit 75, 5 used    Authorization - Visit Number 9    Authorization - Number of Visits 75    OT Start Time 1643    OT Stop Time 1727    OT Time Calculation (min) 44 min    Activity Tolerance Patient tolerated treatment well    Behavior During Therapy [facility] for tasks assessed/performed           Past Medical History:  Diagnosis Date  . Anxiety   . Depression   . Frequency of urination   . History of kidney stones   . Hypertension    followed by pcp  . Nephrolithiasis    CT 12-17-2018  left side nonobstructive stones, right ureter stone with obstruction  . Right ureteral stone   . SOB (shortness of breath)   . Urgency of urination     Past Surgical History:  Procedure Laterality Date  . APPENDECTOMY  age 39  . ARTHOSCOPIC ROTAOR CUFF REPAIR Right 10/31/2019   Procedure: ARTHROSCOPIC ROTATOR CUFF REPAIR;  Surgeon: Vickki HearingHarrison, Stanley E, MD;  Location: [facility];  Service: Orthopedics;  Laterality: Right;  . BALLOON DILATION Left 01/17/2013   Procedure: BALLOON DILATION OF LEFT URETERAL STRICTURE;  Surgeon: Ky BarbanMohammad I Javaid, MD;  Location: [facility];  Service: Urology;  Laterality: Left;  . CYSTOSCOPY W/ URETERAL STENT PLACEMENT Left 01/17/2013   Procedure: CYSTOSCOPY WITH LEFT RETROGRADE PYELOGRAM; LEFT URETERAL STENT PLACEMENT;  Surgeon: Ky BarbanMohammad I Javaid, MD;  Location: [facility];  Service: Urology;  Laterality: Left;  . CYSTOSCOPY WITH  RETROGRADE PYELOGRAM, URETEROSCOPY AND STENT PLACEMENT Bilateral 12/23/2018   Procedure: CYSTOSCOPY WITH RETROGRADE PYELOGRAM, URETEROSCOPY AND STENT PLACEMENT;  Surgeon: Sebastian AcheManny, Theodore, MD;  [facility] [place];  Service: Urology;  Laterality: Bilateral;  90 MINS  . CYSTOSCOPY WITH RETROGRADE PYELOGRAM, URETEROSCOPY AND STENT PLACEMENT Bilateral 01/06/2019   Procedure: CYSTOSCOPY WITH RETROGRADE PYELOGRAM, URETEROSCOPY AND STENT PLACEMENT;  Surgeon: Sebastian AcheManny, Theodore, MD;  [facility] [place];  Service: Urology;  Laterality: Bilateral;  90 MINS  . CYSTOSCOPY/URETEROSCOPY/HOLMIUM LASER/STENT PLACEMENT  2008;  2010;  2011;  2012  . FLEXIBLE URETEROSCOPY Left 01/17/2013   Procedure: FLEXIBLE LEFT URETEROSCOPY; ATTEMPTED STONE BASKET EXTRACTION;  Surgeon: Ky BarbanMohammad I Javaid, MD;  Location: [facility];  Service: Urology;  Laterality: Left;  . HOLMIUM LASER APPLICATION Bilateral 12/23/2018   Procedure: HOLMIUM LASER APPLICATION;  Surgeon: Sebastian AcheManny, Theodore, MD;  [facility] [place];  Service: Urology;  Laterality: Bilateral;  . HOLMIUM LASER APPLICATION Bilateral 01/06/2019   Procedure: HOLMIUM LASER APPLICATION;  Surgeon: Sebastian AcheManny, Theodore, MD;  [facility] ;  Service: Urology;  Laterality: Bilateral;    There were no vitals filed for this visit.   Subjective Assessment - 12/11/19 1645    Subjective  S: I hate coming to therapy    Currently in Pain? Yes    Pain Score 2     Pain Location  Shoulder    Pain Orientation Right    Pain Descriptors / Indicators Sore;Aching    Pain Type Acute pain    Pain Radiating Towards none    Pain Onset More than a month ago    Pain Frequency Intermittent    Aggravating Factors  movement    Pain Relieving Factors pain meds    Effect of Pain on Daily Activities unable to use RUE for ADLs    Multiple Pain Sites No              [facility] OT Assessment - 12/11/19 1716      Assessment   Medical  Diagnosis s/p right arthroscopic RCR      Precautions   Precautions Shoulder    Type of Shoulder Precautions 10/12-week 4: pendulums, scapular A/ROM, joint mobilizations. 10/19-12/7: P/ROM progressing to A/ROM as tolerated. Scapular strengtheing, avoid heavy lifting. 12/8+ progress to strengthening as tolerated    Shoulder Interventions Shoulder sling/immobilizer                    OT Treatments/Exercises (OP) - 12/11/19 1647      Exercises   Exercises Shoulder      Shoulder Exercises: Supine   Protraction PROM;10 reps    Horizontal ABduction PROM;10 reps    External Rotation PROM;10 reps    Internal Rotation PROM;10 reps    Flexion PROM;10 reps    ABduction PROM;10 reps      Shoulder Exercises: Seated   Elevation AROM;10 reps    Extension AROM;10 reps    Retraction AROM;10 reps    Row AROM;10 reps      Shoulder Exercises: Therapy Ball   Flexion 10 reps    ABduction 10 reps      Shoulder Exercises: ROM/Strengthening   Thumb Tacks 1' waist level      Shoulder Exercises: Isometric Strengthening   Flexion Supine;3X5"    Extension Supine;3X5"    External Rotation Supine;3X5"    Internal Rotation Supine;3X5"    ABduction Supine;3X5"    ADduction Supine;3X5"      Manual Therapy   Manual Therapy Myofascial release    Manual therapy comments Manual therapy completed separately from therapeutic exercises     Myofascial Release myofascial release and manual techniques completed to right upper arm, trapezius, and scapular regions to decrease pain and fascial restrictions and increase joint ROM                   OT Education - 12/11/19 1730    Education Details Discussed using ice for soreness prevention and pain management, discussed duration of healing of up to a year to 18 months for shoulder to fully be better and discussed focus of therapy to get her to a functional return    Person(s) Educated Patient    Methods Explanation    Comprehension Verbalized  understanding            OT Short Term Goals - 12/05/19 0927      OT SHORT TERM GOAL #1   Title Pt will be provided with and educated on HEP to improve mobility required for ADL completion.    Time 4    Period Weeks    Status On-going    Target Date 12/29/19      OT SHORT TERM GOAL #2   Title Pt will improve P/ROM of RUE to [facility] to improve ability to perform dressing tasks with minimal compensatory strategies.    Time 4  Period Weeks    Status On-going      OT SHORT TERM GOAL #3   Title Pt will improve strength in RUE to 3+/5 to improve ability to reach items at waist to shoulder height.    Time 4    Period Weeks    Status On-going             OT Long Term Goals - 12/05/19 2248      OT LONG TERM GOAL #1   Title Pt will decrease pain in RUE to 3/10 or less to improve ability to perform bathing tasks using RUE to reach and scrub.    Time 8    Period Weeks    Status On-going      OT LONG TERM GOAL #2   Title Pt will increase RUE A/ROM to [facility] to improve ability to perform functional reaching overhead and behind back.    Time 8    Period Weeks    Status On-going      OT LONG TERM GOAL #3   Title Pt will decrease fascial restrictions in RUE to minimal amounts or less to improve mobility required for overhead reaching.    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    Time 8    Period Weeks    Status On-going                 Plan - 12/11/19 1727    Clinical Impression Statement A: Continued manual techniques and passive stretches with patient reporting that joint distraction continues to help shoulder discomfort and increases tolerance with P/ROM. Patient progressed to completing isometric holds for 5 seconds versus 3, and completed waist level thumb tacks with VC required for form and technique.    Body Structure /  Function / Physical Skills ADL;Endurance;UE functional use;Fascial restriction;Pain;ROM;IADL;Strength;Muscle spasms;Mobility    Plan P: continue isometrics and joint distraction with P/ROM, re-attempt pulleys if pain permits    Consulted and Agree with Plan of Care Patient           Patient will benefit from skilled therapeutic intervention in order to improve the following deficits and impairments:   Body Structure / Function / Physical Skills: ADL, Endurance, UE functional use, Fascial restriction, Pain, ROM, IADL, Strength, Muscle spasms, Mobility       Visit Diagnosis: Acute pain of right shoulder  Stiffness of right shoulder, not elsewhere classified  Other symptoms and signs involving the musculoskeletal system    Problem List Patient Active Problem List   Diagnosis Date Noted  . S/P right rotator cuff repair 10/31/19 11/03/2019  . Traumatic complete tear of left rotator cuff   . Atrophy of left kidney 08/14/2013  . Bladder stone 06/13/2011  . 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 Personal history of noncompliance with medical treatment, presenting hazards to health 06/13/2011  . Retained foreign body 06/13/2011  . Urinary tract infection 06/13/2011  . Urinary tract obstruction due to kidney stone 12/08/2010  . Hydronephrosis 11/25/2010  . Recurrent nephrolithiasis 11/25/2010  . Renal colic on left side 11/07/2010  . Pyelonephritis 11/07/2010  . Dehydration 11/07/2010  . Hypokalemia 11/07/2010  . Flank pain 11/04/2010  . UTI (urinary tract infection) 11/04/2010     [facility] [place], [place] Student [phone]   12/11/2019,  5:42 PM  [facility] [address] [place], [place], [address] Phone: [phone]   Fax:  [phone]  Name: ERZA MOTHERSHEAD MRN: [id number] Date of Birth: [birth year]

## 2019-12-13 ENCOUNTER — Ambulatory Visit (HOSPITAL_COMMUNITY): Payer: 59

## 2019-12-13 ENCOUNTER — Encounter (HOSPITAL_COMMUNITY): Payer: Self-pay

## 2019-12-13 ENCOUNTER — Other Ambulatory Visit: Payer: Self-pay

## 2019-12-13 DIAGNOSIS — M25511 Pain in right shoulder: Secondary | ICD-10-CM | POA: Diagnosis not present

## 2019-12-13 DIAGNOSIS — M25611 Stiffness of right shoulder, not elsewhere classified: Secondary | ICD-10-CM

## 2019-12-13 DIAGNOSIS — R29898 Other symptoms and signs involving the musculoskeletal system: Secondary | ICD-10-CM

## 2019-12-13 NOTE — Therapy (Signed)
[facility] [address] [place], [place], [address] Phone: [phone]   Fax:  [phone]  Occupational Therapy Treatment  Patient Details  Name: [name] MRN: [id number] Date of Birth: 03/16/1980 Referring Provider (OT): Dr. Fuller Canada   Encounter Date: 12/13/2019   OT End of Session - 12/13/19 1712    Visit Number 5    Number of Visits 16    Date for OT Re-Evaluation 01/28/20   mini reassessment 12/28/2019   Authorization Type [employer]; $20 copay    Authorization Time Period Visit limit 75, 5 used    Authorization - Visit Number 10    Authorization - Number of Visits 75    OT Start Time 1642    OT Stop Time 1722    OT Time Calculation (min) 40 min    Activity Tolerance Patient tolerated treatment well    Behavior During Therapy [facility] for tasks assessed/performed           Past Medical History:  Diagnosis Date   Anxiety    Depression    Frequency of urination    History of kidney stones    Hypertension    followed by pcp   Nephrolithiasis    CT 12-17-2018  left side nonobstructive stones, right ureter stone with obstruction   Right ureteral stone    SOB (shortness of breath)    Urgency of urination     Past Surgical History:  Procedure Laterality Date   APPENDECTOMY  age 14   ARTHOSCOPIC 17 CUFF REPAIR Right 10/31/2019   Procedure: ARTHROSCOPIC ROTATOR CUFF REPAIR;  Surgeon: Vickki Hearing, MD;  Location: [facility];  Service: Orthopedics;  Laterality: Right;   BALLOON DILATION Left 01/17/2013   Procedure: BALLOON DILATION OF LEFT URETERAL STRICTURE;  Surgeon: Ky Barban, MD;  Location: [facility];  Service: Urology;  Laterality: Left;   CYSTOSCOPY W/ URETERAL STENT PLACEMENT Left 01/17/2013   Procedure: CYSTOSCOPY WITH LEFT RETROGRADE PYELOGRAM; LEFT URETERAL STENT PLACEMENT;  Surgeon: Ky Barban, MD;  Location: [facility];  Service: Urology;  Laterality: Left;   CYSTOSCOPY WITH  RETROGRADE PYELOGRAM, URETEROSCOPY AND STENT PLACEMENT Bilateral 12/23/2018   Procedure: CYSTOSCOPY WITH RETROGRADE PYELOGRAM, URETEROSCOPY AND STENT PLACEMENT;  Surgeon: Sebastian Ache, MD;  Location: [facility];  Service: Urology;  Laterality: Bilateral;  90 MINS   CYSTOSCOPY WITH RETROGRADE PYELOGRAM, URETEROSCOPY AND STENT PLACEMENT Bilateral 01/06/2019   Procedure: CYSTOSCOPY WITH RETROGRADE PYELOGRAM, URETEROSCOPY AND STENT PLACEMENT;  Surgeon: Sebastian Ache, MD;  Location: [facility];  Service: Urology;  Laterality: Bilateral;  90 MINS   CYSTOSCOPY/URETEROSCOPY/HOLMIUM LASER/STENT PLACEMENT  2008;  2010;  2011;  2012   FLEXIBLE URETEROSCOPY Left 01/17/2013   Procedure: FLEXIBLE LEFT URETEROSCOPY; ATTEMPTED STONE BASKET EXTRACTION;  Surgeon: Ky Barban, MD;  Location: [facility];  Service: Urology;  Laterality: Left;   HOLMIUM LASER APPLICATION Bilateral 12/23/2018   Procedure: HOLMIUM LASER APPLICATION;  Surgeon: Sebastian Ache, MD;  Location: [facility];  Service: Urology;  Laterality: Bilateral;   HOLMIUM LASER APPLICATION Bilateral 01/06/2019   Procedure: HOLMIUM LASER APPLICATION;  Surgeon: Sebastian Ache, MD;  Location: [facility];  Service: Urology;  Laterality: Bilateral;    There were no vitals filed for this visit.   Subjective Assessment - 12/13/19 1645    Subjective  S: No reason to miss today, I want to get back to work    Currently in Pain? No/denies  [facility] OT Assessment - 12/13/19 1648      Assessment   Medical Diagnosis s/p right arthroscopic RCR      Precautions   Precautions Shoulder    Type of Shoulder Precautions 10/12-week 4: pendulums, scapular A/ROM, joint mobilizations. 10/19-12/7: P/ROM progressing to A/ROM as tolerated. Scapular strengtheing, avoid heavy lifting. 12/8+ progress to strengthening as tolerated    Shoulder Interventions Shoulder sling/immobilizer                     OT Treatments/Exercises (OP) - 12/13/19 1648      Exercises   Exercises Shoulder      Shoulder Exercises: Supine   Protraction PROM;10 reps    Horizontal ABduction PROM;10 reps    External Rotation PROM;10 reps    Internal Rotation PROM;10 reps    Flexion PROM;10 reps    ABduction PROM;10 reps      Shoulder Exercises: Seated   Elevation AROM;10 reps    Extension AROM;10 reps    Retraction AROM;10 reps    Row AROM;10 reps    Other Seated Exercises scapular depression, A/ROM, 10X      Shoulder Exercises: Pulleys   Flexion 1 minute    Scaption 1 minute      Shoulder Exercises: Isometric Strengthening   Flexion Supine;3X5"    Extension Supine;3X5"    External Rotation Supine;3X5"    Internal Rotation Supine;3X5"    ABduction Supine;3X5"    ADduction Supine;3X5"      Manual Therapy   Manual Therapy Myofascial release    Manual therapy comments Manual therapy completed separately from therapeutic exercises     Myofascial Release myofascial release and manual techniques completed to right upper arm, trapezius, and scapular regions to decrease pain and fascial restrictions and increase joint ROM                     OT Short Term Goals - 12/05/19 0927      OT SHORT TERM GOAL #1   Title Pt will be provided with and educated on HEP to improve mobility required for ADL completion.    Time 4    Period Weeks    Status On-going    Target Date 12/29/19      OT SHORT TERM GOAL #2   Title Pt will improve P/ROM of RUE to [facility] to improve ability to perform dressing tasks with minimal compensatory strategies.    Time 4    Period Weeks    Status On-going      OT SHORT TERM GOAL #3   Title Pt will improve strength in RUE to 3+/5 to improve ability to reach items at waist to shoulder height.    Time 4    Period Weeks    Status On-going             OT Long Term Goals - 12/05/19 6712      OT LONG TERM GOAL #1   Title Pt will decrease pain in  RUE to 3/10 or less to improve ability to perform bathing tasks using RUE to reach and scrub.    Time 8    Period Weeks    Status On-going      OT LONG TERM GOAL #2   Title Pt will increase RUE A/ROM to [facility] to improve ability to perform functional reaching overhead and behind back.    Time 8    Period Weeks    Status On-going  OT LONG TERM GOAL #3   Title Pt will decrease fascial restrictions in RUE to minimal amounts or less to improve mobility required for overhead reaching.    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    Time 8    Period Weeks    Status On-going                 Plan - 12/13/19 1714    Clinical Impression Statement A: Continued manual techniques to address fascial restrictions on the upper arm and trapezius region. Patient improved her tolerance with P/ROM this session, continuing to use traction for improvement in pain. Patient progressed to completing pulleys, with slight catching sensation reported with abduction. Exercise modified to completing pulleys with slight scaption and pain improved.    Body Structure / Function / Physical Skills ADL;Endurance;UE functional use;Fascial restriction;Pain;ROM;IADL;Strength;Muscle spasms;Mobility    Plan P: Increase isometric holds to 5", continue pulleys    Consulted and Agree with Plan of Care Patient           Patient will benefit from skilled therapeutic intervention in order to improve the following deficits and impairments:   Body Structure / Function / Physical Skills: ADL, Endurance, UE functional use, Fascial restriction, Pain, ROM, IADL, Strength, Muscle spasms, Mobility       Visit Diagnosis: Acute pain of right shoulder  Stiffness of right shoulder, not elsewhere classified  Other symptoms and signs involving the musculoskeletal system    Problem  List Patient Active Problem List   Diagnosis Date Noted   S/P right rotator cuff repair 10/31/19 11/03/2019   Traumatic complete tear of left rotator cuff    Atrophy of left kidney 08/14/2013   Bladder stone 06/13/2011   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 [place], [place], [address] Phone: [phone]   Fax:  [phone]  Name: [name] MRN: [id number] Date of Birth: [birth year]

## 2019-12-18 ENCOUNTER — Encounter (HOSPITAL_COMMUNITY): Payer: Self-pay

## 2019-12-18 ENCOUNTER — Other Ambulatory Visit: Payer: Self-pay

## 2019-12-18 ENCOUNTER — Ambulatory Visit (HOSPITAL_COMMUNITY): Payer: 59 | Attending: Orthopedic Surgery

## 2019-12-18 DIAGNOSIS — R29898 Other symptoms and signs involving the musculoskeletal system: Secondary | ICD-10-CM | POA: Diagnosis present

## 2019-12-18 DIAGNOSIS — M25611 Stiffness of right shoulder, not elsewhere classified: Secondary | ICD-10-CM | POA: Insufficient documentation

## 2019-12-18 DIAGNOSIS — M25511 Pain in right shoulder: Secondary | ICD-10-CM | POA: Diagnosis present

## 2019-12-18 NOTE — Therapy (Addendum)
[facility] [address] [place], [place], [address] Phone: [phone]   Fax:  [phone]  Occupational Therapy Treatment  Patient Details  Name: [name] MRN: [id number] Date of Birth: 05-31-1980 Referring Provider (OT): Dr. Fuller Canada   Encounter Date: 12/18/2019   OT End of Session - 12/18/19 1626    Visit Number 6    Number of Visits 16    Date for OT Re-Evaluation 01/28/20   mini reassessment 12/28/2019   Authorization Type [employer]; $20 copay    Authorization Time Period Visit limit 75, 5 used    Authorization - Visit Number 11    Authorization - Number of Visits 75    OT Start Time 1601    OT Stop Time 1639    OT Time Calculation (min) 38 min    Activity Tolerance Patient tolerated treatment well    Behavior During Therapy WFL for tasks assessed/performed           Past Medical History:  Diagnosis Date  . Anxiety   . Depression   . Frequency of urination   . History of kidney stones   . Hypertension    followed by pcp  . Nephrolithiasis    CT 12-17-2018  left side nonobstructive stones, right ureter stone with obstruction  . Right ureteral stone   . SOB (shortness of breath)   . Urgency of urination     Past Surgical History:  Procedure Laterality Date  . APPENDECTOMY  age 17  . ARTHOSCOPIC ROTAOR CUFF REPAIR Right 10/31/2019   Procedure: ARTHROSCOPIC ROTATOR CUFF REPAIR;  Surgeon: Vickki Hearing, MD;  Location: [facility];  Service: Orthopedics;  Laterality: Right;  . BALLOON DILATION Left 01/17/2013   Procedure: BALLOON DILATION OF LEFT URETERAL STRICTURE;  Surgeon: Ky Barban, MD;  Location: [facility];  Service: Urology;  Laterality: Left;  . CYSTOSCOPY W/ URETERAL STENT PLACEMENT Left 01/17/2013   Procedure: CYSTOSCOPY WITH LEFT RETROGRADE PYELOGRAM; LEFT URETERAL STENT PLACEMENT;  Surgeon: Ky Barban, MD;  Location: [facility];  Service: Urology;  Laterality: Left;  . CYSTOSCOPY WITH  RETROGRADE PYELOGRAM, URETEROSCOPY AND STENT PLACEMENT Bilateral 12/23/2018   Procedure: CYSTOSCOPY WITH RETROGRADE PYELOGRAM, URETEROSCOPY AND STENT PLACEMENT;  Surgeon: Sebastian Ache, MD;  Location: [facility];  Service: Urology;  Laterality: Bilateral;  90 MINS  . CYSTOSCOPY WITH RETROGRADE PYELOGRAM, URETEROSCOPY AND STENT PLACEMENT Bilateral 01/06/2019   Procedure: CYSTOSCOPY WITH RETROGRADE PYELOGRAM, URETEROSCOPY AND STENT PLACEMENT;  Surgeon: Sebastian Ache, MD;  Location: [facility];  Service: Urology;  Laterality: Bilateral;  90 MINS  . CYSTOSCOPY/URETEROSCOPY/HOLMIUM LASER/STENT PLACEMENT  2008;  2010;  2011;  2012  . FLEXIBLE URETEROSCOPY Left 01/17/2013   Procedure: FLEXIBLE LEFT URETEROSCOPY; ATTEMPTED STONE BASKET EXTRACTION;  Surgeon: Ky Barban, MD;  Location: [facility];  Service: Urology;  Laterality: Left;  . HOLMIUM LASER APPLICATION Bilateral 12/23/2018   Procedure: HOLMIUM LASER APPLICATION;  Surgeon: Sebastian Ache, MD;  Location: [facility];  Service: Urology;  Laterality: Bilateral;  . HOLMIUM LASER APPLICATION Bilateral 01/06/2019   Procedure: HOLMIUM LASER APPLICATION;  Surgeon: Sebastian Ache, MD;  Location: [facility];  Service: Urology;  Laterality: Bilateral;    There were no vitals filed for this visit.   Subjective Assessment - 12/18/19 1603    Subjective  S: I've been sleeping all day    Currently in Pain? Yes    Pain Score 3     Pain Location  Shoulder    Pain Orientation Right    Pain Descriptors / Indicators Sore;Aching    Pain Type Acute pain    Pain Radiating Towards none    Pain Onset More than a month ago    Pain Frequency Intermittent    Aggravating Factors  movement    Pain Relieving Factors pain meds    Effect of Pain on Daily Activities unable to use RUE for ADLs    Multiple Pain Sites No              [facility] OT Assessment - 12/18/19 1638      Assessment   Medical  Diagnosis s/p right arthroscopic RCR      Precautions   Precautions Shoulder    Type of Shoulder Precautions 10/12-week 4: pendulums, scapular A/ROM, joint mobilizations. 10/19-12/7: P/ROM progressing to A/ROM as tolerated. Scapular strengtheing, avoid heavy lifting. 12/8+ progress to strengthening as tolerated                    OT Treatments/Exercises (OP) - 12/18/19 1604      Exercises   Exercises Shoulder      Shoulder Exercises: Supine   Protraction PROM;10 reps    Horizontal ABduction PROM;10 reps    External Rotation PROM;10 reps    Internal Rotation PROM;10 reps    Flexion PROM;10 reps    ABduction PROM;10 reps      Shoulder Exercises: Seated   Elevation AROM;12 reps    Extension AROM;12 reps    Retraction AROM;12 reps    Row AROM;12 reps      Shoulder Exercises: Pulleys   Flexion 1 minute    Scaption 1 minute      Shoulder Exercises: ROM/Strengthening   Thumb Tacks 1' waist level      Shoulder Exercises: Isometric Strengthening   Flexion Supine;5X5"    Extension Supine;5X5"    External Rotation Supine;5X5"    Internal Rotation Supine;5X5"    ABduction Supine;5X5"    ADduction Supine;5X5"      Manual Therapy   Manual Therapy Myofascial release    Manual therapy comments Manual therapy completed separately from therapeutic exercises     Myofascial Release myofascial release and manual techniques completed to right upper arm, trapezius, and scapular regions to decrease pain and fascial restrictions and increase joint ROM                     OT Short Term Goals - 12/05/19 0927      OT SHORT TERM GOAL #1   Title Pt will be provided with and educated on HEP to improve mobility required for ADL completion.    Time 4    Period Weeks    Status On-going    Target Date 12/29/19      OT SHORT TERM GOAL #2   Title Pt will improve P/ROM of RUE to [facility] to improve ability to perform dressing tasks with minimal compensatory strategies.    Time 4     Period Weeks    Status On-going      OT SHORT TERM GOAL #3   Title Pt will improve strength in RUE to 3+/5 to improve ability to reach items at waist to shoulder height.    Time 4    Period Weeks    Status On-going             OT Long Term Goals - 12/05/19 0928      OT LONG TERM GOAL #  1   Title Pt will decrease pain in RUE to 3/10 or less to improve ability to perform bathing tasks using RUE to reach and scrub.    Time 8    Period Weeks    Status On-going      OT LONG TERM GOAL #2   Title Pt will increase RUE A/ROM to [facility] to improve ability to perform functional reaching overhead and behind back.    Time 8    Period Weeks    Status On-going      OT LONG TERM GOAL #3   Title Pt will decrease fascial restrictions in RUE to minimal amounts or less to improve mobility required for overhead reaching.    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    Time 8    Period Weeks    Status On-going                 Plan - 12/18/19 1627    Clinical Impression Statement A: Continued passive stretches, with slight scaption for flexion and traction applied to all motions with descent. Progressed isometric holds supine to 5X5", as well as increasing scapular A/ROM to 12 reps this date. Completed pulleys, with improvement in patient tolerance throughout this session.    Body Structure / Function / Physical Skills ADL;Endurance;UE functional use;Fascial restriction;Pain;ROM;IADL;Strength;Muscle spasms;Mobility    Plan P: Attempt PVC pipe slide and AA/ROM if pain permits and update HEP if applicable    Consulted and Agree with Plan of Care Patient           Patient will benefit from skilled therapeutic intervention in order to improve the following deficits and impairments:   Body Structure / Function / Physical Skills: ADL, Endurance, UE  functional use, Fascial restriction, Pain, ROM, IADL, Strength, Muscle spasms, Mobility       Visit Diagnosis: Stiffness of right shoulder, not elsewhere classified  Acute pain of right shoulder  Other symptoms and signs involving the musculoskeletal system    Problem List Patient Active Problem List   Diagnosis Date Noted  . S/P right rotator cuff repair 10/31/19 11/03/2019  . Traumatic complete tear of left rotator cuff   . Atrophy of left kidney 08/14/2013  . Bladder stone 06/13/2011  . 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 Personal history of noncompliance with medical treatment, presenting hazards to health 06/13/2011  . Retained foreign body 06/13/2011  . Urinary tract infection 06/13/2011  . Urinary tract obstruction due to kidney stone 12/08/2010  . Hydronephrosis 11/25/2010  . Recurrent nephrolithiasis 11/25/2010  . Renal colic on left side 11/07/2010  . Pyelonephritis 11/07/2010  . Dehydration 11/07/2010  . Hypokalemia 11/07/2010  . Flank pain 11/04/2010  . UTI (urinary tract infection) 11/04/2010    [place], [place] Student [id number]   12/18/2019, 5:44 PM  [facility] [address] [place], [place], [address] Phone: [phone]   Fax:  [phone]  Name: NEVIAH BRAUD MRN: [id number] Date of Birth: 20-Dec-1980

## 2019-12-20 ENCOUNTER — Ambulatory Visit (HOSPITAL_COMMUNITY): Payer: 59 | Admitting: Occupational Therapy

## 2019-12-20 ENCOUNTER — Encounter (HOSPITAL_COMMUNITY): Payer: Self-pay | Admitting: Occupational Therapy

## 2019-12-20 ENCOUNTER — Other Ambulatory Visit: Payer: Self-pay

## 2019-12-20 DIAGNOSIS — M25511 Pain in right shoulder: Secondary | ICD-10-CM

## 2019-12-20 DIAGNOSIS — R29898 Other symptoms and signs involving the musculoskeletal system: Secondary | ICD-10-CM

## 2019-12-20 DIAGNOSIS — M25611 Stiffness of right shoulder, not elsewhere classified: Secondary | ICD-10-CM

## 2019-12-20 NOTE — Therapy (Signed)
[place], [place], [address] Phone: [phone]   Fax:  [phone]  Occupational Therapy Treatment  Patient Details  Name: [name] MRN: [id number] Date of Birth: May 14, 1980 Referring Provider (OT): Dr. Arther Abbott   Encounter Date: 12/20/2019   OT End of Session - 12/20/19 1426    Visit Number 7    Number of Visits 16    Date for OT Re-Evaluation 01/28/20   mini reassessment 12/28/2019   Authorization Type [employer]; $20 copay    Authorization Time Period Visit limit 75, 5 used    Authorization - Visit Number 12    Authorization - Number of Visits 22    OT Start Time 1655    OT Stop Time 1425    OT Time Calculation (min) 38 min    Activity Tolerance Patient tolerated treatment well    Behavior During Therapy WFL for tasks assessed/performed           Past Medical History:  Diagnosis Date  . Anxiety   . Depression   . Frequency of urination   . History of kidney stones   . Hypertension    followed by pcp  . Nephrolithiasis    CT 12-17-2018  left side nonobstructive stones, right ureter stone with obstruction  . Right ureteral stone   . SOB (shortness of breath)   . Urgency of urination     Past Surgical History:  Procedure Laterality Date  . APPENDECTOMY  age 78  . ARTHOSCOPIC ROTAOR CUFF REPAIR Right 10/31/2019   Procedure: ARTHROSCOPIC ROTATOR CUFF REPAIR;  Surgeon: Carole Civil, MD;  Location: [facility];  Service: Orthopedics;  Laterality: Right;  . BALLOON DILATION Left 01/17/2013   Procedure: BALLOON DILATION OF LEFT URETERAL STRICTURE;  Surgeon: Marissa Nestle, MD;  Location: [facility];  Service: Urology;  Laterality: Left;  . CYSTOSCOPY W/ URETERAL STENT PLACEMENT Left 01/17/2013   Procedure: CYSTOSCOPY WITH LEFT RETROGRADE PYELOGRAM; LEFT URETERAL STENT PLACEMENT;  Surgeon: Marissa Nestle, MD;  Location: [facility];  Service: Urology;  Laterality: Left;  . CYSTOSCOPY WITH  RETROGRADE PYELOGRAM, URETEROSCOPY AND STENT PLACEMENT Bilateral 12/23/2018   Procedure: CYSTOSCOPY WITH RETROGRADE PYELOGRAM, URETEROSCOPY AND STENT PLACEMENT;  Surgeon: Alexis Frock, MD;  Location: [facility];  Service: Urology;  Laterality: Bilateral;  90 MINS  . CYSTOSCOPY WITH RETROGRADE PYELOGRAM, URETEROSCOPY AND STENT PLACEMENT Bilateral 01/06/2019   Procedure: CYSTOSCOPY WITH RETROGRADE PYELOGRAM, URETEROSCOPY AND STENT PLACEMENT;  Surgeon: Alexis Frock, MD;  Location: [facility];  Service: Urology;  Laterality: Bilateral;  90 MINS  . CYSTOSCOPY/URETEROSCOPY/HOLMIUM LASER/STENT PLACEMENT  2008;  2010;  2011;  2012  . FLEXIBLE URETEROSCOPY Left 01/17/2013   Procedure: FLEXIBLE LEFT URETEROSCOPY; ATTEMPTED STONE BASKET EXTRACTION;  Surgeon: Marissa Nestle, MD;  Location: [facility];  Service: Urology;  Laterality: Left;  . HOLMIUM LASER APPLICATION Bilateral 37/05/8268   Procedure: HOLMIUM LASER APPLICATION;  Surgeon: Alexis Frock, MD;  Location: [facility];  Service: Urology;  Laterality: Bilateral;  . HOLMIUM LASER APPLICATION Bilateral 78/67/5449   Procedure: HOLMIUM LASER APPLICATION;  Surgeon: Alexis Frock, MD;  Location: [facility];  Service: Urology;  Laterality: Bilateral;    There were no vitals filed for this visit.   Subjective Assessment - 12/20/19 1348    Subjective  S: I think I did too much when I put this shirt on.    Currently in Pain? Yes    Pain Score  4     Pain Location Shoulder    Pain Orientation Right    Pain Descriptors / Indicators Aching;Sore    Pain Type Acute pain    Pain Radiating Towards None    Pain Onset More than a month ago    Pain Frequency Intermittent    Aggravating Factors  movement, use    Pain Relieving Factors rest, pain medications    Effect of Pain on Daily Activities max effect on ADLs              [facility] OT Assessment - 12/20/19 1348      Assessment    Medical Diagnosis s/p right arthroscopic RCR      Precautions   Precautions Shoulder    Type of Shoulder Precautions 10/12-week 4: pendulums, scapular A/ROM, joint mobilizations. 10/19-12/7: P/ROM progressing to A/ROM as tolerated. Scapular strengtheing, avoid heavy lifting. 12/8+ progress to strengthening as tolerated                    OT Treatments/Exercises (OP) - 12/20/19 1349      Exercises   Exercises Shoulder      Shoulder Exercises: Supine   Protraction PROM;10 reps;AAROM;5 reps    Horizontal ABduction PROM;10 reps    External Rotation PROM;AAROM;10 reps    Internal Rotation PROM;AAROM;10 reps    Flexion PROM;10 reps    ABduction PROM;10 reps      Shoulder Exercises: Seated   Extension AROM;12 reps    Row AROM;12 reps      Shoulder Exercises: Pulleys   Flexion 1 minute    Scaption 1 minute      Shoulder Exercises: ROM/Strengthening   Thumb Tacks 1'     Prot/Ret//Elev/Dep 1'      Shoulder Exercises: Isometric Strengthening   Flexion --   standing 3X10"   Extension --   standing 3X10"   External Rotation --   standing 3X10"   ABduction --   standing 3X10"     Manual Therapy   Manual Therapy Myofascial release    Manual therapy comments Manual therapy completed separately from therapeutic exercises     Myofascial Release myofascial release and manual techniques completed to right upper arm, trapezius, and scapular regions to decrease pain and fascial restrictions and increase joint ROM                     OT Short Term Goals - 12/05/19 0927      OT SHORT TERM GOAL #1   Title Pt will be provided with and educated on HEP to improve mobility required for ADL completion.    Time 4    Period Weeks    Status On-going    Target Date 12/29/19      OT SHORT TERM GOAL #2   Title Pt will improve P/ROM of RUE to [facility] to improve ability to perform dressing tasks with minimal compensatory strategies.    Time 4    Period Weeks    Status On-going       OT SHORT TERM GOAL #3   Title Pt will improve strength in RUE to 3+/5 to improve ability to reach items at waist to shoulder height.    Time 4    Period Weeks    Status On-going             OT Long Term Goals - 12/05/19 0928      OT LONG TERM GOAL #1   Title Pt will  decrease pain in RUE to 3/10 or less to improve ability to perform bathing tasks using RUE to reach and scrub.    Time 8    Period Weeks    Status On-going      OT LONG TERM GOAL #2   Title Pt will increase RUE A/ROM to [facility] to improve ability to perform functional reaching overhead and behind back.    Time 8    Period Weeks    Status On-going      OT LONG TERM GOAL #3   Title Pt will decrease fascial restrictions in RUE to minimal amounts or less to improve mobility required for overhead reaching.    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    Time 8    Period Weeks    Status On-going                 Plan - 12/20/19 1416    Clinical Impression Statement A: Continued with manual therapy and passive stretching, pt continues to have catching sensation at 50% range, continued with traction to improve. Increased isometrics to 3X10" in standing, attempted AA/ROM in supine, pt with increased pain limiting success. Pt with increased pain at end of session, encouraged pt to take her pain medication and ice her shoulder.    Body Structure / Function / Physical Skills ADL;Endurance;UE functional use;Fascial restriction;Pain;ROM;IADL;Strength;Muscle spasms;Mobility    Plan P: Attempt PVC pipe slide and AA/ROM if pain permits and update HEP if applicable; TENS for pain    OT Home Exercise Plan eval: table slides, pendulums, wrist/forearm/elbow A/ROM    Consulted and Agree with Plan of Care Patient           Patient will benefit from skilled therapeutic intervention  in order to improve the following deficits and impairments:   Body Structure / Function / Physical Skills: ADL, Endurance, UE functional use, Fascial restriction, Pain, ROM, IADL, Strength, Muscle spasms, Mobility       Visit Diagnosis: Stiffness of right shoulder, not elsewhere classified  Acute pain of right shoulder  Other symptoms and signs involving the musculoskeletal system    Problem List Patient Active Problem List   Diagnosis Date Noted  . S/P right rotator cuff repair 10/31/19 11/03/2019  . Traumatic complete tear of left rotator cuff   . Atrophy of left kidney 08/14/2013  . Bladder stone 06/13/2011  . 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 Personal history of noncompliance with medical treatment, presenting hazards to health 06/13/2011  . Retained foreign body 06/13/2011  . Urinary tract infection 06/13/2011  . Urinary tract obstruction due to kidney stone 12/08/2010  . Hydronephrosis 11/25/2010  . Recurrent nephrolithiasis 11/25/2010  . Renal colic on left side 07/37/1062  . Pyelonephritis 11/07/2010  . Dehydration 11/07/2010  . Hypokalemia 11/07/2010  . Flank pain 11/04/2010  . UTI (urinary tract infection) 11/04/2010   Guadelupe Sabin, OTR/L  [phone] 12/20/2019, 2:28 PM  [place], [place], [address] Phone: [phone]   Fax:  [phone]  Name: [name] MRN: [id number] Date of Birth: November 03, 1980

## 2019-12-22 ENCOUNTER — Other Ambulatory Visit: Payer: Self-pay

## 2019-12-22 ENCOUNTER — Ambulatory Visit (HOSPITAL_COMMUNITY): Payer: 59 | Admitting: Occupational Therapy

## 2019-12-22 DIAGNOSIS — M25511 Pain in right shoulder: Secondary | ICD-10-CM

## 2019-12-22 DIAGNOSIS — M25611 Stiffness of right shoulder, not elsewhere classified: Secondary | ICD-10-CM | POA: Diagnosis not present

## 2019-12-22 DIAGNOSIS — R29898 Other symptoms and signs involving the musculoskeletal system: Secondary | ICD-10-CM

## 2019-12-22 NOTE — Therapy (Signed)
[place] [facility] [address] [place], [place], [address] Phone: [phone]   Fax:  [phone]  Occupational Therapy Treatment  Patient Details  Name: [name] MRN: [id number] Date of Birth: April 22, 1980 Referring Provider (OT): Dr. Fuller Canada   Encounter Date: 12/22/2019   OT End of Session - 12/22/19 1145    Visit Number 8    Number of Visits 16    Date for OT Re-Evaluation 01/28/20   mini reassessment 12/28/2019   Authorization Type [employer]; $20 copay    Authorization Time Period Visit limit 75, 5 used    Authorization - Visit Number 13    Authorization - Number of Visits 75    OT Start Time 1110    OT Stop Time 1150    OT Time Calculation (min) 40 min    Activity Tolerance Patient tolerated treatment well    Behavior During Therapy [facility] for tasks assessed/performed           Past Medical History:  Diagnosis Date  . Anxiety   . Depression   . Frequency of urination   . History of kidney stones   . Hypertension    followed by pcp  . Nephrolithiasis    CT 12-17-2018  left side nonobstructive stones, right ureter stone with obstruction  . Right ureteral stone   . SOB (shortness of breath)   . Urgency of urination     Past Surgical History:  Procedure Laterality Date  . APPENDECTOMY  age 34  . ARTHOSCOPIC ROTAOR CUFF REPAIR Right 10/31/2019   Procedure: ARTHROSCOPIC ROTATOR CUFF REPAIR;  Surgeon: Vickki Hearing, MD;  Location: [facility];  Service: Orthopedics;  Laterality: Right;  . BALLOON DILATION Left 01/17/2013   Procedure: BALLOON DILATION OF LEFT URETERAL STRICTURE;  Surgeon: Ky Barban, MD;  Location: [facility];  Service: Urology;  Laterality: Left;  . CYSTOSCOPY W/ URETERAL STENT PLACEMENT Left 01/17/2013   Procedure: CYSTOSCOPY WITH LEFT RETROGRADE PYELOGRAM; LEFT URETERAL STENT PLACEMENT;  Surgeon: Ky Barban, MD;  Location: [facility];  Service: Urology;  Laterality: Left;  . CYSTOSCOPY WITH  RETROGRADE PYELOGRAM, URETEROSCOPY AND STENT PLACEMENT Bilateral 12/23/2018   Procedure: CYSTOSCOPY WITH RETROGRADE PYELOGRAM, URETEROSCOPY AND STENT PLACEMENT;  Surgeon: Sebastian Ache, MD;  Location: [facility];  Service: Urology;  Laterality: Bilateral;  90 MINS  . CYSTOSCOPY WITH RETROGRADE PYELOGRAM, URETEROSCOPY AND STENT PLACEMENT Bilateral 01/06/2019   Procedure: CYSTOSCOPY WITH RETROGRADE PYELOGRAM, URETEROSCOPY AND STENT PLACEMENT;  Surgeon: Sebastian Ache, MD;  Location: [facility];  Service: Urology;  Laterality: Bilateral;  90 MINS  . CYSTOSCOPY/URETEROSCOPY/HOLMIUM LASER/STENT PLACEMENT  2008;  2010;  2011;  2012  . FLEXIBLE URETEROSCOPY Left 01/17/2013   Procedure: FLEXIBLE LEFT URETEROSCOPY; ATTEMPTED STONE BASKET EXTRACTION;  Surgeon: Ky Barban, MD;  Location: [facility];  Service: Urology;  Laterality: Left;  . HOLMIUM LASER APPLICATION Bilateral 12/23/2018   Procedure: HOLMIUM LASER APPLICATION;  Surgeon: Sebastian Ache, MD;  Location: [facility];  Service: Urology;  Laterality: Bilateral;  . HOLMIUM LASER APPLICATION Bilateral 01/06/2019   Procedure: HOLMIUM LASER APPLICATION;  Surgeon: Sebastian Ache, MD;  Location: [facility];  Service: Urology;  Laterality: Bilateral;    There were no vitals filed for this visit.   Subjective Assessment - 12/22/19 1110    Subjective  S: I didn't know if I was coming today.    Currently in Pain? Yes    Pain Score 2  Pain Location Shoulder    Pain Orientation Right    Pain Descriptors / Indicators Aching;Sore    Pain Type Acute pain    Pain Radiating Towards None    Pain Onset More than a month ago    Pain Frequency Intermittent    Aggravating Factors  movement, use    Pain Relieving Factors rest, pain medications    Effect of Pain on Daily Activities max effect on ADLs    Multiple Pain Sites No              [facility] OT Assessment - 12/22/19 1110       Assessment   Medical Diagnosis s/p right arthroscopic RCR      Precautions   Precautions Shoulder    Type of Shoulder Precautions 10/12-week 4: pendulums, scapular A/ROM, joint mobilizations. 10/19-12/7: P/ROM progressing to A/ROM as tolerated. Scapular strengtheing, avoid heavy lifting. 12/8+ progress to strengthening as tolerated                    OT Treatments/Exercises (OP) - 12/22/19 1111      Exercises   Exercises Shoulder      Shoulder Exercises: Supine   Protraction PROM;5 reps;AAROM;10 reps    Horizontal ABduction PROM;AAROM;5 reps    External Rotation PROM;5 reps;AAROM;10 reps    Internal Rotation PROM;5 reps;AAROM;10 reps    Flexion PROM;5 reps;AAROM;10 reps    ABduction PROM;5 reps;AAROM;10 reps    ABduction Limitations 2 finger assist from OT to unweight arm.       Modalities   Modalities Electrical Stimulation      Moist Heat Therapy   Number Minutes Moist Heat 10 Minutes    Moist Heat Location Shoulder      Electrical Stimulation   Electrical Stimulation Location right shoulder    Electrical Stimulation Action interferential     Electrical Stimulation Parameters 11.0 CV    Electrical Stimulation Goals Pain      Manual Therapy   Manual Therapy Myofascial release    Manual therapy comments Manual therapy completed separately from therapeutic exercises     Myofascial Release myofascial release and manual techniques completed to right upper arm, trapezius, and scapular regions to decrease pain and fascial restrictions and increase joint ROM                     OT Short Term Goals - 12/05/19 0927      OT SHORT TERM GOAL #1   Title Pt will be provided with and educated on HEP to improve mobility required for ADL completion.    Time 4    Period Weeks    Status On-going    Target Date 12/29/19      OT SHORT TERM GOAL #2   Title Pt will improve P/ROM of RUE to [facility] to improve ability to perform dressing tasks with minimal compensatory  strategies.    Time 4    Period Weeks    Status On-going      OT SHORT TERM GOAL #3   Title Pt will improve strength in RUE to 3+/5 to improve ability to reach items at waist to shoulder height.    Time 4    Period Weeks    Status On-going             OT Long Term Goals - 12/05/19 1191      OT LONG TERM GOAL #1   Title Pt will decrease pain in RUE to 3/10 or  less to improve ability to perform bathing tasks using RUE to reach and scrub.    Time 8    Period Weeks    Status On-going      OT LONG TERM GOAL #2   Title Pt will increase RUE A/ROM to [facility] to improve ability to perform functional reaching overhead and behind back.    Time 8    Period Weeks    Status On-going      OT LONG TERM GOAL #3   Title Pt will decrease fascial restrictions in RUE to minimal amounts or less to improve mobility required for overhead reaching.    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    Time 8    Period Weeks    Status On-going                 Plan - 12/22/19 1130    Clinical Impression Statement A: Pt reports she took ibuprofen prior to coming to session today. Continued with manual techniques and passive stretching, pt with improvement in tolerance to passive stretching today, tolerating approximately 110 degrees today for flexion and abduciton, 45 degrees for er. Added horizontal abduction in supine, pt with difficulty going across chest. Increased remainder of supine AA/ROM to 10 reps. Verbal cuing for form and technique. Pt reports pain at 5/10 after exercises compared to 2/10 on arrival. Utilized ES at end of session for pain management. Pt reporting improvement in pain after ES.    Body Structure / Function / Physical Skills ADL;Endurance;UE functional use;Fascial restriction;Pain;ROM;IADL;Strength;Muscle spasms;Mobility    Plan P:  Continue with AA/ROM supine and progress to sitting as able. update HEP for AA/ROM, ES as needed for pain. Follow up on MD visit.    OT Home Exercise Plan eval: table slides, pendulums, wrist/forearm/elbow A/ROM    Consulted and Agree with Plan of Care Patient           Patient will benefit from skilled therapeutic intervention in order to improve the following deficits and impairments:   Body Structure / Function / Physical Skills: ADL, Endurance, UE functional use, Fascial restriction, Pain, ROM, IADL, Strength, Muscle spasms, Mobility       Visit Diagnosis: Stiffness of right shoulder, not elsewhere classified  Acute pain of right shoulder  Other symptoms and signs involving the musculoskeletal system    Problem List Patient Active Problem List   Diagnosis Date Noted  . S/P right rotator cuff repair 10/31/19 11/03/2019  . Traumatic complete tear of left rotator cuff   . Atrophy of left kidney 08/14/2013  . Bladder stone 06/13/2011  . 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 Personal history of noncompliance with medical treatment, presenting hazards to health 06/13/2011  . Retained foreign body 06/13/2011  . Urinary tract infection 06/13/2011  . Urinary tract obstruction due to kidney stone 12/08/2010  . Hydronephrosis 11/25/2010  . Recurrent nephrolithiasis 11/25/2010  . Renal colic on left side 11/07/2010  . Pyelonephritis 11/07/2010  . Dehydration 11/07/2010  . Hypokalemia 11/07/2010  . Flank pain 11/04/2010  . UTI (urinary tract infection) 11/04/2010   Ezra Sites, OTR/L  [phone] 12/22/2019, 11:56 AM  [place] [facility] [address] [place], [place], [address] Phone: [phone]   Fax:  [phone]  Name: Emily  Mar Daring [name] MRN: [id number] Date of Birth: 03/01/1980

## 2019-12-25 ENCOUNTER — Encounter: Payer: Self-pay | Admitting: Orthopedic Surgery

## 2019-12-25 ENCOUNTER — Encounter (HOSPITAL_COMMUNITY): Payer: Self-pay

## 2019-12-25 ENCOUNTER — Ambulatory Visit (HOSPITAL_COMMUNITY): Payer: 59

## 2019-12-25 ENCOUNTER — Other Ambulatory Visit: Payer: Self-pay

## 2019-12-25 ENCOUNTER — Ambulatory Visit (INDEPENDENT_AMBULATORY_CARE_PROVIDER_SITE_OTHER): Payer: 59 | Admitting: Orthopedic Surgery

## 2019-12-25 VITALS — BP 197/109 | HR 10 | Resp 16 | Ht 63.0 in | Wt 154.4 lb

## 2019-12-25 DIAGNOSIS — M25611 Stiffness of right shoulder, not elsewhere classified: Secondary | ICD-10-CM

## 2019-12-25 DIAGNOSIS — M25511 Pain in right shoulder: Secondary | ICD-10-CM

## 2019-12-25 DIAGNOSIS — Z9889 Other specified postprocedural states: Secondary | ICD-10-CM

## 2019-12-25 DIAGNOSIS — R29898 Other symptoms and signs involving the musculoskeletal system: Secondary | ICD-10-CM

## 2019-12-25 NOTE — Patient Instructions (Signed)
WORK ON YOUR HOME EXERCISES 3 X A DAY   FU MONTHLY

## 2019-12-25 NOTE — Therapy (Signed)
[facility] [address] [place], [place], [address] Phone: [phone]   Fax:  [phone]  Occupational Therapy Treatment  Patient Details  Name: [name] MRN: [id number] Date of Birth: 10-20-1980 Referring Provider (OT): Dr. Fuller Canada   Encounter Date: 12/25/2019   OT End of Session - 12/25/19 1415    Visit Number 9    Number of Visits 16    Date for OT Re-Evaluation 01/28/20   mini reassessment 12/28/2019   Authorization Type [employer]; $20 copay    Authorization Time Period Visit limit 75, 5 used    Authorization - Visit Number 14    Authorization - Number of Visits 75    OT Start Time 1340    OT Stop Time 1421    OT Time Calculation (min) 41 min    Activity Tolerance Patient tolerated treatment well    Behavior During Therapy [name] for tasks assessed/performed           Past Medical History:  Diagnosis Date  . Anxiety   . Depression   . Frequency of urination   . History of kidney stones   . Hypertension    followed by pcp  . Nephrolithiasis    CT 12-17-2018  left side nonobstructive stones, right ureter stone with obstruction  . Right ureteral stone   . SOB (shortness of breath)   . Urgency of urination     Past Surgical History:  Procedure Laterality Date  . APPENDECTOMY  age 23  . ARTHOSCOPIC ROTAOR CUFF REPAIR Right 10/31/2019   Procedure: ARTHROSCOPIC ROTATOR CUFF REPAIR;  Surgeon: Vickki Hearing, MD;  Location: [facility];  Service: Orthopedics;  Laterality: Right;  . BALLOON DILATION Left 01/17/2013   Procedure: BALLOON DILATION OF LEFT URETERAL STRICTURE;  Surgeon: Ky Barban, MD;  Location: [facility];  Service: Urology;  Laterality: Left;  . CYSTOSCOPY W/ URETERAL STENT PLACEMENT Left 01/17/2013   Procedure: CYSTOSCOPY WITH LEFT RETROGRADE PYELOGRAM; LEFT URETERAL STENT PLACEMENT;  Surgeon: Ky Barban, MD;  Location: [facility];  Service: Urology;  Laterality: Left;  . CYSTOSCOPY WITH  RETROGRADE PYELOGRAM, URETEROSCOPY AND STENT PLACEMENT Bilateral 12/23/2018   Procedure: CYSTOSCOPY WITH RETROGRADE PYELOGRAM, URETEROSCOPY AND STENT PLACEMENT;  Surgeon: Sebastian Ache, MD;  Location: [facility];  Service: Urology;  Laterality: Bilateral;  90 MINS  . CYSTOSCOPY WITH RETROGRADE PYELOGRAM, URETEROSCOPY AND STENT PLACEMENT Bilateral 01/06/2019   Procedure: CYSTOSCOPY WITH RETROGRADE PYELOGRAM, URETEROSCOPY AND STENT PLACEMENT;  Surgeon: Sebastian Ache, MD;  Location: [facility];  Service: Urology;  Laterality: Bilateral;  90 MINS  . CYSTOSCOPY/URETEROSCOPY/HOLMIUM LASER/STENT PLACEMENT  2008;  2010;  2011;  2012  . FLEXIBLE URETEROSCOPY Left 01/17/2013   Procedure: FLEXIBLE LEFT URETEROSCOPY; ATTEMPTED STONE BASKET EXTRACTION;  Surgeon: Ky Barban, MD;  Location: [facility];  Service: Urology;  Laterality: Left;  . HOLMIUM LASER APPLICATION Bilateral 12/23/2018   Procedure: HOLMIUM LASER APPLICATION;  Surgeon: Sebastian Ache, MD;  Location: [facility];  Service: Urology;  Laterality: Bilateral;  . HOLMIUM LASER APPLICATION Bilateral 01/06/2019   Procedure: HOLMIUM LASER APPLICATION;  Surgeon: Sebastian Ache, MD;  Location: [facility];  Service: Urology;  Laterality: Bilateral;    There were no vitals filed for this visit.   Subjective Assessment - 12/25/19 1342    Subjective  S: My doctor said I can't go back to work until my ROM gets better    Currently in Pain? No/denies  [facility] OT Assessment - 12/25/19 1356      Assessment   Medical Diagnosis s/p right arthroscopic RCR      Precautions   Precautions Shoulder    Type of Shoulder Precautions 10/12-week 4: pendulums, scapular A/ROM, joint mobilizations. 10/19-12/7: P/ROM progressing to A/ROM as tolerated. Scapular strengtheing, avoid heavy lifting. 12/8+ progress to strengthening as tolerated                    OT  Treatments/Exercises (OP) - 12/25/19 1343      Exercises   Exercises Shoulder      Shoulder Exercises: Supine   Protraction PROM;5 reps;AAROM;10 reps    Horizontal ABduction PROM;5 reps;AAROM;10 reps;Limitations    Horizontal ABduction Limitations break after 5 reps, then 5 more    External Rotation PROM;5 reps;AAROM;10 reps    Internal Rotation PROM;5 reps;AAROM;10 reps    Flexion PROM;5 reps;AAROM;10 reps    ABduction PROM;5 reps;AAROM;10 reps    ABduction Limitations --      Shoulder Exercises: Seated   Extension AROM;12 reps    Retraction AROM;12 reps    Protraction AAROM;10 reps    Horizontal ABduction AAROM;10 reps    External Rotation AAROM;10 reps    Internal Rotation AAROM;10 reps    Flexion AAROM;10 reps    Flexion Limitations about 90 degrees    Abduction AAROM;10 reps      Manual Therapy   Manual Therapy Myofascial release    Manual therapy comments Manual therapy completed separately from therapeutic exercises     Myofascial Release myofascial release and manual techniques completed to right upper arm, trapezius, and scapular regions to decrease pain and fascial restrictions and increase joint ROM                   OT Education - 12/25/19 1415    Education Details AA/ROM supine/standing    Person(s) Educated Patient    Methods Explanation;Handout;Demonstration;Verbal cues    Comprehension Verbalized understanding;Returned demonstration;Verbal cues required            OT Short Term Goals - 12/05/19 0927      OT SHORT TERM GOAL #1   Title Pt will be provided with and educated on HEP to improve mobility required for ADL completion.    Time 4    Period Weeks    Status On-going    Target Date 12/29/19      OT SHORT TERM GOAL #2   Title Pt will improve P/ROM of RUE to [facility] to improve ability to perform dressing tasks with minimal compensatory strategies.    Time 4    Period Weeks    Status On-going      OT SHORT TERM GOAL #3   Title Pt will  improve strength in RUE to 3+/5 to improve ability to reach items at waist to shoulder height.    Time 4    Period Weeks    Status On-going             OT Long Term Goals - 12/05/19 4128      OT LONG TERM GOAL #1   Title Pt will decrease pain in RUE to 3/10 or less to improve ability to perform bathing tasks using RUE to reach and scrub.    Time 8    Period Weeks    Status On-going      OT LONG TERM GOAL #2   Title Pt will increase RUE A/ROM to [facility] to improve ability to perform  functional reaching overhead and behind back.    Time 8    Period Weeks    Status On-going      OT LONG TERM GOAL #3   Title Pt will decrease fascial restrictions in RUE to minimal amounts or less to improve mobility required for overhead reaching.    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    Time 8    Period Weeks    Status On-going                 Plan - 12/25/19 1418    Clinical Impression Statement A: Continued manual techniques and passive stretching. Patient progressed tolerance with AA/ROM supine, completing 10 reps of each movement with break in between 5 reps for horizontal abduction, and no OT assist for weight of arm throughout. Progressed to completing AA/ROM seated with VC required for form and technique.    Body Structure / Function / Physical Skills ADL;Endurance;UE functional use;Fascial restriction;Pain;ROM;IADL;Strength;Muscle spasms;Mobility    Plan P: Mini reassessment, continue with AA/ROM, ES as needed for pain at end of session    OT Home Exercise Plan eval: table slides, pendulums, wrist/forearm/elbow A/ROM 11/8: AA/ROM supine/seated    Consulted and Agree with Plan of Care Patient           Patient will benefit from skilled therapeutic intervention in order to improve the following deficits and impairments:   Body  Structure / Function / Physical Skills: ADL, Endurance, UE functional use, Fascial restriction, Pain, ROM, IADL, Strength, Muscle spasms, Mobility       Visit Diagnosis: Stiffness of right shoulder, not elsewhere classified  Acute pain of right shoulder  Other symptoms and signs involving the musculoskeletal system    Problem List Patient Active Problem List   Diagnosis Date Noted  . S/P right rotator cuff repair 10/31/19 11/03/2019  . Traumatic complete tear of left rotator cuff   . Atrophy of left kidney 08/14/2013  . Bladder stone 06/13/2011  . 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 Personal history of noncompliance with medical treatment, presenting hazards to health 06/13/2011  . Retained foreign body 06/13/2011  . Urinary tract infection 06/13/2011  . Urinary tract obstruction due to kidney stone 12/08/2010  . Hydronephrosis 11/25/2010  . Recurrent nephrolithiasis 11/25/2010  . Renal colic on left side 11/07/2010  . Pyelonephritis 11/07/2010  . Dehydration 11/07/2010  . Hypokalemia 11/07/2010  . Flank pain 11/04/2010  . UTI (urinary tract infection) 11/04/2010     [place], [place] Student [phone]   12/25/2019, 2:25 PM  [place] [facility] [address] [place], [place], [address] Phone: [phone]   Fax:  [phone]  Name: GENNI BUSKE MRN: [id number] Date of Birth: 03/25/1980

## 2019-12-25 NOTE — Progress Notes (Signed)
Chief Complaint  Patient presents with   s/p rotator cuff repair right    right   10/31/19 DOS  Ms. [name] had a cuff repair in September she is working on the second month she is a little stiff there is a lot of scapular substitution her passive motion at her side is 40 degrees external rotation her passive flexion is about 120 degrees  Recommend continued physical therapy come back in 1 month  Encounter Diagnosis  Name Primary?   S/P right rotator cuff repair 10/31/19 Yes

## 2019-12-25 NOTE — Patient Instructions (Signed)
Perform each exercise 10 times then increase the reps as you feel able to 12-15 reps per exercise. At least once a day.   1) Protraction   Start by holding a wand or cane at chest height.  Next, slowly push the wand outwards in front of your body so that your elbows become fully straightened. Then, return to the original position.     2) Shoulder FLEXION   In the standing position, hold a wand/cane with both arms, palms down on both sides. Raise up the wand/cane allowing your unaffected arm to perform most of the effort. Your affected arm should be partially relaxed.      3) Internal/External ROTATION   In the standing position, hold a wand/cane with both hands keeping your elbows bent. Move your arms and wand/cane to one side.  Your affected arm should be partially relaxed while your unaffected arm performs most of the effort.       4) Shoulder ABDUCTION   While holding a wand/cane palm face up on the injured side and palm face down on the uninjured side, slowly raise up your injured arm to the side.        5) Horizontal Abduction/Adduction     Straight arms holding cane at shoulder height, bring cane to right, center, left. Repeat starting to left.   Copyright  VHI. All rights reserved.

## 2019-12-27 ENCOUNTER — Ambulatory Visit (HOSPITAL_COMMUNITY): Payer: 59 | Admitting: Specialist

## 2019-12-27 ENCOUNTER — Encounter (HOSPITAL_COMMUNITY): Payer: Self-pay | Admitting: Specialist

## 2019-12-27 ENCOUNTER — Other Ambulatory Visit: Payer: Self-pay

## 2019-12-27 DIAGNOSIS — M25611 Stiffness of right shoulder, not elsewhere classified: Secondary | ICD-10-CM

## 2019-12-27 DIAGNOSIS — M25511 Pain in right shoulder: Secondary | ICD-10-CM

## 2019-12-27 DIAGNOSIS — R29898 Other symptoms and signs involving the musculoskeletal system: Secondary | ICD-10-CM

## 2019-12-27 NOTE — Therapy (Addendum)
[place], [place], [address] Phone: [phone]   Fax:  [phone]  Occupational Therapy Treatment  Patient Details  Name: [name] MRN: [id number] Date of Birth: 06/17/1980 Referring Provider (OT): Dr. Arther Abbott   Encounter Date: 12/27/2019  Time in 1030 Time out 1117    PROM  Overall PROM Comments Assessed supine, er/IR adducted   Right Shoulder Flexion 145 Degrees   32  Right Shoulder ABduction 95 Degrees   58  Right Shoulder Internal Rotation 90 Degrees   90  Right Shoulder External Rotation 52 Degrees   16     OT End of Session - 12/27/19 1149    Visit Number 10    Number of Visits 16    Date for OT Re-Evaluation 01/28/20    Authorization Type [employer]; $20 copay    Authorization Time Period Visit limit 75, 5 used    Authorization - Visit Number 15    Authorization - Number of Visits 75    Activity Tolerance Patient tolerated treatment well    Behavior During Therapy [facility] for tasks assessed/performed           Past Medical History:  Diagnosis Date  . Anxiety   . Depression   . Frequency of urination   . History of kidney stones   . Hypertension    followed by pcp  . Nephrolithiasis    CT 12-17-2018  left side nonobstructive stones, right ureter stone with obstruction  . Right ureteral stone   . SOB (shortness of breath)   . Urgency of urination     Past Surgical History:  Procedure Laterality Date  . APPENDECTOMY  age 47  . ARTHOSCOPIC ROTAOR CUFF REPAIR Right 10/31/2019   Procedure: ARTHROSCOPIC ROTATOR CUFF REPAIR;  Surgeon: Carole Civil, MD;  Location: [facility];  Service: Orthopedics;  Laterality: Right;  . BALLOON DILATION Left 01/17/2013   Procedure: BALLOON DILATION OF LEFT URETERAL STRICTURE;  Surgeon: Marissa Nestle, MD;  Location: [facility];  Service: Urology;  Laterality: Left;  . CYSTOSCOPY W/ URETERAL STENT PLACEMENT Left 01/17/2013   Procedure: CYSTOSCOPY  WITH LEFT RETROGRADE PYELOGRAM; LEFT URETERAL STENT PLACEMENT;  Surgeon: Marissa Nestle, MD;  Location: [facility];  Service: Urology;  Laterality: Left;  . CYSTOSCOPY WITH RETROGRADE PYELOGRAM, URETEROSCOPY AND STENT PLACEMENT Bilateral 12/23/2018   Procedure: CYSTOSCOPY WITH RETROGRADE PYELOGRAM, URETEROSCOPY AND STENT PLACEMENT;  Surgeon: Alexis Frock, MD;  Location: [facility];  Service: Urology;  Laterality: Bilateral;  90 MINS  . CYSTOSCOPY WITH RETROGRADE PYELOGRAM, URETEROSCOPY AND STENT PLACEMENT Bilateral 01/06/2019   Procedure: CYSTOSCOPY WITH RETROGRADE PYELOGRAM, URETEROSCOPY AND STENT PLACEMENT;  Surgeon: Alexis Frock, MD;  Location: [facility];  Service: Urology;  Laterality: Bilateral;  90 MINS  . CYSTOSCOPY/URETEROSCOPY/HOLMIUM LASER/STENT PLACEMENT  2008;  2010;  2011;  2012  . FLEXIBLE URETEROSCOPY Left 01/17/2013   Procedure: FLEXIBLE LEFT URETEROSCOPY; ATTEMPTED STONE BASKET EXTRACTION;  Surgeon: Marissa Nestle, MD;  Location: [facility];  Service: Urology;  Laterality: Left;  . HOLMIUM LASER APPLICATION Bilateral 44/0/3474   Procedure: HOLMIUM LASER APPLICATION;  Surgeon: Alexis Frock, MD;  Location: [facility];  Service: Urology;  Laterality: Bilateral;  . HOLMIUM LASER APPLICATION Bilateral 25/95/6387   Procedure: HOLMIUM LASER APPLICATION;  Surgeon: Alexis Frock, MD;  Location: [facility];  Service: Urology;  Laterality: Bilateral;    There were no vitals filed for this visit.   Subjective Assessment -  12/27/19 1147    Subjective  S:  I really dont use this arm too much.  I am used to using my left arm because the right one hurts.  I am doing my exercises.  I can tell its getting better I just wish it didnt hurt.    Currently in Pain? Yes    Pain Score 2     Pain Location Shoulder    Pain Orientation Right    Pain Descriptors / Indicators Aching    Pain Type Acute pain    Pain Onset More than a  month ago    Pain Frequency Intermittent    Aggravating Factors  movement, stretching    Pain Relieving Factors rest, medication    Effect of Pain on Daily Activities max on ADLs              [facility] OT Assessment - 12/27/19 0001      Assessment   Medical Diagnosis s/p right arthroscopic RCR      Precautions   Precautions Shoulder    Type of Shoulder Precautions 10/12-week 4: pendulums, scapular A/ROM, joint mobilizations. 10/19-12/7: P/ROM progressing to A/ROM as tolerated. Scapular strengtheing, avoid heavy lifting. 12/8+ progress to strengthening as tolerated      Observation/Other Assessments   Focus on Therapeutic Outcomes (FOTO)  53/100 (25/100)      Palpation   Palpation comment min mod fascial restrictions        PROM   Overall PROM Comments Assessed supine, er/IR adducted    Right Shoulder Flexion 145 Degrees   32   Right Shoulder ABduction 95 Degrees   58   Right Shoulder Internal Rotation 90 Degrees   90   Right Shoulder External Rotation 52 Degrees   16                   OT Treatments/Exercises (OP) - 12/27/19 0001      Shoulder Exercises: Supine   Protraction PROM;5 reps;AAROM;10 reps    Horizontal ABduction PROM;5 reps;AAROM;10 reps;Limitations    External Rotation PROM;5 reps;AAROM;10 reps    Internal Rotation PROM;5 reps;AAROM;10 reps    Flexion PROM;5 reps;AAROM;10 reps    ABduction PROM;5 reps;AAROM;10 reps      Shoulder Exercises: Seated   Elevation AROM;15 reps    Extension AROM;15 reps    Row AROM;15 reps      Shoulder Exercises: ROM/Strengthening   Thumb Tacks 1'     Prot/Ret//Elev/Dep 1'      Modalities   Modalities Electrical Stimulation      Moist Heat Therapy   Number Minutes Moist Heat 8 Minutes   shoulder   Moist Heat Location Shoulder      Electrical Stimulation   Electrical Stimulation Location right shoulder    Electrical Stimulation Action IFES    Electrical Stimulation Parameters 11.5 CV sweeping for 8 minutes      Electrical Stimulation Goals Pain      Manual Therapy   Manual Therapy Myofascial release    Manual therapy comments Manual therapy completed separately from therapeutic exercises     Myofascial Release myofascial release and manual techniques completed to right upper arm, trapezius, and scapular regions to decrease pain and fascial restrictions and increase joint ROM                   OT Education - 12/27/19 1148    Education Details discussed progress towards goals and encouraged patient to continue completing HEP    Person(s) Educated  Patient    Methods Explanation    Comprehension Verbalized understanding            OT Short Term Goals - 12/27/19 1152      OT SHORT TERM GOAL #1   Title Pt will be provided with and educated on HEP to improve mobility required for ADL completion.    Time 4    Period Weeks    Status On-going    Target Date 12/29/19      OT SHORT TERM GOAL #2   Title Pt will improve P/ROM of RUE to [facility] to improve ability to perform dressing tasks with minimal compensatory strategies.    Time 4    Period Weeks    Status On-going      OT SHORT TERM GOAL #3   Title Pt will improve strength in RUE to 3+/5 to improve ability to reach items at waist to shoulder height.    Time 4    Period Weeks    Status On-going             OT Long Term Goals - 12/27/19 1152      OT LONG TERM GOAL #1   Title Pt will decrease pain in RUE to 3/10 or less to improve ability to perform bathing tasks using RUE to reach and scrub.    Time 8    Period Weeks    Status On-going      OT LONG TERM GOAL #2   Title Pt will increase RUE A/ROM to [facility] to improve ability to perform functional reaching overhead and behind back.    Time 8    Period Weeks    Status On-going      OT LONG TERM GOAL #3   Title Pt will decrease fascial restrictions in RUE to minimal amounts or less to improve mobility required for overhead reaching.    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    Time 8    Period Weeks    Status On-going                 Plan - 12/27/19 1149    Clinical Impression Statement A:  patient has made significant improvement in supine P/ROM from initial evaluation.  Patient continues to guard and elevate shoulder during p/rom and aa/rom, p/rom > aa/rom with compensatory movement.  OT reiterated the importance of decreasing shoulder elevation when completing functional tasks as well as HEP.  Paitent is making progress towards OT goals and will benefit from continued skilled OT intervention.    Body Structure / Function / Physical Skills ADL;Endurance;UE functional use;Fascial restriction;Pain;ROM;IADL;Strength;Muscle spasms;Mobility    Plan P:  Attempt AA/ROM in standing, add ball circles, pvc slides.  Continue skilled OT intervention to decrease pain and improve functional use of RUE in order to return to work.           Patient will benefit from skilled therapeutic intervention in order to improve the following deficits and impairments:   Body Structure / Function / Physical Skills: ADL, Endurance, UE functional use, Fascial restriction, Pain, ROM, IADL, Strength, Muscle spasms, Mobility       Visit Diagnosis: Stiffness of right shoulder, not elsewhere classified  Acute pain of right shoulder  Other symptoms and signs involving  the musculoskeletal system    Problem List Patient Active Problem List   Diagnosis Date Noted  . S/P right rotator cuff repair 10/31/19 11/03/2019  . Traumatic complete tear of left rotator cuff   . Atrophy of left kidney 08/14/2013  . Bladder stone 06/13/2011  . 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 Personal history of noncompliance with medical treatment, presenting hazards to health 06/13/2011  . Retained foreign body 06/13/2011  . Urinary tract  infection 06/13/2011  . Urinary tract obstruction due to kidney stone 12/08/2010  . Hydronephrosis 11/25/2010  . Recurrent nephrolithiasis 11/25/2010  . Renal colic on left side 97/94/8016  . Pyelonephritis 11/07/2010  . Dehydration 11/07/2010  . Hypokalemia 11/07/2010  . Flank pain 11/04/2010  . UTI (urinary tract infection) 11/04/2010    Vangie Bicker, [place], OTR/L [phone]  12/27/2019, 11:56 AM  [place], [place], [address] Phone: [phone]   Fax:  [phone]  Name: [name] MRN: [id number] Date of Birth: 1980/03/29

## 2019-12-29 ENCOUNTER — Ambulatory Visit (HOSPITAL_COMMUNITY): Payer: 59 | Admitting: Occupational Therapy

## 2019-12-29 ENCOUNTER — Other Ambulatory Visit: Payer: Self-pay

## 2019-12-29 DIAGNOSIS — M25611 Stiffness of right shoulder, not elsewhere classified: Secondary | ICD-10-CM | POA: Diagnosis not present

## 2019-12-29 DIAGNOSIS — M25511 Pain in right shoulder: Secondary | ICD-10-CM

## 2019-12-29 DIAGNOSIS — R29898 Other symptoms and signs involving the musculoskeletal system: Secondary | ICD-10-CM

## 2019-12-29 NOTE — Therapy (Signed)
[facility] [name] [place], [place], [address] Phone: [phone]   Fax:  [phone]  Occupational Therapy Treatment  Patient Details  Name: [name] MRN: [id number] Date of Birth: 02-Mar-1980 Referring Provider (OT): Dr. Fuller Canada   Encounter Date: 12/29/2019   OT End of Session - 12/29/19 1310    Visit Number 11    Number of Visits 16    Date for OT Re-Evaluation 01/28/20    Authorization Type [employer]; $20 copay    Authorization Time Period Visit limit 75, 5 used    Authorization - Visit Number 16    Authorization - Number of Visits 75    OT Start Time 1118    OT Stop Time 1158    OT Time Calculation (min) 40 min    Activity Tolerance Patient tolerated treatment well    Behavior During Therapy [facility] for tasks assessed/performed           Past Medical History:  Diagnosis Date  . Anxiety   . Depression   . Frequency of urination   . History of kidney stones   . Hypertension    followed by pcp  . Nephrolithiasis    CT 12-17-2018  left side nonobstructive stones, right ureter stone with obstruction  . Right ureteral stone   . SOB (shortness of breath)   . Urgency of urination     Past Surgical History:  Procedure Laterality Date  . APPENDECTOMY  age 75  . ARTHOSCOPIC ROTAOR CUFF REPAIR Right 10/31/2019   Procedure: ARTHROSCOPIC ROTATOR CUFF REPAIR;  Surgeon: Vickki Hearing, MD;  Location: [facility];  Service: Orthopedics;  Laterality: Right;  . BALLOON DILATION Left 01/17/2013   Procedure: BALLOON DILATION OF LEFT URETERAL STRICTURE;  Surgeon: Ky Barban, MD;  Location: [facility];  Service: Urology;  Laterality: Left;  . CYSTOSCOPY W/ URETERAL STENT PLACEMENT Left 01/17/2013   Procedure: CYSTOSCOPY WITH LEFT RETROGRADE PYELOGRAM; LEFT URETERAL STENT PLACEMENT;  Surgeon: Ky Barban, MD;  Location: [facility];  Service: Urology;  Laterality: Left;  . CYSTOSCOPY WITH RETROGRADE PYELOGRAM,  URETEROSCOPY AND STENT PLACEMENT Bilateral 12/23/2018   Procedure: CYSTOSCOPY WITH RETROGRADE PYELOGRAM, URETEROSCOPY AND STENT PLACEMENT;  Surgeon: Sebastian Ache, MD;  Location: [facility];  Service: Urology;  Laterality: Bilateral;  90 MINS  . CYSTOSCOPY WITH RETROGRADE PYELOGRAM, URETEROSCOPY AND STENT PLACEMENT Bilateral 01/06/2019   Procedure: CYSTOSCOPY WITH RETROGRADE PYELOGRAM, URETEROSCOPY AND STENT PLACEMENT;  Surgeon: Sebastian Ache, MD;  Location: [facility];  Service: Urology;  Laterality: Bilateral;  90 MINS  . CYSTOSCOPY/URETEROSCOPY/HOLMIUM LASER/STENT PLACEMENT  2008;  2010;  2011;  2012  . FLEXIBLE URETEROSCOPY Left 01/17/2013   Procedure: FLEXIBLE LEFT URETEROSCOPY; ATTEMPTED STONE BASKET EXTRACTION;  Surgeon: Ky Barban, MD;  Location: [facility];  Service: Urology;  Laterality: Left;  . HOLMIUM LASER APPLICATION Bilateral 12/23/2018   Procedure: HOLMIUM LASER APPLICATION;  Surgeon: Sebastian Ache, MD;  Location: [facility];  Service: Urology;  Laterality: Bilateral;  . HOLMIUM LASER APPLICATION Bilateral 01/06/2019   Procedure: HOLMIUM LASER APPLICATION;  Surgeon: Sebastian Ache, MD;  Location: [facility];  Service: Urology;  Laterality: Bilateral;    There were no vitals filed for this visit.   Subjective Assessment - 12/29/19 1309    Subjective  S: I'm ok this morning.    Currently in Pain? Yes    Pain Score 2     Pain Location Shoulder    Pain  Orientation Right    Pain Descriptors / Indicators Aching    Pain Type Acute pain    Pain Radiating Towards None    Pain Onset More than a month ago    Pain Frequency Intermittent    Aggravating Factors  movement, stretching    Pain Relieving Factors rest, medication    Effect of Pain on Daily Activities mod to max on ADLs    Multiple Pain Sites No              [facility] OT Assessment - 12/29/19 1120      Assessment   Medical Diagnosis s/p right  arthroscopic RCR      Precautions   Precautions Shoulder    Type of Shoulder Precautions 10/12-week 4: pendulums, scapular A/ROM, joint mobilizations. 10/19-12/7: P/ROM progressing to A/ROM as tolerated. Scapular strengtheing, avoid heavy lifting. 12/8+ progress to strengthening as tolerated                    OT Treatments/Exercises (OP) - 12/29/19 1120      Exercises   Exercises Shoulder      Shoulder Exercises: Supine   Protraction PROM;5 reps;AROM;10 reps    Horizontal ABduction PROM;5 reps;AROM;10 reps    External Rotation PROM;5 reps;AAROM;10 reps    Internal Rotation PROM;5 reps;AAROM;10 reps    Flexion PROM;5 reps;AAROM;10 reps    ABduction PROM;5 reps;AAROM;10 reps      Shoulder Exercises: Standing   Protraction AAROM;10 reps    Horizontal ABduction AAROM;10 reps    External Rotation AAROM;10 reps    Internal Rotation AAROM;10 reps    Flexion AAROM;10 reps    ABduction AAROM;10 reps      Shoulder Exercises: Pulleys   Flexion 2 minutes    Scaption 2 minutes      Shoulder Exercises: Therapy Ball   Right/Left 5 reps   each direction     Shoulder Exercises: ROM/Strengthening   Wall Wash 1'    Other ROM/Strengthening Exercises pvc pipe slide, 10X flexion      Manual Therapy   Manual Therapy Myofascial release    Manual therapy comments Manual therapy completed separately from therapeutic exercises     Myofascial Release myofascial release and manual techniques completed to right upper arm, trapezius, and scapular regions to decrease pain and fascial restrictions and increase joint ROM                     OT Short Term Goals - 12/27/19 1152      OT SHORT TERM GOAL #1   Title Pt will be provided with and educated on HEP to improve mobility required for ADL completion.    Time 4    Period Weeks    Status On-going    Target Date 12/29/19      OT SHORT TERM GOAL #2   Title Pt will improve P/ROM of RUE to [facility] to improve ability to perform  dressing tasks with minimal compensatory strategies.    Time 4    Period Weeks    Status On-going      OT SHORT TERM GOAL #3   Title Pt will improve strength in RUE to 3+/5 to improve ability to reach items at waist to shoulder height.    Time 4    Period Weeks    Status On-going             OT Long Term Goals - 12/27/19 1152      OT LONG  TERM GOAL #1   Title Pt will decrease pain in RUE to 3/10 or less to improve ability to perform bathing tasks using RUE to reach and scrub.    Time 8    Period Weeks    Status On-going      OT LONG TERM GOAL #2   Title Pt will increase RUE A/ROM to [facility] to improve ability to perform functional reaching overhead and behind back.    Time 8    Period Weeks    Status On-going      OT LONG TERM GOAL #3   Title Pt will decrease fascial restrictions in RUE to minimal amounts or less to improve mobility required for overhead reaching.    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    Time 8    Period Weeks    Status On-going                 Plan - 12/29/19 1311    Clinical Impression Statement A: Continued with manual therapy and passive stretching at beginning of session. Pt with notable improvement in joint capsule end feel and ROM during passive stretching. Continued with AA/ROM and progressed to standing. Added pvc pipe slide, wall wash, and ball circles for IR. Verbal cuing for form and technique.    Body Structure / Function / Physical Skills ADL;Endurance;UE functional use;Fascial restriction;Pain;ROM;IADL;Strength;Muscle spasms;Mobility    Plan P: Add scapular theraband and proximal shoulder strengthening.    OT Home Exercise Plan eval: table slides, pendulums, wrist/forearm/elbow A/ROM 11/8: AA/ROM supine/seated           Patient will benefit from skilled therapeutic intervention in  order to improve the following deficits and impairments:   Body Structure / Function / Physical Skills: ADL, Endurance, UE functional use, Fascial restriction, Pain, ROM, IADL, Strength, Muscle spasms, Mobility       Visit Diagnosis: Stiffness of right shoulder, not elsewhere classified  Acute pain of right shoulder  Other symptoms and signs involving the musculoskeletal system    Problem List Patient Active Problem List   Diagnosis Date Noted  . S/P right rotator cuff repair 10/31/19 11/03/2019  . Traumatic complete tear of left rotator cuff   . Atrophy of left kidney 08/14/2013  . Bladder stone 06/13/2011  . 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 Personal history of noncompliance with medical treatment, presenting hazards to health 06/13/2011  . Retained foreign body 06/13/2011  . Urinary tract infection 06/13/2011  . Urinary tract obstruction due to kidney stone 12/08/2010  . Hydronephrosis 11/25/2010  . Recurrent nephrolithiasis 11/25/2010  . Renal colic on left side 11/07/2010  . Pyelonephritis 11/07/2010  . Dehydration 11/07/2010  . Hypokalemia 11/07/2010  . Flank pain 11/04/2010  . UTI (urinary tract infection) 11/04/2010   Ezra Sites, OTR/L  [phone] 12/29/2019, 1:15 PM  [place] [facility] [address] [place], [place], [address] Phone: [phone]   Fax:  [phone]  Name: LATARSHIA JERSEY MRN: [id number] Date of Birth: Oct 23, 1980

## 2020-01-02 ENCOUNTER — Encounter (HOSPITAL_COMMUNITY): Payer: Self-pay | Admitting: Occupational Therapy

## 2020-01-02 ENCOUNTER — Other Ambulatory Visit: Payer: Self-pay

## 2020-01-02 ENCOUNTER — Ambulatory Visit (HOSPITAL_COMMUNITY): Payer: 59 | Admitting: Occupational Therapy

## 2020-01-02 DIAGNOSIS — R29898 Other symptoms and signs involving the musculoskeletal system: Secondary | ICD-10-CM

## 2020-01-02 DIAGNOSIS — M25511 Pain in right shoulder: Secondary | ICD-10-CM

## 2020-01-02 DIAGNOSIS — M25611 Stiffness of right shoulder, not elsewhere classified: Secondary | ICD-10-CM | POA: Diagnosis not present

## 2020-01-02 NOTE — Therapy (Signed)
[facility] [address] [place], [place], [address] Phone: [phone]   Fax:  [phone]  Occupational Therapy Treatment  Patient Details  Name: [name] MRN: [id number] Date of Birth: [birth year] Referring Provider (OT): Dr. Fuller Canada   Encounter Date: 01/02/2020   OT End of Session - 01/02/20 1152    Visit Number 12    Number of Visits 16    Date for OT Re-Evaluation 01/28/20    Authorization Type [employer]; $20 copay    Authorization Time Period Visit limit 75, 5 used    Authorization - Visit Number 17    Authorization - Number of Visits 75    OT Start Time 1113    OT Stop Time 1152    OT Time Calculation (min) 39 min    Activity Tolerance Patient tolerated treatment well    Behavior During Therapy WFL for tasks assessed/performed           Past Medical History:  Diagnosis Date  . Anxiety   . Depression   . Frequency of urination   . History of kidney stones   . Hypertension    followed by pcp  . Nephrolithiasis    CT 12-17-2018  left side nonobstructive stones, right ureter stone with obstruction  . Right ureteral stone   . SOB (shortness of breath)   . Urgency of urination     Past Surgical History:  Procedure Laterality Date  . APPENDECTOMY  age 36  . ARTHOSCOPIC ROTAOR CUFF REPAIR Right 10/31/2019   Procedure: ARTHROSCOPIC ROTATOR CUFF REPAIR;  Surgeon: Vickki Hearing, MD;  Location: [facility];  Service: Orthopedics;  Laterality: Right;  . BALLOON DILATION Left 01/17/2013   Procedure: BALLOON DILATION OF LEFT URETERAL STRICTURE;  Surgeon: Ky Barban, MD;  Location: [facility];  Service: Urology;  Laterality: Left;  . CYSTOSCOPY W/ URETERAL STENT PLACEMENT Left 01/17/2013   Procedure: CYSTOSCOPY WITH LEFT RETROGRADE PYELOGRAM; LEFT URETERAL STENT PLACEMENT;  Surgeon: Ky Barban, MD;  Location: [facility];  Service: Urology;  Laterality: Left;  . CYSTOSCOPY WITH RETROGRADE PYELOGRAM,  URETEROSCOPY AND STENT PLACEMENT Bilateral 12/23/2018   Procedure: CYSTOSCOPY WITH RETROGRADE PYELOGRAM, URETEROSCOPY AND STENT PLACEMENT;  Surgeon: Sebastian Ache, MD;  Location: [facility];  Service: Urology;  Laterality: Bilateral;  90 MINS  . CYSTOSCOPY WITH RETROGRADE PYELOGRAM, URETEROSCOPY AND STENT PLACEMENT Bilateral 01/06/2019   Procedure: CYSTOSCOPY WITH RETROGRADE PYELOGRAM, URETEROSCOPY AND STENT PLACEMENT;  Surgeon: Sebastian Ache, MD;  Location: [facility];  Service: Urology;  Laterality: Bilateral;  90 MINS  . CYSTOSCOPY/URETEROSCOPY/HOLMIUM LASER/STENT PLACEMENT  2008;  2010;  2011;  2012  . FLEXIBLE URETEROSCOPY Left 01/17/2013   Procedure: FLEXIBLE LEFT URETEROSCOPY; ATTEMPTED STONE BASKET EXTRACTION;  Surgeon: Ky Barban, MD;  Location: [facility];  Service: Urology;  Laterality: Left;  . HOLMIUM LASER APPLICATION Bilateral 12/23/2018   Procedure: HOLMIUM LASER APPLICATION;  Surgeon: Sebastian Ache, MD;  Location: [facility];  Service: Urology;  Laterality: Bilateral;  . HOLMIUM LASER APPLICATION Bilateral 01/06/2019   Procedure: HOLMIUM LASER APPLICATION;  Surgeon: Sebastian Ache, MD;  Location: [facility];  Service: Urology;  Laterality: Bilateral;    There were no vitals filed for this visit.   Subjective Assessment - 01/02/20 1113    Subjective  S: It's the same.    Currently in Pain? Yes    Pain Score 2     Pain Location Shoulder    Pain Orientation  Right    Pain Descriptors / Indicators Aching;Sore    Pain Type Acute pain    Pain Radiating Towards None    Pain Onset More than a month ago    Pain Frequency Intermittent    Aggravating Factors  movement, stretching    Pain Relieving Factors rest, medication    Effect of Pain on Daily Activities mod effect on ADLs    Multiple Pain Sites No              [facility] OT Assessment - 01/02/20 1113      Assessment   Medical Diagnosis s/p right  arthroscopic RCR      Precautions   Precautions Shoulder    Type of Shoulder Precautions 10/12-week 4: pendulums, scapular A/ROM, joint mobilizations. 10/19-12/7: P/ROM progressing to A/ROM as tolerated. Scapular strengtheing, avoid heavy lifting. 12/8+ progress to strengthening as tolerated                    OT Treatments/Exercises (OP) - 01/02/20 1115      Exercises   Exercises Shoulder      Shoulder Exercises: Supine   Protraction PROM;5 reps;AROM;10 reps    Horizontal ABduction PROM;5 reps;AROM;10 reps    External Rotation PROM;5 reps;AROM;10 reps    Internal Rotation PROM;5 reps;AROM;10 reps    Flexion PROM;5 reps;AROM;10 reps    ABduction PROM;5 reps;AROM;10 reps      Shoulder Exercises: Standing   Protraction AAROM;12 reps    Horizontal ABduction AAROM;12 reps    External Rotation AAROM;12 reps    Internal Rotation AAROM;12 reps    Flexion AAROM;12 reps    ABduction AAROM;12 reps    Extension Theraband;10 reps    Theraband Level (Shoulder Extension) Level 2 (Red)    Row Theraband;10 reps    Theraband Level (Shoulder Row) Level 2 (Red)    Retraction Theraband;10 reps    Theraband Level (Shoulder Retraction) Level 2 (Red)      Shoulder Exercises: ROM/Strengthening   UBE (Upper Arm Bike) Level 1 2' forward 2' reverse, pace: 5.0    Proximal Shoulder Strengthening, Supine 10X each, no rest breaks    Proximal Shoulder Strengthening, Seated 10X each, no rest breaks      Manual Therapy   Manual Therapy Myofascial release    Manual therapy comments Manual therapy completed separately from therapeutic exercises     Myofascial Release myofascial release and manual techniques completed to right upper arm, trapezius, and scapular regions to decrease pain and fascial restrictions and increase joint ROM                     OT Short Term Goals - 12/27/19 1152      OT SHORT TERM GOAL #1   Title Pt will be provided with and educated on HEP to improve  mobility required for ADL completion.    Time 4    Period Weeks    Status On-going    Target Date 12/29/19      OT SHORT TERM GOAL #2   Title Pt will improve P/ROM of RUE to [facility] to improve ability to perform dressing tasks with minimal compensatory strategies.    Time 4    Period Weeks    Status On-going      OT SHORT TERM GOAL #3   Title Pt will improve strength in RUE to 3+/5 to improve ability to reach items at waist to shoulder height.    Time 4    Period  Weeks    Status On-going             OT Long Term Goals - 12/27/19 1152      OT LONG TERM GOAL #1   Title Pt will decrease pain in RUE to 3/10 or less to improve ability to perform bathing tasks using RUE to reach and scrub.    Time 8    Period Weeks    Status On-going      OT LONG TERM GOAL #2   Title Pt will increase RUE A/ROM to [facility] to improve ability to perform functional reaching overhead and behind back.    Time 8    Period Weeks    Status On-going      OT LONG TERM GOAL #3   Title Pt will decrease fascial restrictions in RUE to minimal amounts or less to improve mobility required for overhead reaching.    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    Time 8    Period Weeks    Status On-going                 Plan - 01/02/20 1129    Clinical Impression Statement A: Continued with manual techniques to address fascial restrictions and passive stretching. Progressed to A/ROM in supine and added proximal shoulder strengthening, continued with AA/ROM in standing. Added scapular theraband and UBE this session. Verbal cuing for form and technique.    Body Structure / Function / Physical Skills ADL;Endurance;UE functional use;Fascial restriction;Pain;ROM;IADL;Strength;Muscle spasms;Mobility    Plan P:    OT Home Exercise Plan eval: table slides, pendulums,  wrist/forearm/elbow A/ROM 11/8: AA/ROM supine/seated           Patient will benefit from skilled therapeutic intervention in order to improve the following deficits and impairments:   Body Structure / Function / Physical Skills: ADL, Endurance, UE functional use, Fascial restriction, Pain, ROM, IADL, Strength, Muscle spasms, Mobility       Visit Diagnosis: Acute pain of right shoulder  Stiffness of right shoulder, not elsewhere classified  Other symptoms and signs involving the musculoskeletal system    Problem List Patient Active Problem List   Diagnosis Date Noted  . S/P right rotator cuff repair 10/31/19 11/03/2019  . Traumatic complete tear of left rotator cuff   . Atrophy of left kidney 08/14/2013  . Bladder stone 06/13/2011  . 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 Personal history of noncompliance with medical treatment, presenting hazards to health 06/13/2011  . Retained foreign body 06/13/2011  . Urinary tract infection 06/13/2011  . Urinary tract obstruction due to kidney stone 12/08/2010  . Hydronephrosis 11/25/2010  . Recurrent nephrolithiasis 11/25/2010  . Renal colic on left side 11/07/2010  . Pyelonephritis 11/07/2010  . Dehydration 11/07/2010  . Hypokalemia 11/07/2010  . Flank pain 11/04/2010  . UTI (urinary tract infection) 11/04/2010   Ezra Sites, OTR/L  [phone] 01/02/2020, 11:53 AM  [facility] [address] [place], [place], [address] Phone: [phone]   Fax:  [phone]  Name: [name] MRN: [id number] Date of Birth: 18-May-1980

## 2020-01-04 ENCOUNTER — Other Ambulatory Visit: Payer: Self-pay

## 2020-01-04 ENCOUNTER — Ambulatory Visit (HOSPITAL_COMMUNITY): Payer: 59 | Admitting: Occupational Therapy

## 2020-01-04 DIAGNOSIS — M25611 Stiffness of right shoulder, not elsewhere classified: Secondary | ICD-10-CM

## 2020-01-04 DIAGNOSIS — M25511 Pain in right shoulder: Secondary | ICD-10-CM

## 2020-01-04 DIAGNOSIS — R29898 Other symptoms and signs involving the musculoskeletal system: Secondary | ICD-10-CM

## 2020-01-04 NOTE — Therapy (Signed)
[facility] [address] [place], [place], [address] Phone: [phone]   Fax:  [phone]  Occupational Therapy Treatment  Patient Details  Name: [name] MRN: [id number] Date of Birth: 12/02/1980 Referring Provider (OT): Dr. Fuller Canada   Encounter Date: 01/04/2020   OT End of Session - 01/04/20 1202    Visit Number 13    Number of Visits 16    Date for OT Re-Evaluation 01/28/20    Authorization Type [employer]; $20 copay    Authorization Time Period Visit limit 75, 5 used    Authorization - Visit Number 18    Authorization - Number of Visits 75    OT Start Time 1120    OT Stop Time 1158    OT Time Calculation (min) 38 min    Activity Tolerance Patient tolerated treatment well    Behavior During Therapy WFL for tasks assessed/performed           Past Medical History:  Diagnosis Date   Anxiety    Depression    Frequency of urination    History of kidney stones    Hypertension    followed by pcp   Nephrolithiasis    CT 12-17-2018  left side nonobstructive stones, right ureter stone with obstruction   Right ureteral stone    SOB (shortness of breath)    Urgency of urination     Past Surgical History:  Procedure Laterality Date   APPENDECTOMY  age 39   ARTHOSCOPIC 39 CUFF REPAIR Right 10/31/2019   Procedure: ARTHROSCOPIC ROTATOR CUFF REPAIR;  Surgeon: Vickki Hearing, MD;  Location: [facility];  Service: Orthopedics;  Laterality: Right;   BALLOON DILATION Left 01/17/2013   Procedure: BALLOON DILATION OF LEFT URETERAL STRICTURE;  Surgeon: Ky Barban, MD;  Location: [facility];  Service: Urology;  Laterality: Left;   CYSTOSCOPY W/ URETERAL STENT PLACEMENT Left 01/17/2013   Procedure: CYSTOSCOPY WITH LEFT RETROGRADE PYELOGRAM; LEFT URETERAL STENT PLACEMENT;  Surgeon: Ky Barban, MD;  Location: [facility];  Service: Urology;  Laterality: Left;   CYSTOSCOPY WITH RETROGRADE PYELOGRAM,  URETEROSCOPY AND STENT PLACEMENT Bilateral 12/23/2018   Procedure: CYSTOSCOPY WITH RETROGRADE PYELOGRAM, URETEROSCOPY AND STENT PLACEMENT;  Surgeon: Sebastian Ache, MD;  Location: [facility];  Service: Urology;  Laterality: Bilateral;  90 MINS   CYSTOSCOPY WITH RETROGRADE PYELOGRAM, URETEROSCOPY AND STENT PLACEMENT Bilateral 01/06/2019   Procedure: CYSTOSCOPY WITH RETROGRADE PYELOGRAM, URETEROSCOPY AND STENT PLACEMENT;  Surgeon: Sebastian Ache, MD;  Location: [facility];  Service: Urology;  Laterality: Bilateral;  90 MINS   CYSTOSCOPY/URETEROSCOPY/HOLMIUM LASER/STENT PLACEMENT  2008;  2010;  2011;  2012   FLEXIBLE URETEROSCOPY Left 01/17/2013   Procedure: FLEXIBLE LEFT URETEROSCOPY; ATTEMPTED STONE BASKET EXTRACTION;  Surgeon: Ky Barban, MD;  Location: [facility];  Service: Urology;  Laterality: Left;   HOLMIUM LASER APPLICATION Bilateral 12/23/2018   Procedure: HOLMIUM LASER APPLICATION;  Surgeon: Sebastian Ache, MD;  Location: [facility];  Service: Urology;  Laterality: Bilateral;   HOLMIUM LASER APPLICATION Bilateral 01/06/2019   Procedure: HOLMIUM LASER APPLICATION;  Surgeon: Sebastian Ache, MD;  Location: [facility];  Service: Urology;  Laterality: Bilateral;    There were no vitals filed for this visit.   Subjective Assessment - 01/04/20 1121    Subjective  S: It's hurting today, I think I slept on it.    Currently in Pain? Yes    Pain Score 4     Pain Location  Shoulder    Pain Orientation Right    Pain Descriptors / Indicators Aching;Sore    Pain Type Acute pain    Pain Radiating Towards None    Pain Onset More than a month ago    Pain Frequency Intermittent    Aggravating Factors  sleeping on it    Pain Relieving Factors rest, medication    Effect of Pain on Daily Activities mod effect on ADLs    Multiple Pain Sites No              [facility] OT Assessment - 01/04/20 1132      Assessment   Medical  Diagnosis s/p right arthroscopic RCR      Precautions   Precautions Shoulder    Type of Shoulder Precautions 10/12-week 4: pendulums, scapular A/ROM, joint mobilizations. 10/19-12/7: P/ROM progressing to A/ROM as tolerated. Scapular strengtheing, avoid heavy lifting. 12/8+ progress to strengthening as tolerated                    OT Treatments/Exercises (OP) - 01/04/20 1132      Exercises   Exercises Shoulder      Shoulder Exercises: Supine   Protraction PROM;5 reps;AROM;10 reps    Horizontal ABduction PROM;5 reps;AROM;10 reps    External Rotation PROM;5 reps;AROM;10 reps    Internal Rotation PROM;5 reps;AROM;10 reps    Flexion PROM;5 reps;AROM;10 reps    ABduction PROM;5 reps;AROM;10 reps      Shoulder Exercises: Pulleys   Flexion 1 minute    Scaption 1 minute      Shoulder Exercises: ROM/Strengthening   Proximal Shoulder Strengthening, Supine 10X each, no rest breaks      Modalities   Modalities Electrical Stimulation      Moist Heat Therapy   Moist Heat Location Shoulder      Electrical Stimulation   Electrical Stimulation Location right shoulder    Electrical Stimulation Action IFES    Electrical Stimulation Parameters 6.4 CV    Electrical Stimulation Goals Pain      Manual Therapy   Manual Therapy Myofascial release    Manual therapy comments Manual therapy completed separately from therapeutic exercises     Myofascial Release myofascial release and manual techniques completed to right upper arm, trapezius, and scapular regions to decrease pain and fascial restrictions and increase joint ROM                     OT Short Term Goals - 12/27/19 1152      OT SHORT TERM GOAL #1   Title Pt will be provided with and educated on HEP to improve mobility required for ADL completion.    Time 4    Period Weeks    Status On-going    Target Date 12/29/19      OT SHORT TERM GOAL #2   Title Pt will improve P/ROM of RUE to [facility] to improve ability to  perform dressing tasks with minimal compensatory strategies.    Time 4    Period Weeks    Status On-going      OT SHORT TERM GOAL #3   Title Pt will improve strength in RUE to 3+/5 to improve ability to reach items at waist to shoulder height.    Time 4    Period Weeks    Status On-going             OT Long Term Goals - 12/27/19 1152      OT LONG TERM GOAL #  1   Title Pt will decrease pain in RUE to 3/10 or less to improve ability to perform bathing tasks using RUE to reach and scrub.    Time 8    Period Weeks    Status On-going      OT LONG TERM GOAL #2   Title Pt will increase RUE A/ROM to [facility] to improve ability to perform functional reaching overhead and behind back.    Time 8    Period Weeks    Status On-going      OT LONG TERM GOAL #3   Title Pt will decrease fascial restrictions in RUE to minimal amounts or less to improve mobility required for overhead reaching.    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    Time 8    Period Weeks    Status On-going                 Plan - 01/04/20 1145    Clinical Impression Statement A: Pt reporting increased pain today, possibly from sleeping on her RUE. Continued with manual techniques to address fascial restrictions, minimal restrictions palpated. Pt with decreased tolerance to passive stretching and A/ROM. Pt was able to complete A/ROM supine, verbal cuing for form. Pt then requesting ES for pain management.    Body Structure / Function / Physical Skills ADL;Endurance;UE functional use;Fascial restriction;Pain;ROM;IADL;Strength;Muscle spasms;Mobility    Plan P: Resume missed exercises, add shoulder stretching and update HEP    OT Home Exercise Plan eval: table slides, pendulums, wrist/forearm/elbow A/ROM 11/8: AA/ROM supine/seated    Consulted and Agree with Plan of Care Patient            Patient will benefit from skilled therapeutic intervention in order to improve the following deficits and impairments:   Body Structure / Function / Physical Skills: ADL, Endurance, UE functional use, Fascial restriction, Pain, ROM, IADL, Strength, Muscle spasms, Mobility       Visit Diagnosis: Acute pain of right shoulder  Stiffness of right shoulder, not elsewhere classified  Other symptoms and signs involving the musculoskeletal system    Problem List Patient Active Problem List   Diagnosis Date Noted   S/P right rotator cuff repair 10/31/19 11/03/2019   Traumatic complete tear of left rotator cuff    Atrophy of left kidney 08/14/2013   Bladder stone 06/13/2011   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 [place], [place], [address] Phone: [phone]   Fax:  [phone]  Name: [name] MRN: [id number] Date of Birth: 09-17-1980

## 2020-01-08 ENCOUNTER — Ambulatory Visit (HOSPITAL_COMMUNITY): Payer: 59

## 2020-01-09 ENCOUNTER — Encounter (HOSPITAL_COMMUNITY): Payer: Self-pay | Admitting: Occupational Therapy

## 2020-01-09 ENCOUNTER — Ambulatory Visit (HOSPITAL_COMMUNITY): Payer: 59 | Admitting: Occupational Therapy

## 2020-01-09 ENCOUNTER — Other Ambulatory Visit: Payer: Self-pay

## 2020-01-09 DIAGNOSIS — M25511 Pain in right shoulder: Secondary | ICD-10-CM

## 2020-01-09 DIAGNOSIS — R29898 Other symptoms and signs involving the musculoskeletal system: Secondary | ICD-10-CM

## 2020-01-09 DIAGNOSIS — M25611 Stiffness of right shoulder, not elsewhere classified: Secondary | ICD-10-CM

## 2020-01-09 NOTE — Patient Instructions (Signed)

## 2020-01-09 NOTE — Therapy (Signed)
[facility] [address] [place], [place], [address] Phone: [phone]   Fax:  [phone]  Occupational Therapy Treatment  Patient Details  Name: [name] MRN: [id number] Date of Birth: Apr 21, 1980 Referring Provider (OT): Dr. Fuller Canada   Encounter Date: 01/09/2020   OT End of Session - 01/09/20 1153    Visit Number 14    Number of Visits 16    Date for OT Re-Evaluation 01/28/20    Authorization Type [employer]; $20 copay    Authorization Time Period Visit limit 75, 5 used    Authorization - Visit Number 19    Authorization - Number of Visits 75    OT Start Time 1117    OT Stop Time 1157    OT Time Calculation (min) 40 min    Activity Tolerance Patient tolerated treatment well    Behavior During Therapy [facility] for tasks assessed/performed           Past Medical History:  Diagnosis Date  . Anxiety   . Depression   . Frequency of urination   . History of kidney stones   . Hypertension    followed by pcp  . Nephrolithiasis    CT 12-17-2018  left side nonobstructive stones, right ureter stone with obstruction  . Right ureteral stone   . SOB (shortness of breath)   . Urgency of urination     Past Surgical History:  Procedure Laterality Date  . APPENDECTOMY  age 75  . ARTHOSCOPIC ROTAOR CUFF REPAIR Right 10/31/2019   Procedure: ARTHROSCOPIC ROTATOR CUFF REPAIR;  Surgeon: Vickki Hearing, MD;  Location: [facility];  Service: Orthopedics;  Laterality: Right;  . BALLOON DILATION Left 01/17/2013   Procedure: BALLOON DILATION OF LEFT URETERAL STRICTURE;  Surgeon: Ky Barban, MD;  Location: [facility];  Service: Urology;  Laterality: Left;  . CYSTOSCOPY W/ URETERAL STENT PLACEMENT Left 01/17/2013   Procedure: CYSTOSCOPY WITH LEFT RETROGRADE PYELOGRAM; LEFT URETERAL STENT PLACEMENT;  Surgeon: Ky Barban, MD;  Location: [facility];  Service: Urology;  Laterality: Left;  . CYSTOSCOPY WITH RETROGRADE PYELOGRAM,  URETEROSCOPY AND STENT PLACEMENT Bilateral 12/23/2018   Procedure: CYSTOSCOPY WITH RETROGRADE PYELOGRAM, URETEROSCOPY AND STENT PLACEMENT;  Surgeon: Sebastian Ache, MD;  Location: [facility];  Service: Urology;  Laterality: Bilateral;  90 MINS  . CYSTOSCOPY WITH RETROGRADE PYELOGRAM, URETEROSCOPY AND STENT PLACEMENT Bilateral 01/06/2019   Procedure: CYSTOSCOPY WITH RETROGRADE PYELOGRAM, URETEROSCOPY AND STENT PLACEMENT;  Surgeon: Sebastian Ache, MD;  Location: [facility];  Service: Urology;  Laterality: Bilateral;  90 MINS  . CYSTOSCOPY/URETEROSCOPY/HOLMIUM LASER/STENT PLACEMENT  2008;  2010;  2011;  2012  . FLEXIBLE URETEROSCOPY Left 01/17/2013   Procedure: FLEXIBLE LEFT URETEROSCOPY; ATTEMPTED STONE BASKET EXTRACTION;  Surgeon: Ky Barban, MD;  Location: [facility];  Service: Urology;  Laterality: Left;  . HOLMIUM LASER APPLICATION Bilateral 12/23/2018   Procedure: HOLMIUM LASER APPLICATION;  Surgeon: Sebastian Ache, MD;  Location: [facility];  Service: Urology;  Laterality: Bilateral;  . HOLMIUM LASER APPLICATION Bilateral 01/06/2019   Procedure: HOLMIUM LASER APPLICATION;  Surgeon: Sebastian Ache, MD;  Location: [facility];  Service: Urology;  Laterality: Bilateral;    There were no vitals filed for this visit.   Subjective Assessment - 01/09/20 1117    Subjective  S: In the shower I can hold it up under the water.    Currently in Pain? Yes    Pain Score 2  Pain Location Shoulder    Pain Orientation Right    Pain Descriptors / Indicators Aching;Sore    Pain Type Acute pain    Pain Radiating Towards None    Pain Onset More than a month ago    Pain Frequency Intermittent    Aggravating Factors  sleeping on it    Pain Relieving Factors rest, medicaiton    Effect of Pain on Daily Activities min/mod effect on ADLs              [facility] OT Assessment - 01/09/20 1116      Assessment   Medical Diagnosis s/p right  arthroscopic RCR      Precautions   Precautions Shoulder    Type of Shoulder Precautions 10/12-week 4: pendulums, scapular A/ROM, joint mobilizations. 10/19-12/7: P/ROM progressing to A/ROM as tolerated. Scapular strengtheing, avoid heavy lifting. 12/8+ progress to strengthening as tolerated                    OT Treatments/Exercises (OP) - 01/09/20 1119      Exercises   Exercises Shoulder      Shoulder Exercises: Supine   Protraction PROM;5 reps;AROM;12 reps    Horizontal ABduction PROM;5 reps;AROM;12 reps    External Rotation PROM;5 reps;AROM;12 reps    Internal Rotation PROM;5 reps;AROM;12 reps    Flexion PROM;5 reps;AROM;12 reps    ABduction PROM;5 reps;AROM;12 reps      Shoulder Exercises: Standing   Protraction AROM;10 reps    Horizontal ABduction AROM;10 reps    External Rotation AROM;10 reps    Internal Rotation AROM;10 reps    Flexion AROM;10 reps    ABduction AROM;10 reps    Extension Theraband;10 reps    Theraband Level (Shoulder Extension) Level 2 (Red)    Row Theraband;10 reps    Theraband Level (Shoulder Row) Level 2 (Red)    Retraction Theraband;10 reps    Theraband Level (Shoulder Retraction) Level 2 (Red)      Shoulder Exercises: ROM/Strengthening   UBE (Upper Arm Bike) Level 1 2' forward 2' reverse, pace: 5.0    Proximal Shoulder Strengthening, Supine 10X each, no rest breaks      Shoulder Exercises: Stretch   Cross Chest Stretch 3 reps;10 seconds    Internal Rotation Stretch 3 reps   10" horizontal towel   External Rotation Stretch 3 reps;10 seconds    Wall Stretch - Flexion 3 reps;10 seconds    Wall Stretch - ABduction 3 reps;10 seconds    Other Shoulder Stretches doorway stretch, 3x10"      Manual Therapy   Manual Therapy Myofascial release    Manual therapy comments Manual therapy completed separately from therapeutic exercises     Myofascial Release myofascial release and manual techniques completed to right upper arm, trapezius, and  scapular regions to decrease pain and fascial restrictions and increase joint ROM                   OT Education - 01/09/20 1138    Education Details shoulder stretches    Person(s) Educated Patient    Methods Explanation;Demonstration;Handout    Comprehension Verbalized understanding;Returned demonstration            OT Short Term Goals - 12/27/19 1152      OT SHORT TERM GOAL #1   Title Pt will be provided with and educated on HEP to improve mobility required for ADL completion.    Time 4    Period Weeks    Status  On-going    Target Date 12/29/19      OT SHORT TERM GOAL #2   Title Pt will improve P/ROM of RUE to [facility] to improve ability to perform dressing tasks with minimal compensatory strategies.    Time 4    Period Weeks    Status On-going      OT SHORT TERM GOAL #3   Title Pt will improve strength in RUE to 3+/5 to improve ability to reach items at waist to shoulder height.    Time 4    Period Weeks    Status On-going             OT Long Term Goals - 12/27/19 1152      OT LONG TERM GOAL #1   Title Pt will decrease pain in RUE to 3/10 or less to improve ability to perform bathing tasks using RUE to reach and scrub.    Time 8    Period Weeks    Status On-going      OT LONG TERM GOAL #2   Title Pt will increase RUE A/ROM to [facility] to improve ability to perform functional reaching overhead and behind back.    Time 8    Period Weeks    Status On-going      OT LONG TERM GOAL #3   Title Pt will decrease fascial restrictions in RUE to minimal amounts or less to improve mobility required for overhead reaching.    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    Time 8    Period Weeks    Status On-going                 Plan - 01/09/20 1153    Clinical Impression Statement A: Continued with manual techniques  to address fascial restrictions. Pt with improved tolerance to passive stretching, reports improved ability to hold arm up when showering now. Progressed to all A/ROM today, added shoulder stretches and updated HEP for shoulder stretching. Continued with scapular theraband as well. Verbal cuing for form and technique.    Body Structure / Function / Physical Skills ADL;Endurance;UE functional use;Fascial restriction;Pain;ROM;IADL;Strength;Muscle spasms;Mobility    Plan P: Follow up on HEP, continue with A/ROM, add proximal shoulder strengthening on door; update HEP for A/ROM    OT Home Exercise Plan eval: table slides, pendulums, wrist/forearm/elbow A/ROM 11/8: AA/ROM supine/seated; 11/23: shoulder stretches    Consulted and Agree with Plan of Care Patient           Patient will benefit from skilled therapeutic intervention in order to improve the following deficits and impairments:   Body Structure / Function / Physical Skills: ADL, Endurance, UE functional use, Fascial restriction, Pain, ROM, IADL, Strength, Muscle spasms, Mobility       Visit Diagnosis: Stiffness of right shoulder, not elsewhere classified  Other symptoms and signs involving the musculoskeletal system  Acute pain of right shoulder    Problem List Patient Active Problem List   Diagnosis Date Noted  . S/P right rotator cuff repair 10/31/19 11/03/2019  . Traumatic complete tear of left rotator cuff   . Atrophy of left kidney 08/14/2013  . Bladder stone 06/13/2011  . 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 Personal history of noncompliance with medical treatment, presenting hazards to health  06/13/2011  . Retained foreign body 06/13/2011  . Urinary tract infection 06/13/2011  . Urinary tract obstruction due to kidney stone 12/08/2010  . Hydronephrosis 11/25/2010  . Recurrent nephrolithiasis 11/25/2010  . Renal colic on left side 11/07/2010  . Pyelonephritis 11/07/2010  . Dehydration 11/07/2010  .  Hypokalemia 11/07/2010  . Flank pain 11/04/2010  . UTI (urinary tract infection) 11/04/2010   Ezra Sites, OTR/L  [phone] 01/09/2020, 11:59 AM  [facility] [address] [place], [place], [address] Phone: [phone]   Fax:  [phone]  Name: [name] MRN: [id number] Date of Birth: September 09, 1980

## 2020-01-16 ENCOUNTER — Ambulatory Visit (HOSPITAL_COMMUNITY): Payer: 59

## 2020-01-16 ENCOUNTER — Other Ambulatory Visit: Payer: Self-pay

## 2020-01-16 ENCOUNTER — Encounter (HOSPITAL_COMMUNITY): Payer: Self-pay

## 2020-01-16 DIAGNOSIS — M25611 Stiffness of right shoulder, not elsewhere classified: Secondary | ICD-10-CM | POA: Diagnosis not present

## 2020-01-16 DIAGNOSIS — R29898 Other symptoms and signs involving the musculoskeletal system: Secondary | ICD-10-CM

## 2020-01-16 NOTE — Therapy (Signed)
[facility] [address] [place], [place], [address] Phone: [phone]   Fax:  [phone]  Occupational Therapy Treatment  Patient Details  Name: [name] MRN: [id number] Date of Birth: [birth year] Referring Provider (OT): Dr. Fuller CanadaStanley Harrison   Encounter Date: 01/16/2020   OT End of Session - 01/16/20 1119    Visit Number 15    Number of Visits 16    Date for OT Re-Evaluation 01/28/20    Authorization Type [employer]; $20 copay    Authorization Time Period Visit limit 75, 5 used    Authorization - Visit Number 20    Authorization - Number of Visits 75    OT Start Time 1031    OT Stop Time 1110    OT Time Calculation (min) 39 min    Activity Tolerance Patient tolerated treatment well    Behavior During Therapy [facility] for tasks assessed/performed           Past Medical History:  Diagnosis Date  . Anxiety   . Depression   . Frequency of urination   . History of kidney stones   . Hypertension    followed by pcp  . Nephrolithiasis    CT 12-17-2018  left side nonobstructive stones, right ureter stone with obstruction  . Right ureteral stone   . SOB (shortness of breath)   . Urgency of urination     Past Surgical History:  Procedure Laterality Date  . APPENDECTOMY  age 39  . ARTHOSCOPIC ROTAOR CUFF REPAIR Right 10/31/2019   Procedure: ARTHROSCOPIC ROTATOR CUFF REPAIR;  Surgeon: Vickki HearingHarrison, Stanley E, MD;  Location: [facility];  Service: Orthopedics;  Laterality: Right;  . BALLOON DILATION Left 01/17/2013   Procedure: BALLOON DILATION OF LEFT URETERAL STRICTURE;  Surgeon: Ky BarbanMohammad I Javaid, MD;  Location: [facility];  Service: Urology;  Laterality: Left;  . CYSTOSCOPY W/ URETERAL STENT PLACEMENT Left 01/17/2013   Procedure: CYSTOSCOPY WITH LEFT RETROGRADE PYELOGRAM; LEFT URETERAL STENT PLACEMENT;  Surgeon: Ky BarbanMohammad I Javaid, MD;  Location: [facility];  Service: Urology;  Laterality: Left;  . CYSTOSCOPY WITH RETROGRADE PYELOGRAM,  URETEROSCOPY AND STENT PLACEMENT Bilateral 12/23/2018   Procedure: CYSTOSCOPY WITH RETROGRADE PYELOGRAM, URETEROSCOPY AND STENT PLACEMENT;  Surgeon: Sebastian AcheManny, Theodore, MD;  [facility];  Service: Urology;  Laterality: Bilateral;  90 MINS  . CYSTOSCOPY WITH RETROGRADE PYELOGRAM, URETEROSCOPY AND STENT PLACEMENT Bilateral 01/06/2019   Procedure: CYSTOSCOPY WITH RETROGRADE PYELOGRAM, URETEROSCOPY AND STENT PLACEMENT;  Surgeon: Sebastian AcheManny, Theodore, MD;  [facility];  Service: Urology;  Laterality: Bilateral;  90 MINS  . CYSTOSCOPY/URETEROSCOPY/HOLMIUM LASER/STENT PLACEMENT  2008;  2010;  2011;  2012  . FLEXIBLE URETEROSCOPY Left 01/17/2013   Procedure: FLEXIBLE LEFT URETEROSCOPY; ATTEMPTED STONE BASKET EXTRACTION;  Surgeon: Ky BarbanMohammad I Javaid, MD;  Location: [facility];  Service: Urology;  Laterality: Left;  . HOLMIUM LASER APPLICATION Bilateral 12/23/2018   Procedure: HOLMIUM LASER APPLICATION;  Surgeon: Sebastian AcheManny, Theodore, MD;  [facility];  Service: Urology;  Laterality: Bilateral;  . HOLMIUM LASER APPLICATION Bilateral 01/06/2019   Procedure: HOLMIUM LASER APPLICATION;  Surgeon: Sebastian AcheManny, Theodore, MD;  [facility] [place];  Service: Urology;  Laterality: Bilateral;    There were no vitals filed for this visit.   Subjective Assessment - 01/16/20 1046    Subjective  S: Today is a good day. Sometimes I have bad days though.    Currently in Pain? Yes    Pain Score 2  Pain Location Shoulder    Pain Orientation Right    Pain Descriptors / Indicators Aching;Sore    Pain Type Acute pain    Pain Radiating Towards None    Pain Onset More than a month ago    Pain Frequency Intermittent    Aggravating Factors  sleeping on it    Pain Relieving Factors prescription pain medication    Effect of Pain on Daily Activities No effect    Multiple Pain Sites No              [facility] OT Assessment - 01/16/20 1048      Assessment    Medical Diagnosis s/p right arthroscopic RCR    Next MD Visit 01/24/20      Precautions   Precautions Shoulder    Type of Shoulder Precautions 10/12-week 4: pendulums, scapular A/ROM, joint mobilizations. 10/19-12/7: P/ROM progressing to A/ROM as tolerated. Scapular strengtheing, avoid heavy lifting. 12/8+ progress to strengthening as tolerated                    OT Treatments/Exercises (OP) - 01/16/20 1048      Exercises   Exercises Shoulder      Shoulder Exercises: Supine   Protraction PROM;5 reps;AROM;12 reps    Horizontal ABduction PROM;5 reps;AROM;12 reps    External Rotation PROM;5 reps;AROM;12 reps    Internal Rotation PROM;5 reps;AROM;12 reps    Flexion PROM;5 reps;AROM;12 reps    ABduction PROM;5 reps;AROM;12 reps      Shoulder Exercises: ROM/Strengthening   UBE (Upper Arm Bike) Level 1 2' forward 2' reverse, pace: 5.0   pace: 4.0-5.0   Proximal Shoulder Strengthening, Supine 12X each, no rest breaks    Other ROM/Strengthening Exercises Proximal shoulder strengthening; washcloth at door; flexion; 1'      Manual Therapy   Manual Therapy Myofascial release    Manual therapy comments Manual therapy completed separately from therapeutic exercises     Myofascial Release myofascial release and manual techniques completed to right upper arm, trapezius, and scapular regions to decrease pain and fascial restrictions and increase joint ROM                   OT Education - 01/16/20 1055    Education Details continous education throughout session on form and technique. Stop all previous HEP exercises. Continue with shoulder stretches. Adding A/ROM shoulder exercises this date. Recommended using ice after therapy session for 5-10 minutes for pain control.    Person(s) Educated Patient    Methods Explanation;Demonstration;Handout;Verbal cues    Comprehension Verbalized understanding;Returned demonstration            OT Short Term Goals - 12/27/19 1152       OT SHORT TERM GOAL #1   Title Pt will be provided with and educated on HEP to improve mobility required for ADL completion.    Time 4    Period Weeks    Status On-going    Target Date 12/29/19      OT SHORT TERM GOAL #2   Title Pt will improve P/ROM of RUE to [facility] to improve ability to perform dressing tasks with minimal compensatory strategies.    Time 4    Period Weeks    Status On-going      OT SHORT TERM GOAL #3   Title Pt will improve strength in RUE to 3+/5 to improve ability to reach items at waist to shoulder height.    Time 4    Period Weeks  Status On-going             OT Long Term Goals - 12/27/19 1152      OT LONG TERM GOAL #1   Title Pt will decrease pain in RUE to 3/10 or less to improve ability to perform bathing tasks using RUE to reach and scrub.    Time 8    Period Weeks    Status On-going      OT LONG TERM GOAL #2   Title Pt will increase RUE A/ROM to [facility] to improve ability to perform functional reaching overhead and behind back.    Time 8    Period Weeks    Status On-going      OT LONG TERM GOAL #3   Title Pt will decrease fascial restrictions in RUE to minimal amounts or less to improve mobility required for overhead reaching.    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    Time 8    Period Weeks    Status On-going                 Plan - 01/16/20 1120    Clinical Impression Statement A: Required VC  throughout session for form and technique in order to active appropriate muscles during exercises. Muscle fatigue kicks in quickly and patient loses her form quickly and her effort decreases. Voiced pain with supine proximal shoulder strengthening once she completed activity properly. Education provided on reason she didn't feel it before versus when adjusting her technique. Better shoulder control was  noted when making a fist while completing supine A/ROM abduction.    Body Structure / Function / Physical Skills ADL;Endurance;UE functional use;Fascial restriction;Pain;ROM;IADL;Strength;Muscle spasms;Mobility    Plan P: Continue working on form and technique during exercises. Complete functional reaching task to work on shoulder stability.    Consulted and Agree with Plan of Care Patient           Patient will benefit from skilled therapeutic intervention in order to improve the following deficits and impairments:   Body Structure / Function / Physical Skills: ADL, Endurance, UE functional use, Fascial restriction, Pain, ROM, IADL, Strength, Muscle spasms, Mobility       Visit Diagnosis: Stiffness of right shoulder, not elsewhere classified  Other symptoms and signs involving the musculoskeletal system    Problem List Patient Active Problem List   Diagnosis Date Noted  . S/P right rotator cuff repair 10/31/19 11/03/2019  . Traumatic complete tear of left rotator cuff   . Atrophy of left kidney 08/14/2013  . Bladder stone 06/13/2011  . 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 Personal history of noncompliance with medical treatment, presenting hazards to health 06/13/2011  . Retained foreign body 06/13/2011  . Urinary tract infection 06/13/2011  . Urinary tract obstruction due to kidney stone 12/08/2010  . Hydronephrosis 11/25/2010  . Recurrent nephrolithiasis 11/25/2010  . Renal colic on left side 11/07/2010  . Pyelonephritis 11/07/2010  . Dehydration 11/07/2010  . Hypokalemia 11/07/2010  . Flank pain 11/04/2010  . UTI (urinary tract infection) 11/04/2010   Limmie Patricia, OTR/L,CBIS  [phone]  01/16/2020, 11:39 AM  [facility] [address] [place], [place], [address] Phone: [phone]   Fax:  [phone]  Name: [name] MRN:  [id number] Date of Birth: October 14, 1980

## 2020-01-16 NOTE — Patient Instructions (Signed)
Repeat all exercises 10-15 times, 1-2 times per day.  1) Shoulder Protraction    Begin with elbows by your side, slowly "punch" straight out in front of you.      2) Shoulder Flexion  Standing:         Begin with arms at your side with thumbs pointed up, slowly raise both arms up and forward towards overhead.          3) Horizontal abduction/adduction   Standing:           Begin with arms straight out in front of you, bring out to the side in at "T" shape. Keep arms straight entire time.        4) Internal & External Rotation   Standing:     Stand with elbows at the side and elbows bent 90 degrees. Move your forearms away from your body, then bring back inward toward the body.     5) Shoulder Abduction   Standing:       Begin with your arms flat on the table next to your side. Slowly move your arms out to the side so that they go overhead, in a jumping jack or snow angel movement.        

## 2020-01-18 ENCOUNTER — Other Ambulatory Visit: Payer: Self-pay

## 2020-01-18 ENCOUNTER — Encounter (HOSPITAL_COMMUNITY): Payer: Self-pay | Admitting: Occupational Therapy

## 2020-01-18 ENCOUNTER — Ambulatory Visit (HOSPITAL_COMMUNITY): Payer: 59 | Attending: Orthopedic Surgery | Admitting: Occupational Therapy

## 2020-01-18 DIAGNOSIS — M25611 Stiffness of right shoulder, not elsewhere classified: Secondary | ICD-10-CM | POA: Diagnosis not present

## 2020-01-18 DIAGNOSIS — M25511 Pain in right shoulder: Secondary | ICD-10-CM | POA: Diagnosis present

## 2020-01-18 DIAGNOSIS — R29898 Other symptoms and signs involving the musculoskeletal system: Secondary | ICD-10-CM | POA: Insufficient documentation

## 2020-01-18 NOTE — Therapy (Signed)
[facility] [address] [place], [place], [address] Phone: [phone]   Fax:  [phone]  Occupational Therapy Treatment  Patient Details  Name: [name] MRN: [id number] Date of Birth: 1980/04/30 Referring Provider (OT): Dr. Fuller Canada   Encounter Date: 01/18/2020   OT End of Session - 01/18/20 1204    Visit Number 16    Number of Visits 16    Date for OT Re-Evaluation 01/28/20    Authorization Type [employer]; $20 copay    Authorization Time Period Visit limit 75, 5 used    Authorization - Visit Number 21    Authorization - Number of Visits 75    OT Start Time 1120    OT Stop Time 1200    OT Time Calculation (min) 40 min    Activity Tolerance Patient tolerated treatment well    Behavior During Therapy [facility] for tasks assessed/performed           Past Medical History:  Diagnosis Date  . Anxiety   . Depression   . Frequency of urination   . History of kidney stones   . Hypertension    followed by pcp  . Nephrolithiasis    CT 12-17-2018  left side nonobstructive stones, right ureter stone with obstruction  . Right ureteral stone   . SOB (shortness of breath)   . Urgency of urination     Past Surgical History:  Procedure Laterality Date  . APPENDECTOMY  age 88  . ARTHOSCOPIC ROTAOR CUFF REPAIR Right 10/31/2019   Procedure: ARTHROSCOPIC ROTATOR CUFF REPAIR;  Surgeon: Vickki Hearing, MD;  Location: [facility];  Service: Orthopedics;  Laterality: Right;  . BALLOON DILATION Left 01/17/2013   Procedure: BALLOON DILATION OF LEFT URETERAL STRICTURE;  Surgeon: Ky Barban, MD;  Location: [facility];  Service: Urology;  Laterality: Left;  . CYSTOSCOPY W/ URETERAL STENT PLACEMENT Left 01/17/2013   Procedure: CYSTOSCOPY WITH LEFT RETROGRADE PYELOGRAM; LEFT URETERAL STENT PLACEMENT;  Surgeon: Ky Barban, MD;  Location: [facility];  Service: Urology;  Laterality: Left;  . CYSTOSCOPY WITH RETROGRADE PYELOGRAM, URETEROSCOPY  AND STENT PLACEMENT Bilateral 12/23/2018   Procedure: CYSTOSCOPY WITH RETROGRADE PYELOGRAM, URETEROSCOPY AND STENT PLACEMENT;  Surgeon: Sebastian Ache, MD;  Location: [facility];  Service: Urology;  Laterality: Bilateral;  90 MINS  . CYSTOSCOPY WITH RETROGRADE PYELOGRAM, URETEROSCOPY AND STENT PLACEMENT Bilateral 01/06/2019   Procedure: CYSTOSCOPY WITH RETROGRADE PYELOGRAM, URETEROSCOPY AND STENT PLACEMENT;  Surgeon: Sebastian Ache, MD;  Location: [facility];  Service: Urology;  Laterality: Bilateral;  90 MINS  . CYSTOSCOPY/URETEROSCOPY/HOLMIUM LASER/STENT PLACEMENT  2008;  2010;  2011;  2012  . FLEXIBLE URETEROSCOPY Left 01/17/2013   Procedure: FLEXIBLE LEFT URETEROSCOPY; ATTEMPTED STONE BASKET EXTRACTION;  Surgeon: Ky Barban, MD;  [facility]: [facility];  Service: Urology;  Laterality: Left;  . HOLMIUM LASER APPLICATION Bilateral 12/23/2018   Procedure: HOLMIUM LASER APPLICATION;  Surgeon: Sebastian Ache, MD;  Location: [facility];  Service: Urology;  Laterality: Bilateral;  . HOLMIUM LASER APPLICATION Bilateral 01/06/2019   Procedure: HOLMIUM LASER APPLICATION;  Surgeon: Sebastian Ache, MD;  Location: [facility];  Service: Urology;  Laterality: Bilateral;    There were no vitals filed for this visit.   Subjective Assessment - 01/18/20 1122    Subjective  S: I've just been using it.    Currently in Pain? No/denies              [facility] OT Assessment -  01/18/20 1121      Assessment   Medical Diagnosis s/p right arthroscopic RCR      Precautions   Precautions Shoulder    Type of Shoulder Precautions 10/12-week 4: pendulums, scapular A/ROM, joint mobilizations. 10/19-12/7: P/ROM progressing to A/ROM as tolerated. Scapular strengtheing, avoid heavy lifting. 12/8+ progress to strengthening as tolerated                    OT Treatments/Exercises (OP) - 01/18/20 1122      Exercises   Exercises Shoulder       Shoulder Exercises: Supine   Protraction PROM;5 reps;AROM;12 reps    Horizontal ABduction PROM;5 reps;AROM;12 reps    External Rotation PROM;5 reps;AROM;12 reps    Internal Rotation PROM;5 reps;AROM;12 reps    Flexion PROM;5 reps;AROM;12 reps    ABduction PROM;5 reps;AROM;12 reps      Shoulder Exercises: Standing   Protraction AROM;10 reps    Horizontal ABduction AROM;10 reps    External Rotation AROM;10 reps    Internal Rotation AROM;10 reps    Flexion AROM;10 reps    ABduction AROM;10 reps    Extension Theraband;10 reps    Theraband Level (Shoulder Extension) Level 2 (Red)    Row Theraband;10 reps    Theraband Level (Shoulder Row) Level 2 (Red)    Retraction Theraband;10 reps    Theraband Level (Shoulder Retraction) Level 2 (Red)      Shoulder Exercises: ROM/Strengthening   Over Head Lace 2'    X to V Arms 10X     Proximal Shoulder Strengthening, Supine 12X each, no rest breaks    Other ROM/Strengthening Exercises Proximal shoulder strengthening; washcloth at door; flexion and abduction 1' each      Manual Therapy   Manual Therapy Myofascial release    Manual therapy comments Manual therapy completed separately from therapeutic exercises     Myofascial Release myofascial release and manual techniques completed to right upper arm, trapezius, and scapular regions to decrease pain and fascial restrictions and increase joint ROM                   OT Education - 01/18/20 1140    Education Details red theraband strengthening    Person(s) Educated Patient    Methods Explanation;Demonstration;Handout    Comprehension Verbalized understanding;Returned demonstration            OT Short Term Goals - 12/27/19 1152      OT SHORT TERM GOAL #1   Title Pt will be provided with and educated on HEP to improve mobility required for ADL completion.    Time 4    Period Weeks    Status On-going    Target Date 12/29/19      OT SHORT TERM GOAL #2   Title Pt will improve P/ROM  of RUE to [facility] to improve ability to perform dressing tasks with minimal compensatory strategies.    Time 4    Period Weeks    Status On-going      OT SHORT TERM GOAL #3   Title Pt will improve strength in RUE to 3+/5 to improve ability to reach items at waist to shoulder height.    Time 4    Period Weeks    Status On-going             OT Long Term Goals - 12/27/19 1152      OT LONG TERM GOAL #1   Title Pt will decrease pain in RUE to 3/10  or less to improve ability to perform bathing tasks using RUE to reach and scrub.    Time 8    Period Weeks    Status On-going      OT LONG TERM GOAL #2   Title Pt will increase RUE A/ROM to [facility] to improve ability to perform functional reaching overhead and behind back.    Time 8    Period Weeks    Status On-going      OT LONG TERM GOAL #3   Title Pt will decrease fascial restrictions in RUE to minimal amounts or less to improve mobility required for overhead reaching.    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    Time 8    Period Weeks    Status On-going                 Plan - 01/18/20 1140    Clinical Impression Statement A: Continued with manual techniques and passive stretching. Continued with A/ROM, pt demonstrating ROM WFL in supine and standing, cuing for focusing on form and tightening arm to keep it straight versus letting it bend and flop down. Continued with scapular theraband and added to HEP. Added x to v arms and overhead lacing. Verbal cuing for form and technique.    Body Structure / Function / Physical Skills ADL;Endurance;UE functional use;Fascial restriction;Pain;ROM;IADL;Strength;Muscle spasms;Mobility    Plan P: Reassessment, FOTO, recertification    OT Home Exercise Plan eval: table slides, pendulums, wrist/forearm/elbow A/ROM 11/8: AA/ROM supine/seated; 11/23:  shoulder stretches; 11/30 A/ROM; 12/2: Scapular theraband    Consulted and Agree with Plan of Care Patient           Patient will benefit from skilled therapeutic intervention in order to improve the following deficits and impairments:   Body Structure / Function / Physical Skills: ADL, Endurance, UE functional use, Fascial restriction, Pain, ROM, IADL, Strength, Muscle spasms, Mobility       Visit Diagnosis: Stiffness of right shoulder, not elsewhere classified  Other symptoms and signs involving the musculoskeletal system  Acute pain of right shoulder    Problem List Patient Active Problem List   Diagnosis Date Noted  . S/P right rotator cuff repair 10/31/19 11/03/2019  . Traumatic complete tear of left rotator cuff   . Atrophy of left kidney 08/14/2013  . Bladder stone 06/13/2011  . 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 Personal history of noncompliance with medical treatment, presenting hazards to health 06/13/2011  . Retained foreign body 06/13/2011  . Urinary tract infection 06/13/2011  . Urinary tract obstruction due to kidney stone 12/08/2010  . Hydronephrosis 11/25/2010  . Recurrent nephrolithiasis 11/25/2010  . Renal colic on left side 11/07/2010  . Pyelonephritis 11/07/2010  . Dehydration 11/07/2010  . Hypokalemia 11/07/2010  . Flank pain 11/04/2010  . UTI (urinary tract infection) 11/04/2010   [name], OTR/L  [phone] 01/18/2020, 12:04 PM  [facility] [address] [place], [place], [address] Phone: [phone]   Fax:  [phone]  Name: [name] MRN: [id number] Date of Birth: [birth year]

## 2020-01-18 NOTE — Patient Instructions (Signed)

## 2020-01-22 ENCOUNTER — Ambulatory Visit (HOSPITAL_COMMUNITY): Payer: 59

## 2020-01-24 ENCOUNTER — Ambulatory Visit (INDEPENDENT_AMBULATORY_CARE_PROVIDER_SITE_OTHER): Payer: 59 | Admitting: Orthopedic Surgery

## 2020-01-24 ENCOUNTER — Encounter: Payer: Self-pay | Admitting: Orthopedic Surgery

## 2020-01-24 ENCOUNTER — Ambulatory Visit (HOSPITAL_COMMUNITY): Payer: 59 | Admitting: Occupational Therapy

## 2020-01-24 ENCOUNTER — Encounter (HOSPITAL_COMMUNITY): Payer: Self-pay | Admitting: Occupational Therapy

## 2020-01-24 ENCOUNTER — Other Ambulatory Visit: Payer: Self-pay

## 2020-01-24 DIAGNOSIS — M25611 Stiffness of right shoulder, not elsewhere classified: Secondary | ICD-10-CM | POA: Diagnosis not present

## 2020-01-24 DIAGNOSIS — M25511 Pain in right shoulder: Secondary | ICD-10-CM

## 2020-01-24 DIAGNOSIS — R29898 Other symptoms and signs involving the musculoskeletal system: Secondary | ICD-10-CM

## 2020-01-24 DIAGNOSIS — Z9889 Other specified postprocedural states: Secondary | ICD-10-CM

## 2020-01-24 NOTE — Patient Instructions (Signed)

## 2020-01-24 NOTE — Therapy (Signed)
[facility] [place], [place], [address] Phone: [phone]   Fax:  [phone]  Occupational Therapy Reassessment,Treatment, Discharge Summary  Patient Details  Name: [name] MRN: [id number] Date of Birth: February 10, 1981 Referring Provider (OT): Dr. Arther Abbott   Encounter Date: 01/24/2020   OT End of Session - 01/24/20 1100    Visit Number 17    Number of Visits 17    Date for OT Re-Evaluation 01/28/20    Authorization Type [employer]; $20 copay    Authorization Time Period Visit limit 75, 5 used    Authorization - Visit Number 22    Authorization - Number of Visits 75    OT Start Time 5537    OT Stop Time 1055    OT Time Calculation (min) 23 min    Activity Tolerance Patient tolerated treatment well    Behavior During Therapy WFL for tasks assessed/performed           Past Medical History:  Diagnosis Date  . Anxiety   . Depression   . Frequency of urination   . History of kidney stones   . Hypertension    followed by pcp  . Nephrolithiasis    CT 12-17-2018  left side nonobstructive stones, right ureter stone with obstruction  . Right ureteral stone   . SOB (shortness of breath)   . Urgency of urination     Past Surgical History:  Procedure Laterality Date  . APPENDECTOMY  age 10  . ARTHOSCOPIC ROTAOR CUFF REPAIR Right 10/31/2019   Procedure: ARTHROSCOPIC ROTATOR CUFF REPAIR;  Surgeon: Carole Civil, MD;  Location: [facility];  Service: Orthopedics;  Laterality: Right;  . BALLOON DILATION Left 01/17/2013   Procedure: BALLOON DILATION OF LEFT URETERAL STRICTURE;  Surgeon: Marissa Nestle, MD;  Location: [facility];  Service: Urology;  Laterality: Left;  . CYSTOSCOPY W/ URETERAL STENT PLACEMENT Left 01/17/2013   Procedure: CYSTOSCOPY WITH LEFT RETROGRADE PYELOGRAM; LEFT URETERAL STENT PLACEMENT;  Surgeon: Marissa Nestle, MD;  Location: [facility];  Service: Urology;  Laterality: Left;  . CYSTOSCOPY WITH  RETROGRADE PYELOGRAM, URETEROSCOPY AND STENT PLACEMENT Bilateral 12/23/2018   Procedure: CYSTOSCOPY WITH RETROGRADE PYELOGRAM, URETEROSCOPY AND STENT PLACEMENT;  Surgeon: Alexis Frock, MD;  Location: [facility];  Service: Urology;  Laterality: Bilateral;  90 MINS  . CYSTOSCOPY WITH RETROGRADE PYELOGRAM, URETEROSCOPY AND STENT PLACEMENT Bilateral 01/06/2019   Procedure: CYSTOSCOPY WITH RETROGRADE PYELOGRAM, URETEROSCOPY AND STENT PLACEMENT;  Surgeon: Alexis Frock, MD;  Location: [facility];  Service: Urology;  Laterality: Bilateral;  90 MINS  . CYSTOSCOPY/URETEROSCOPY/HOLMIUM LASER/STENT PLACEMENT  2008;  2010;  2011;  2012  . FLEXIBLE URETEROSCOPY Left 01/17/2013   Procedure: FLEXIBLE LEFT URETEROSCOPY; ATTEMPTED STONE BASKET EXTRACTION;  Surgeon: Marissa Nestle, MD;  Location: [facility];  Service: Urology;  Laterality: Left;  . HOLMIUM LASER APPLICATION Bilateral 48/03/7076   Procedure: HOLMIUM LASER APPLICATION;  Surgeon: Alexis Frock, MD;  Location: [facility];  Service: Urology;  Laterality: Bilateral;  . HOLMIUM LASER APPLICATION Bilateral 67/54/4920   Procedure: HOLMIUM LASER APPLICATION;  Surgeon: Alexis Frock, MD;  Location: [facility];  Service: Urology;  Laterality: Bilateral;    There were no vitals filed for this visit.   Subjective Assessment - 01/24/20 1030    Subjective  S: Today's my last day!    Currently in Pain? No/denies              [facility] OT  Assessment - 01/24/20 1030      Assessment   Medical Diagnosis s/p right arthroscopic RCR      Precautions   Precautions Shoulder    Type of Shoulder Precautions strengthening as tolerated      Observation/Other Assessments   Focus on Therapeutic Outcomes (FOTO)  66/100   53/100 previous     Palpation   Palpation comment trace fascial restrictions      AROM   Overall AROM Comments Assessed seated, er/IR adducted    AROM Assessment Site Shoulder     Right/Left Shoulder Right    Right Shoulder Flexion 142 Degrees   115 previous   Right Shoulder ABduction 180 Degrees   102 previous   Right Shoulder Internal Rotation 90 Degrees   same as previous   Right Shoulder External Rotation 65 Degrees   60 previous     PROM   Overall PROM Comments Assessed supine, er/IR adducted    PROM Assessment Site Shoulder    Right/Left Shoulder Right    Right Shoulder Flexion 151 Degrees   145 previous   Right Shoulder ABduction 180 Degrees   95 previous   Right Shoulder Internal Rotation 90 Degrees   same as previous   Right Shoulder External Rotation 65 Degrees   52 previous     Strength   Overall Strength Comments Assessed seated, er/IR adducted    Strength Assessment Site Shoulder    Right/Left Shoulder Right    Right Shoulder Flexion 4+/5   3-/5 previous   Right Shoulder ABduction 4+/5   3-/5 previous   Right Shoulder Internal Rotation 5/5   3/5 previous   Right Shoulder External Rotation 4+/5   3/5 previous                   OT Treatments/Exercises (OP) - 01/24/20 1044      Exercises   Exercises Shoulder      Shoulder Exercises: Supine   Protraction PROM;5 reps    Horizontal ABduction PROM;5 reps    External Rotation PROM;5 reps    Internal Rotation PROM;5 reps    Flexion PROM;5 reps    ABduction PROM;5 reps      Shoulder Exercises: Standing   Protraction Theraband;10 reps    Theraband Level (Shoulder Protraction) Level 2 (Red)    Horizontal ABduction Theraband;10 reps    Theraband Level (Shoulder Horizontal ABduction) Level 2 (Red)    External Rotation Theraband;10 reps    Theraband Level (Shoulder External Rotation) Level 2 (Red)    Internal Rotation Theraband;10 reps    Theraband Level (Shoulder Internal Rotation) Level 2 (Red)    Flexion Theraband;10 reps    Theraband Level (Shoulder Flexion) Level 2 (Red)    ABduction Theraband;10 reps    Theraband Level (Shoulder ABduction) Level 2 (Red)                   OT Education - 01/24/20 1045    Education Details theraband strengthening            OT Short Term Goals - 01/24/20 1100      OT SHORT TERM GOAL #1   Title Pt will be provided with and educated on HEP to improve mobility required for ADL completion.    Time 4    Period Weeks    Status Achieved    Target Date 12/29/19      OT SHORT TERM GOAL #2   Title Pt will improve P/ROM of RUE to [facility]  to improve ability to perform dressing tasks with minimal compensatory strategies.    Time 4    Period Weeks    Status Achieved      OT SHORT TERM GOAL #3   Title Pt will improve strength in RUE to 3+/5 to improve ability to reach items at waist to shoulder height.    Time 4    Period Weeks    Status Achieved             OT Long Term Goals - 01/24/20 1101      OT LONG TERM GOAL #1   Title Pt will decrease pain in RUE to 3/10 or less to improve ability to perform bathing tasks using RUE to reach and scrub.    Time 8    Period Weeks    Status Achieved      OT LONG TERM GOAL #2   Title Pt will increase RUE A/ROM to [facility] to improve ability to perform functional reaching overhead and behind back.    Time 8    Period Weeks    Status Achieved      OT LONG TERM GOAL #3   Title Pt will decrease fascial restrictions in RUE to minimal amounts or less to improve mobility required for overhead reaching.    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    Time 8    Period Weeks    Status Achieved                 Plan - 01/24/20 1101    Clinical Impression Statement A: Reassessment completed this session, pt has met all goals and has made good progress throughout course of therapy. Pt reports she is using the RUE normally when there is no pain, when having pain continues to have some functional limitations. Pt saw MD this morning  and has been released to return to work on Jan 3rd with a 25# lifting restrictions, instructions to complete theraband HEP at home. Pt is agreeable to discharge with HEP today.    Body Structure / Function / Physical Skills ADL;Endurance;UE functional use;Fascial restriction;Pain;ROM;IADL;Strength;Muscle spasms;Mobility    Plan P: Discharge pt    OT Home Exercise Plan eval: table slides, pendulums, wrist/forearm/elbow A/ROM 11/8: AA/ROM supine/seated; 11/23: shoulder stretches; 11/30 A/ROM; 12/2: Scapular theraband; 12/8: red theraband strengthening    Consulted and Agree with Plan of Care Patient           Patient will benefit from skilled therapeutic intervention in order to improve the following deficits and impairments:   Body Structure / Function / Physical Skills: ADL, Endurance, UE functional use, Fascial restriction, Pain, ROM, IADL, Strength, Muscle spasms, Mobility       Visit Diagnosis: Stiffness of right shoulder, not elsewhere classified  Other symptoms and signs involving the musculoskeletal system  Acute pain of right shoulder    Problem List Patient Active Problem List   Diagnosis Date Noted  . S/P right rotator cuff repair 10/31/19 11/03/2019  . Traumatic complete tear of left rotator cuff   . Atrophy of left kidney 08/14/2013  . Bladder stone 06/13/2011  . 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 Personal history of noncompliance with medical treatment, presenting hazards to health 06/13/2011  . Retained foreign body 06/13/2011  .  Urinary tract infection 06/13/2011  . Urinary tract obstruction due to kidney stone 12/08/2010  . Hydronephrosis 11/25/2010  . Recurrent nephrolithiasis 11/25/2010  . Renal colic on left side 15/72/6203  . Pyelonephritis 11/07/2010  . Dehydration 11/07/2010  . Hypokalemia 11/07/2010  . Flank pain 11/04/2010  . UTI (urinary tract infection) 11/04/2010   Guadelupe Sabin, OTR/L  [phone] 01/24/2020, 11:03 AM  [facility] [address] [place], [place], [address] Phone: [phone]   Fax:  [phone]  Name: [name] MRN: [id number] Date of Birth: 1980-10-20   OCCUPATIONAL THERAPY DISCHARGE SUMMARY  Visits from Start of Care: 17  Current functional level related to goals / functional outcomes: See above. Pt met all goals, has minimal pain with use, is going back to work on Jan 3rd.    Remaining deficits: Occasional pain with sustained use, decreased activity tolerance   Education / Equipment: HEP for red theraband strengthening Plan: Patient agrees to discharge.  Patient goals were met. Patient is being discharged due to meeting the stated rehab goals.  ?????

## 2020-01-24 NOTE — Progress Notes (Signed)
GLOBAL PERIOD POST OP APPT   The global period: 01/29/20  INJURY OR POST OP DAY: 32   Encounter Diagnosis  Name Primary?  . S/P right rotator cuff repair 10/31/19 Yes    Chief Complaint  Patient presents with  . Post-op Follow-up    10/31/19 improving right RCR     HPI: 16 of 16 PT visits last on 12/2   PRE-OPERATIVE DIAGNOSIS:  torn right rotator cuff   POST-OPERATIVE DIAGNOSIS:  torn right rotator cuff   PROCEDURE:  Procedure(s): ARTHROSCOPIC ROTATOR CUFF REPAIR (Right)    Implant: Swivel lock and suture tape Arthrex speed fix configuration   Findings normal glenohumeral joint with mild biceps inflammation   Subacromial bursitis   Anterior supraspinatus tendon full-thickness tear minimal retraction with excellent mobility  Pain level:  2/10  Physical Exam  Full range of motion has been restored.  Patient will need to work on strengthening of the rotator cuff with Thera-Band exercises at home  Patient returned to work on January 3 with restrictions with 25 pound lifting restriction  Plan: released

## 2020-01-24 NOTE — Patient Instructions (Signed)
Return to work January 3 with restriction no lifting more than 25 pounds  Continue therapy at home for 6 weeks

## 2020-04-25 ENCOUNTER — Other Ambulatory Visit (HOSPITAL_COMMUNITY): Payer: Self-pay | Admitting: Physician Assistant

## 2020-04-25 DIAGNOSIS — Z1231 Encounter for screening mammogram for malignant neoplasm of breast: Secondary | ICD-10-CM

## 2020-04-29 ENCOUNTER — Telehealth: Payer: Self-pay | Admitting: Orthopedic Surgery

## 2020-04-29 NOTE — Telephone Encounter (Signed)
[employer] called to follow up on request for patient's records. Per chart, release of information request was completed by our records provider [employer] on 03/26/20; spoke with Angie at this company, ph [phone].

## 2020-06-04 ENCOUNTER — Other Ambulatory Visit: Payer: Self-pay | Admitting: Orthopedic Surgery

## 2020-07-08 ENCOUNTER — Other Ambulatory Visit: Payer: Self-pay

## 2020-07-08 ENCOUNTER — Ambulatory Visit (HOSPITAL_COMMUNITY)
Admission: RE | Admit: 2020-07-08 | Discharge: 2020-07-08 | Disposition: A | Discharge status: Home / Self Care | Payer: 59 | Source: Ambulatory Visit | Attending: Physician Assistant | Admitting: Physician Assistant

## 2020-07-08 DIAGNOSIS — Z1231 Encounter for screening mammogram for malignant neoplasm of breast: Secondary | ICD-10-CM | POA: Diagnosis present

## 2020-07-16 ENCOUNTER — Other Ambulatory Visit (HOSPITAL_COMMUNITY): Payer: Self-pay | Admitting: Physician Assistant

## 2020-07-16 DIAGNOSIS — R928 Other abnormal and inconclusive findings on diagnostic imaging of breast: Secondary | ICD-10-CM

## 2020-07-23 ENCOUNTER — Ambulatory Visit (HOSPITAL_COMMUNITY)
Admission: RE | Admit: 2020-07-23 | Discharge: 2020-07-23 | Disposition: A | Discharge status: Home / Self Care | Payer: 59 | Source: Ambulatory Visit | Attending: Physician Assistant | Admitting: Physician Assistant

## 2020-07-23 DIAGNOSIS — R928 Other abnormal and inconclusive findings on diagnostic imaging of breast: Secondary | ICD-10-CM

## 2020-11-01 ENCOUNTER — Other Ambulatory Visit: Payer: Self-pay | Admitting: Urology

## 2020-11-11 ENCOUNTER — Encounter (HOSPITAL_BASED_OUTPATIENT_CLINIC_OR_DEPARTMENT_OTHER): Payer: Self-pay | Admitting: Urology

## 2020-11-11 ENCOUNTER — Other Ambulatory Visit: Payer: Self-pay

## 2020-11-11 NOTE — Progress Notes (Addendum)
Spoke w/ via phone for pre-op interview---pt Lab needs dos----   I stat ekg urine poct            Lab results-----none- COVID test -----patient states asymptomatic no test needed Arrive at -------1030 am 11-15-2020 NPO after MN NO Solid Food.  Clear liquids from MN until---930 am Med rec completed Medications to take morning of surgery -----clonidine prn Diabetic medication -----n/a Patient instructed no nail polish to be worn day of surgery Patient instructed to bring photo id and insurance card day of surgery Patient aware to have Driver (ride ) / caregiver   cousin debra slade will stay  for 24 hours after surgery  Patient Special Instructions -----none Pre-Op special Istructions -----none Patient verbalized understanding of instructions that were given at this phone interview. Patient denies shortness of breath, chest pain, fever, cough at this phone interview.

## 2020-11-15 ENCOUNTER — Ambulatory Visit (HOSPITAL_BASED_OUTPATIENT_CLINIC_OR_DEPARTMENT_OTHER): Payer: 59 | Admitting: Anesthesiology

## 2020-11-15 ENCOUNTER — Encounter (HOSPITAL_BASED_OUTPATIENT_CLINIC_OR_DEPARTMENT_OTHER): Payer: Self-pay | Admitting: Urology

## 2020-11-15 ENCOUNTER — Ambulatory Visit (HOSPITAL_BASED_OUTPATIENT_CLINIC_OR_DEPARTMENT_OTHER)
Admission: RE | Admit: 2020-11-15 | Discharge: 2020-11-15 | Disposition: A | Discharge status: Home / Self Care | Payer: 59 | Attending: Urology | Admitting: Urology

## 2020-11-15 ENCOUNTER — Other Ambulatory Visit: Payer: Self-pay | Admitting: Urology

## 2020-11-15 ENCOUNTER — Encounter (HOSPITAL_BASED_OUTPATIENT_CLINIC_OR_DEPARTMENT_OTHER)
Admission: RE | Disposition: A | Discharge status: Home / Self Care | Payer: Self-pay | Source: Home / Self Care | Attending: Urology

## 2020-11-15 DIAGNOSIS — N261 Atrophy of kidney (terminal): Secondary | ICD-10-CM | POA: Diagnosis not present

## 2020-11-15 DIAGNOSIS — Z79899 Other long term (current) drug therapy: Secondary | ICD-10-CM | POA: Insufficient documentation

## 2020-11-15 DIAGNOSIS — Z87440 Personal history of urinary (tract) infections: Secondary | ICD-10-CM | POA: Diagnosis not present

## 2020-11-15 DIAGNOSIS — Z87442 Personal history of urinary calculi: Secondary | ICD-10-CM | POA: Insufficient documentation

## 2020-11-15 DIAGNOSIS — F17290 Nicotine dependence, other tobacco product, uncomplicated: Secondary | ICD-10-CM | POA: Diagnosis not present

## 2020-11-15 DIAGNOSIS — Z888 Allergy status to other drugs, medicaments and biological substances status: Secondary | ICD-10-CM | POA: Insufficient documentation

## 2020-11-15 DIAGNOSIS — Z91048 Other nonmedicinal substance allergy status: Secondary | ICD-10-CM | POA: Diagnosis not present

## 2020-11-15 DIAGNOSIS — N1330 Unspecified hydronephrosis: Secondary | ICD-10-CM | POA: Diagnosis present

## 2020-11-15 DIAGNOSIS — N2889 Other specified disorders of kidney and ureter: Secondary | ICD-10-CM | POA: Diagnosis not present

## 2020-11-15 HISTORY — DX: Headache, unspecified: R51.9

## 2020-11-15 HISTORY — PX: CYSTOSCOPY WITH RETROGRADE PYELOGRAM, URETEROSCOPY AND STENT PLACEMENT: SHX5789

## 2020-11-15 LAB — POCT I-STAT, CHEM 8
BUN: 20 mg/dL (ref 6–20)
Calcium, Ion: 1.24 mmol/L (ref 1.15–1.40)
Chloride: 104 mmol/L (ref 98–111)
Creatinine, Ser: 1.6 mg/dL — ABNORMAL HIGH (ref 0.44–1.00)
Glucose, Bld: 90 mg/dL (ref 70–99)
HCT: 45 % (ref 36.0–46.0)
Hemoglobin: 15.3 g/dL — ABNORMAL HIGH (ref 12.0–15.0)
Potassium: 3.6 mmol/L (ref 3.5–5.1)
Sodium: 141 mmol/L (ref 135–145)
TCO2: 21 mmol/L — ABNORMAL LOW (ref 22–32)

## 2020-11-15 LAB — POCT PREGNANCY, URINE: Preg Test, Ur: NEGATIVE

## 2020-11-15 SURGERY — CYSTOURETEROSCOPY, WITH RETROGRADE PYELOGRAM AND STENT INSERTION
Anesthesia: General | Site: Renal | Laterality: Bilateral

## 2020-11-15 MED ORDER — IOHEXOL 300 MG/ML  SOLN
INTRAMUSCULAR | Status: DC | PRN
  Administered 2020-11-15: 20 mL via URETHRAL

## 2020-11-15 MED ORDER — SENNOSIDES-DOCUSATE SODIUM 8.6-50 MG PO TABS: 1.0000 | ORAL_TABLET | Freq: Two times a day (BID) | ORAL | 0 refills | Status: DC

## 2020-11-15 MED ORDER — LACTATED RINGERS IV SOLN: INTRAVENOUS | Status: DC

## 2020-11-15 MED ORDER — KETOROLAC TROMETHAMINE 30 MG/ML IJ SOLN: 30.0000 mg | Freq: Once | INTRAMUSCULAR | Status: DC | PRN

## 2020-11-15 MED ORDER — DEXAMETHASONE SODIUM PHOSPHATE 10 MG/ML IJ SOLN
INTRAMUSCULAR | Status: DC | PRN
  Administered 2020-11-15: 10 mg via INTRAVENOUS

## 2020-11-15 MED ORDER — MIDAZOLAM HCL 5 MG/5ML IJ SOLN
INTRAMUSCULAR | Status: DC | PRN
  Administered 2020-11-15: 2 mg via INTRAVENOUS

## 2020-11-15 MED ORDER — LIDOCAINE 2% (20 MG/ML) 5 ML SYRINGE
INTRAMUSCULAR | Status: DC | PRN
  Administered 2020-11-15: 100 mg via INTRAVENOUS

## 2020-11-15 MED ORDER — EPHEDRINE 5 MG/ML INJ
INTRAVENOUS | Status: AC
  Filled 2020-11-15: qty 10

## 2020-11-15 MED ORDER — FENTANYL CITRATE (PF) 100 MCG/2ML IJ SOLN
INTRAMUSCULAR | Status: DC | PRN
  Administered 2020-11-15 (×2): 50 ug via INTRAVENOUS

## 2020-11-15 MED ORDER — ONDANSETRON HCL 4 MG/2ML IJ SOLN
INTRAMUSCULAR | Status: AC
  Filled 2020-11-15: qty 2

## 2020-11-15 MED ORDER — FENTANYL CITRATE (PF) 100 MCG/2ML IJ SOLN: 25.0000 ug | INTRAMUSCULAR | Status: DC | PRN

## 2020-11-15 MED ORDER — FENTANYL CITRATE (PF) 100 MCG/2ML IJ SOLN
INTRAMUSCULAR | Status: AC
  Filled 2020-11-15: qty 2

## 2020-11-15 MED ORDER — OXYCODONE HCL 5 MG PO TABS: 5.0000 mg | ORAL_TABLET | Freq: Once | ORAL | Status: DC | PRN

## 2020-11-15 MED ORDER — PROPOFOL 10 MG/ML IV BOLUS
INTRAVENOUS | Status: AC
  Filled 2020-11-15: qty 20

## 2020-11-15 MED ORDER — ONDANSETRON HCL 4 MG/2ML IJ SOLN: 4.0000 mg | Freq: Once | INTRAMUSCULAR | Status: DC | PRN

## 2020-11-15 MED ORDER — OXYCODONE-ACETAMINOPHEN 5-325 MG PO TABS: 1.0000 | ORAL_TABLET | ORAL | 0 refills | Status: DC | PRN

## 2020-11-15 MED ORDER — OXYCODONE HCL 5 MG/5ML PO SOLN: 5.0000 mg | Freq: Once | ORAL | Status: DC | PRN

## 2020-11-15 MED ORDER — MIDAZOLAM HCL 2 MG/2ML IJ SOLN
INTRAMUSCULAR | Status: AC
  Filled 2020-11-15: qty 2

## 2020-11-15 MED ORDER — LIDOCAINE HCL (PF) 2 % IJ SOLN
INTRAMUSCULAR | Status: AC
  Filled 2020-11-15: qty 5

## 2020-11-15 MED ORDER — SULFAMETHOXAZOLE-TRIMETHOPRIM 800-160 MG PO TABS: 1.0000 | ORAL_TABLET | Freq: Two times a day (BID) | ORAL | 0 refills | Status: DC

## 2020-11-15 MED ORDER — ONDANSETRON HCL 4 MG/2ML IJ SOLN
INTRAMUSCULAR | Status: DC | PRN
  Administered 2020-11-15: 4 mg via INTRAVENOUS

## 2020-11-15 MED ORDER — PROPOFOL 10 MG/ML IV BOLUS
INTRAVENOUS | Status: DC | PRN
Start: 2020-11-15 — End: 2020-11-15
  Administered 2020-11-15: 200 mg via INTRAVENOUS

## 2020-11-15 MED ORDER — DEXAMETHASONE SODIUM PHOSPHATE 10 MG/ML IJ SOLN
INTRAMUSCULAR | Status: AC
  Filled 2020-11-15: qty 1

## 2020-11-15 MED ORDER — DEXTROSE 5 % IV SOLN
15.0000 mg/kg | Freq: Once | INTRAVENOUS | Status: AC
  Administered 2020-11-15: 900 mg via INTRAVENOUS
  Filled 2020-11-15: qty 3.6

## 2020-11-15 MED ORDER — SODIUM CHLORIDE 0.9 % IR SOLN
Status: DC | PRN
  Administered 2020-11-15: 1500 mL

## 2020-11-15 SURGICAL SUPPLY — 29 items
BAG DRAIN URO-CYSTO SKYTR STRL (DRAIN) ×3 IMPLANT
BAG DRN UROCATH (DRAIN) ×2
BASKET LASER NITINOL 1.9FR (BASKET) ×2 IMPLANT
BSKT STON RTRVL 120 1.9FR (BASKET) ×2
CATH INTERMIT  6FR 70CM (CATHETERS) ×2 IMPLANT
CLOTH BEACON ORANGE TIMEOUT ST (SAFETY) ×3 IMPLANT
DRSG TELFA 3X8 NADH (GAUZE/BANDAGES/DRESSINGS) ×3 IMPLANT
FIBER LASER FLEXIVA 365 (UROLOGICAL SUPPLIES) IMPLANT
FORCEPS BIOP 2.4F 115CM BACKLD (INSTRUMENTS) ×2 IMPLANT
GLOVE SURG ENC MOIS LTX SZ7.5 (GLOVE) ×3 IMPLANT
GOWN STRL REUS W/TWL LRG LVL3 (GOWN DISPOSABLE) ×3 IMPLANT
GUIDEWIRE ANG ZIPWIRE 038X150 (WIRE) ×5 IMPLANT
GUIDEWIRE STR DUAL SENSOR (WIRE) ×5 IMPLANT
IV NS 1000ML (IV SOLUTION)
IV NS 1000ML BAXH (IV SOLUTION) ×1 IMPLANT
IV NS IRRIG 3000ML ARTHROMATIC (IV SOLUTION) ×3 IMPLANT
KIT TURNOVER CYSTO (KITS) ×3 IMPLANT
MANIFOLD NEPTUNE II (INSTRUMENTS) ×3 IMPLANT
NS IRRIG 500ML POUR BTL (IV SOLUTION) ×3 IMPLANT
PACK CYSTO (CUSTOM PROCEDURE TRAY) ×3 IMPLANT
PAD DRESSING TELFA 3X8 NADH (GAUZE/BANDAGES/DRESSINGS) IMPLANT
SHEATH URETERAL 12FRX28CM (UROLOGICAL SUPPLIES) ×2 IMPLANT
STENT POLARIS LOOP 8FR X 24 CM (STENTS) ×2 IMPLANT
SYR 10ML LL (SYRINGE) ×3 IMPLANT
TRACTIP FLEXIVA PULS ID 200XHI (Laser) IMPLANT
TRACTIP FLEXIVA PULSE ID 200 (Laser)
TUBE CONNECTING 12X1/4 (SUCTIONS) ×3 IMPLANT
TUBE FEEDING 8FR 16IN STR KANG (MISCELLANEOUS) ×2 IMPLANT
TUBING UROLOGY SET (TUBING) ×3 IMPLANT

## 2020-11-15 NOTE — Discharge Instructions (Addendum)
1 - You may have urinary urgency (bladder spasms) and bloody urine on / off with stent in place. This is normal.  2 - Remove tethered stent at home on Monday morning by pulling on string, then blue-white plastic tubing, and discarding. Office is open Monday if any problems arise.   3 - Call MD or go to ER for fever >102, severe pain / nausea / vomiting not relieved by medications, or acute change in medical status  [facility] [phone] Post Ureteroscopy With or Without Stent Instructions  Definitions:  Ureter: The duct that transports urine from the kidney to the bladder. Stent:   A plastic hollow tube that is placed into the ureter, from the kidney to the bladder to prevent the ureter from swelling shut.  GENERAL INSTRUCTIONS:  Despite the fact that no skin incisions were used, the area around the ureter and bladder is raw and irritated. The stent is a foreign body which will further irritate the bladder wall. This irritation is manifested by increased frequency of urination, both day and night, and by an increase in the urge to urinate. In some, the urge to urinate is present almost always. Sometimes the urge is strong enough that you may not be able to stop yourself from urinating. The only real cure is to remove the stent and then give time for the bladder wall to heal which can't be done until the danger of the ureter swelling shut has passed, which varies.  You may see some blood in your urine while the stent is in place and a few days afterwards. Do not be alarmed, even if the urine was clear for a while. Get off your feet and drink lots of fluids until clearing occurs. If you start to pass clots or don't improve, call us.  DIET: You may return to your normal diet immediately. Because of the raw surface of your bladder, alcohol, spicy foods, acid type foods and drinks with caffeine may cause irritation or frequency and should be used in moderation. To keep your urine  flowing freely and to avoid constipation, drink plenty of fluids during the day ( 8-10 glasses ). Tip: Avoid cranberry juice because it is very acidic.  ACTIVITY: Your physical activity doesn't need to be restricted. However, if you are very active, you may see some blood in your urine. We suggest that you reduce your activity under these circumstances until the bleeding has stopped.  BOWELS: It is important to keep your bowels regular during the postoperative period. Straining with bowel movements can cause bleeding. A bowel movement every other day is reasonable. Use a mild laxative if needed, such as Milk of Magnesia 2-3 tablespoons, or 2 Dulcolax tablets. Call if you continue to have problems. If you have been taking narcotics for pain, before, during or after your surgery, you may be constipated. Take a laxative if necessary.   MEDICATION: You should resume your pre-surgery medications unless told not to. In addition you will often be given an antibiotic to prevent infection. These should be taken as prescribed until the bottles are finished unless you are having an unusual reaction to one of the drugs.  PROBLEMS YOU SHOULD REPORT TO [place]: Fevers over 100.5 Fahrenheit. Heavy bleeding, or clots ( See above notes about blood in urine ). Inability to urinate. Drug reactions ( hives, rash, nausea, vomiting, diarrhea ). Severe burning or pain with urination that is not improving.  FOLLOW-UP: You will need a follow-up appointment to monitor your  progress. Call for this appointment at the number listed above. Usually the first appointment will be about three to fourteen days after your surgery.   Post Anesthesia Home Care Instructions  Activity: Get plenty of rest for the remainder of the day. A responsible individual must stay with you for 24 hours following the procedure.  For the next 24 hours, DO NOT: -Drive a car -Advertising copywriter -Drink alcoholic beverages -Take any medication  unless instructed by your physician -Make any legal decisions or sign important papers.  Meals: Start with liquid foods such as gelatin or soup. Progress to regular foods as tolerated. Avoid greasy, spicy, heavy foods. If nausea and/or vomiting occur, drink only clear liquids until the nausea and/or vomiting subsides. Call your physician if vomiting continues.  Special Instructions/Symptoms: Your throat may feel dry or sore from the anesthesia or the breathing tube placed in your throat during surgery. If this causes discomfort, gargle with warm salt water. The discomfort should disappear within 24 hours.

## 2020-11-15 NOTE — Brief Op Note (Signed)
11/15/2020  12:57 PM  PATIENT:  [name]  40 y.o. female  PRE-OPERATIVE DIAGNOSIS:  RECURRENT STONES  POST-OPERATIVE DIAGNOSIS:  RECURRENT STONES  PROCEDURE:  Procedure(s) with comments: CYSTOSCOPY WITH RETROGRADE PYELOGRAM, LEFT DIAGNOSTIC URETEROSCOPY, RIGHT DIAGNOSTIC BIOPSY; RIGHT STENT PLACEMENT (Bilateral) - 75 MINS  SURGEON:  Surgeon(s) and Role:    * Sebastian Ache, MD - Primary  PHYSICIAN ASSISTANT:   ASSISTANTS: none   ANESTHESIA:   general  EBL:  minimal   BLOOD ADMINISTERED:none  DRAINS: none   LOCAL MEDICATIONS USED:  NONE  SPECIMEN:  Source of Specimen:  Rt ureteral polyp  DISPOSITION OF SPECIMEN:  PATHOLOGY  COUNTS:  YES  TOURNIQUET:  * No tourniquets in log *  DICTATION: .Other Dictation: Dictation Number [id number]  PLAN OF CARE: Discharge to home after PACU  PATIENT DISPOSITION:  PACU - hemodynamically stable.   Delay start of Pharmacological VTE agent (>24hrs) due to surgical blood loss or risk of bleeding: yes

## 2020-11-15 NOTE — H&P (Signed)
[name] is an 40 y.o. female.    Chief Complaint: Pre-Op BILATERAL Ureteroscopic Stone Manipultion  HPI:   1 - Recurrent Urolithiasis -  Pre 2020 - PCNL, URS, SWL x many  12/2018 - Bilateral ureteroscopy to stone free for Rt renal and UPJ stones and punctate left.   Recent Surveillance:  10/2019 - KUB, RUS Mild-mod Rt hydro and puncatate renal stones (likely stable dilation, obstruction not ruled out)   2 - Medical STone Disease - She is on HCTZ daily for BP.  Eval 2020: BMP, PTH, Urate- normal; Composition - pending; 24 Hr Urines - pending (pt not compliant x many).   3 - Left Atrophic Kidney - left partially atrophic kidney by CT 11/2018.   PMH otherwise unremarkable. 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   Today " [name] " is seen to proceed with BILATERAL ureteroscopic stone manipulation to remove likely colonized nidues for recurrent cystitis. Most recent UCX e. Coli sens to bactrim which she has been on pre-op to maximally reduce colonization. No interval fevers.    Past Medical History:  Diagnosis Date   Anxiety    Depression    Frequency of urination    Headache    occ migraine and tension headaches   History of kidney stones    Hypertension    followed by pcp   Right ureteral stone    Urgency of urination     Past Surgical History:  Procedure Laterality Date   APPENDECTOMY  age 21   ARTHOSCOPIC 48 CUFF REPAIR Right 10/31/2019   Procedure: ARTHROSCOPIC ROTATOR CUFF REPAIR;  Surgeon: Vickki Hearing, MD;  Location: [facility];  Service: Orthopedics;  Laterality: Right;   BALLOON DILATION Left 01/17/2013   Procedure: BALLOON DILATION OF LEFT URETERAL STRICTURE;  Surgeon: Ky Barban, MD;  Location: [facility];  Service: Urology;  Laterality: Left;   CYSTOSCOPY W/ URETERAL STENT PLACEMENT Left 01/17/2013   Procedure: CYSTOSCOPY WITH LEFT RETROGRADE PYELOGRAM; LEFT URETERAL STENT PLACEMENT;  Surgeon: Ky Barban, MD;  Location: [facility];  Service: Urology;   Laterality: Left;   CYSTOSCOPY WITH RETROGRADE PYELOGRAM, URETEROSCOPY AND STENT PLACEMENT Bilateral 12/23/2018   Procedure: CYSTOSCOPY WITH RETROGRADE PYELOGRAM, URETEROSCOPY AND STENT PLACEMENT;  Surgeon: Sebastian Ache, MD;  Location: [facility];  Service: Urology;  Laterality: Bilateral;  90 MINS   CYSTOSCOPY WITH RETROGRADE PYELOGRAM, URETEROSCOPY AND STENT PLACEMENT Bilateral 01/06/2019   Procedure: CYSTOSCOPY WITH RETROGRADE PYELOGRAM, URETEROSCOPY AND STENT PLACEMENT;  Surgeon: Sebastian Ache, MD;  Location: [facility];  Service: Urology;  Laterality: Bilateral;  90 MINS   CYSTOSCOPY/URETEROSCOPY/HOLMIUM LASER/STENT PLACEMENT  2008;  2010;  2011;  2012   FLEXIBLE URETEROSCOPY Left 01/17/2013   Procedure: FLEXIBLE LEFT URETEROSCOPY; ATTEMPTED STONE BASKET EXTRACTION;  Surgeon: Ky Barban, MD;  Location: [facility];  Service: Urology;  Laterality: Left;   HOLMIUM LASER APPLICATION Bilateral 12/23/2018   Procedure: HOLMIUM LASER APPLICATION;  Surgeon: Sebastian Ache, MD;  Location: [facility];  Service: Urology;  Laterality: Bilateral;   HOLMIUM LASER APPLICATION Bilateral 01/06/2019   Procedure: HOLMIUM LASER APPLICATION;  Surgeon: Sebastian Ache, MD;  Location: [facility];  Service: Urology;  Laterality: Bilateral;    History reviewed. No pertinent family history. Social History:  reports that she has been smoking cigars. She has never used smokeless tobacco. She reports current alcohol use. She reports that she does not use drugs.  Allergies:  Allergies  Allergen Reactions   Adhesive [Tape] Rash  No medications prior to admission.    No results found for this or any previous visit (from the past 48 hour(s)). No results found.  Review of Systems  Constitutional:  Negative for chills and fever.  All other systems reviewed and are negative.  Height 5\' 3"  (1.6 m), weight 70.8 kg, last menstrual period  11/01/2020. Physical Exam Vitals reviewed.  Constitutional:      Comments: Pleasant, at baseline.   HENT:     Head: Normocephalic.     Nose: Nose normal.  Cardiovascular:     Rate and Rhythm: Normal rate.  Pulmonary:     Effort: Pulmonary effort is normal.  Abdominal:     General: Abdomen is flat.  Genitourinary:    Comments: No CVAT at present Musculoskeletal:        General: Normal range of motion.     Cervical back: Normal range of motion.  Skin:    General: Skin is warm.  Neurological:     General: No focal deficit present.     Mental Status: She is alert.     Assessment/Plan  Proceed as planned with BILATERAL ureteroscopic stone manipulation. Risks, benefits, alternatives, expected peri-op course discussed previously and reiterated today.   11/03/2020, MD 11/15/2020, 8:03 AM

## 2020-11-15 NOTE — Anesthesia Procedure Notes (Signed)
Procedure Name: LMA Insertion Date/Time: 11/15/2020 12:14 PM Performed by: Bishop Limbo, CRNA Pre-anesthesia Checklist: Patient identified, Emergency Drugs available, Suction available and Patient being monitored Patient Re-evaluated:Patient Re-evaluated prior to induction Oxygen Delivery Method: Circle System Utilized Preoxygenation: Pre-oxygenation with 100% oxygen Induction Type: IV induction Ventilation: Mask ventilation without difficulty LMA: LMA inserted LMA Size: 4.0 Number of attempts: 1 Placement Confirmation: positive ETCO2 Tube secured with: Tape Dental Injury: Teeth and Oropharynx as per pre-operative assessment

## 2020-11-15 NOTE — Anesthesia Postprocedure Evaluation (Signed)
Anesthesia Post Note  Patient: [name]  Procedure(s) Performed: CYSTOSCOPY WITH RETROGRADE PYELOGRAM, LEFT DIAGNOSTIC URETEROSCOPY, RIGHT DIAGNOSTIC URETEROSCOPY WITH BIOPSY; RIGHT STENT PLACEMENT (Bilateral: Renal)     Patient location during evaluation: PACU Anesthesia Type: General Level of consciousness: awake and alert Pain management: pain level controlled Vital Signs Assessment: post-procedure vital signs reviewed and stable Respiratory status: spontaneous breathing, nonlabored ventilation, respiratory function stable and patient connected to nasal cannula oxygen Cardiovascular status: blood pressure returned to baseline and stable Postop Assessment: no apparent nausea or vomiting Anesthetic complications: no   No notable events documented.  Last Vitals:  Vitals:   11/15/20 1330 11/15/20 1352  BP: 128/76 130/89  Pulse: 77 69  Resp: 13 12  Temp:  36.6 C  SpO2: 97% 100%    Last Pain:  Vitals:   11/15/20 1352  TempSrc:   PainSc: 0-No pain                 Calisa Luckenbaugh S

## 2020-11-15 NOTE — Anesthesia Preprocedure Evaluation (Signed)
Anesthesia Evaluation  Patient identified by MRN, date of birth, ID band Patient awake    Reviewed: Allergy & Precautions, NPO status , Patient's Chart, lab work & pertinent test results  Airway Mallampati: II  TM Distance: >3 FB Neck ROM: Full    Dental no notable dental hx.    Pulmonary Current Smoker and Patient abstained from smoking.,    Pulmonary exam normal breath sounds clear to auscultation       Cardiovascular hypertension, Pt. on medications Normal cardiovascular exam Rhythm:Regular Rate:Normal     Neuro/Psych negative neurological ROS  negative psych ROS   GI/Hepatic negative GI ROS, Neg liver ROS,   Endo/Other  negative endocrine ROS  Renal/GU negative Renal ROS  negative genitourinary   Musculoskeletal negative musculoskeletal ROS (+)   Abdominal   Peds negative pediatric ROS (+)  Hematology negative hematology ROS (+)   Anesthesia Other Findings   Reproductive/Obstetrics negative OB ROS                             Anesthesia Physical Anesthesia Plan  ASA: 2  Anesthesia Plan: General   Post-op Pain Management:    Induction: Intravenous  PONV Risk Score and Plan: 2 and Ondansetron, Dexamethasone and Treatment may vary due to age or medical condition  Airway Management Planned: LMA  Additional Equipment:   Intra-op Plan:   Post-operative Plan: Extubation in OR  Informed Consent: I have reviewed the patients History and Physical, chart, labs and discussed the procedure including the risks, benefits and alternatives for the proposed anesthesia with the patient or authorized representative who has indicated his/her understanding and acceptance.     Dental advisory given  Plan Discussed with: CRNA and Surgeon  Anesthesia Plan Comments:         Anesthesia Quick Evaluation

## 2020-11-15 NOTE — Transfer of Care (Signed)
Immediate Anesthesia Transfer of Care Note  Patient: [name]  Procedure(s) Performed: CYSTOSCOPY WITH RETROGRADE PYELOGRAM, LEFT DIAGNOSTIC URETEROSCOPY, RIGHT DIAGNOSTIC URETEROSCOPY WITH BIOPSY; RIGHT STENT PLACEMENT (Bilateral: Renal)  Patient Location: PACU  Anesthesia Type:General  Level of Consciousness: awake, alert , oriented and patient cooperative  Airway & Oxygen Therapy: Patient Spontanous Breathing and Patient connected to face mask oxygen  Post-op Assessment: Report given to RN and Post -op Vital signs reviewed and stable  Post vital signs: Reviewed and stable  Last Vitals:  Vitals Value Taken Time  BP 138/81 11/15/20 1307  Temp    Pulse 79 11/15/20 1310  Resp 13 11/15/20 1310  SpO2 97 % 11/15/20 1310  Vitals shown include unvalidated device data.  Last Pain:  Vitals:   11/15/20 1053  TempSrc: Oral  PainSc: 0-No pain      Patients Stated Pain Goal: 5 (11/15/20 1053)  Complications: No notable events documented.

## 2020-11-18 ENCOUNTER — Encounter (HOSPITAL_BASED_OUTPATIENT_CLINIC_OR_DEPARTMENT_OTHER): Payer: Self-pay | Admitting: Urology

## 2020-11-18 LAB — SURGICAL PATHOLOGY

## 2020-11-18 NOTE — Op Note (Signed)
NAMESOWMYA, [name] MEDICAL RECORD NO: [id number] ACCOUNT NO: [id number] DATE OF BIRTH: [birth year] FACILITY: [facility] LOCATION: [facility] PHYSICIAN: Sebastian Ache, MD  Operative Report   DATE OF PROCEDURE: 11/15/2020  PREOPERATIVE DIAGNOSES:  Recurrent urinary tract infections, history of urolithiasis, hydronephrosis on the right.  POSTOPERATIVE DIAGNOSIS:  Small right ureteral polyp.  PROCEDURES: 1.  Cystoscopy, bilateral retrograde pyelograms interpretation. 2.  Left diagnostic ureteroscopy. 3.  Right ureteroscopy with biopsy. 4.  Right ureteral stent placement, 8 x 24 Polaris with tether.  ESTIMATED BLOOD LOSS:  Nil.  COMPLICATIONS:  None.  SPECIMENS:  Right ureteral polyp for permanent pathology.  FINDINGS:  1.  No evidence of intraluminal urolithiasis on inspection of the bilateral kidneys and ureters and bladder. 2.  Small right ureteral polyp partially obstructing the proximal fourth of the ureter, photodocumentation performed. 3.  Resolution of partially obstructing right ureteral polyp following biopsy. 4.  Successful placement of right ureteral stent, proximal end in renal pelvis, distal end in urinary bladder.  INDICATIONS:  The patient is a very pleasant 40 year old lady with a longstanding history of recurrent high-risk urolithiasis.  She has been on surveillance protocol for a number of years, she is variably complaint.  Most recently, she was found to have  recurrent cystitis with multidrug resistant organisms.  Evaluation for this including renal imaging revealed some mild right hydronephrosis and questionable papillary tip calcifications bilaterally. Unremarkable bladder. It was felt that stone  colonization was most likely the source of her recurrent infections.  Options were discussed including continued periodic antibiotic use versus ureteroscopy with the goal of stone free, to rule out other sources of obstruction and colonization.  She wished to  proceed.   Informed consent was obtained and placed in the medical record.  PROCEDURE IN DETAIL:  The patient being verified, procedure being bilateral ureteroscopic stone manipulation was confirmed.  Procedure timeout was performed and intravenous antibiotics administered. General LMA anesthesia induced.  The patient placed  into a low lithotomy position.  A sterile field was created, prepped and draped the patient's vagina, introitus and proximal thigh using iodine.  Cystourethroscopy was performed using 21-French rigid cystoscope with offset lens.  Inspection of urinary  bladder revealed no diverticula, calcifications or papillary lesions.  Ureteral orifices were singleton.  The left ureteral orifice was cannulated with 6-French end-hole catheter and left retrograde pyelogram was obtained.  Left retrograde pyelogram demonstrated single left ureter, single system left kidney.  No filling defects or narrowing noted.  A ZIPwire was advanced to the level of upper pole, set aside as a safety wire.  Next, right retrograde pyelogram was obtained.  Right retrograde pyelogram demonstrated single right ureter, single system right kidney.  There was very mild right caliectasis.  No obvious filling defects or narrowing noted.  The ZIPwire was advanced to the level of upper pole, set aside as a safety  wire.  An 8-French feeding tube placed in the urinary bladder for pressure release.  A semirigid ureteroscopy was performed of the distal right ureter alongside a separate sensor working wire and the distal four-fifths of the ureter carefully inspected.   There were no mucosal abnormalities.  Next, semirigid ureteroscopy was performed to the distal four-fifths of the left ureter alongside a separate sensor working wire. No mucosal abnormalities or calcifications were noted.  Semirigid scope was then  exchanged for a 12/14 short length ureteral access sheath using continuous fluoroscopic guidance to the level of the mid ureter  on the left side and flexible digital  ureteroscopy was performed of the proximal left ureter with systematic inspection of the  left kidney including all calices x3.  There is no evidence of intraluminal stones seen whatsoever on the left side.  No obvious high grad obstruction, was quite favorable, so that any stone seen radiographically was therefore submucosal and less likely  thought to be source of any sort of colonization. Left access sheath was removed under continuous vision.  No significant mucosal abnormalities were found.  Next, the access sheath was placed over the right sensor working wire to the level of proximal  right ureter using continuous fluoroscopic guidance and flexible digital ureteroscopy was performed of the proximal right ureter with systematic inspection of the right kidney including all calices x3.  Interestingly, there was no evidence of  intraluminal stone either on the right side; however, there was a small ureteral polyp noted in the proximal portion of the ureter.  Photodocumentation performed.  This did appear to be partially obstructing. There is certainly not any sort of high-grade  obstruction.  As such, the attempt was made to biopsy the area and resected using the BIGopsy apparatus set at multiple angulations.  This is not successful, likely due to the tangential sampling.  Next, an Escape basket was used in an effort to sample  the polyp.  This resulted in excellent sampling of the polyp and essentially complete removal of the partially obstructing lesion, this did appear likely benign.  There was no obvious stone material within this, removed and set aside for permanent  pathology.  The entire right ureter was once again inspected.  There was excellent hemostasis and no evidence of any perforation.  The kidney was inspected as well and again no obstructing stones were seen. Given access sheath usage and biopsy on the  right side, it was felt that interval stent will  be warranted.  As such an 8 x 24 Polaris type stent was placed over the safety wire using fluoroscopic guidance.  Good proximal and distal curl was noted.  Tether was left in place and tucked per vagina.    safety wire was removed and the procedure was terminated.  The patient tolerated the procedure well.  No immediate perioperative complications.  The patient was taken to postanesthesia care in stable condition.  Plan for discharge home.   SHW D: 11/15/2020 1:05:28 pm T: 11/16/2020 1:29:00 am  JOB: [id number]/ [id number]

## 2021-01-19 IMAGING — CT CT RENAL STONE PROTOCOL
2 of 4 series · 15 of 46 positions shown, 17 images · non-contrast
Comparison: May 01, 2013.

CLINICAL DATA: Flank pain.  Right lower quadrant abdominal pain.

EXAM:
CT ABDOMEN AND PELVIS WITHOUT CONTRAST
TECHNIQUE: Multidetector CT imaging of the abdomen and pelvis was performed
following the standard protocol without IV contrast.

[Series 2: axial st · axial · 0.71mm/px · z∈[+786,+1186]mm · 12 of 90 slices shown, 14 images]
[im 5/90  soft-tissue]
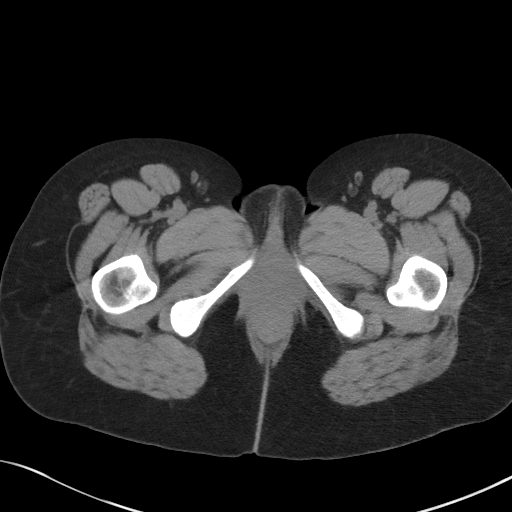
[im 5/90  bone]
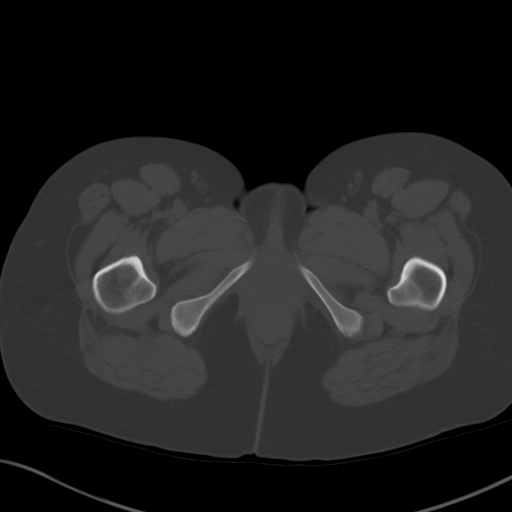
[im 15/90  soft-tissue]
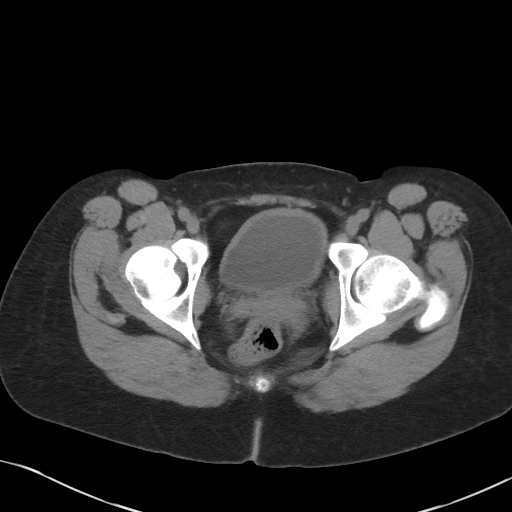
[im 20/90  soft-tissue]
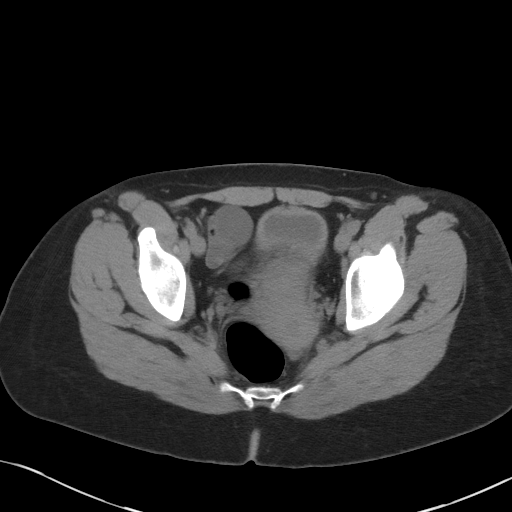
[im 25/90  soft-tissue]
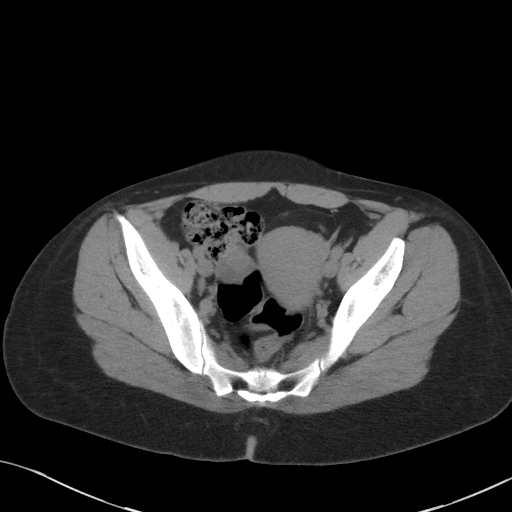
[im 35/90  soft-tissue]
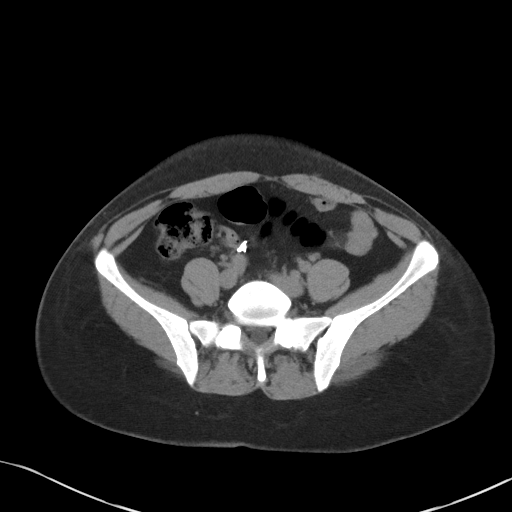
[im 40/90  soft-tissue]
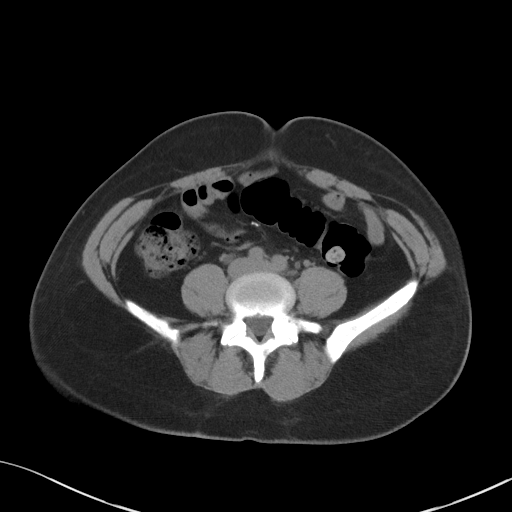
[im 50/90  soft-tissue]
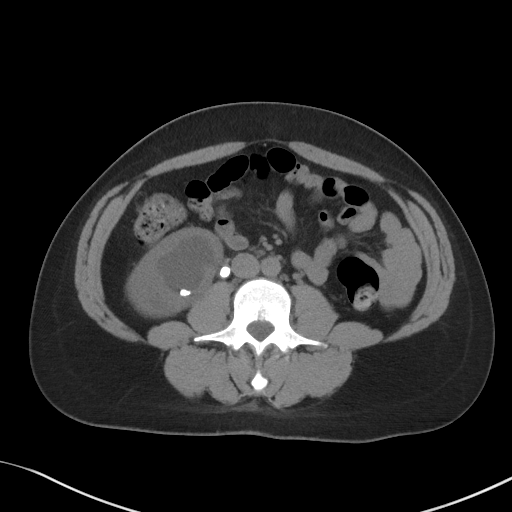
[im 55/90  soft-tissue]
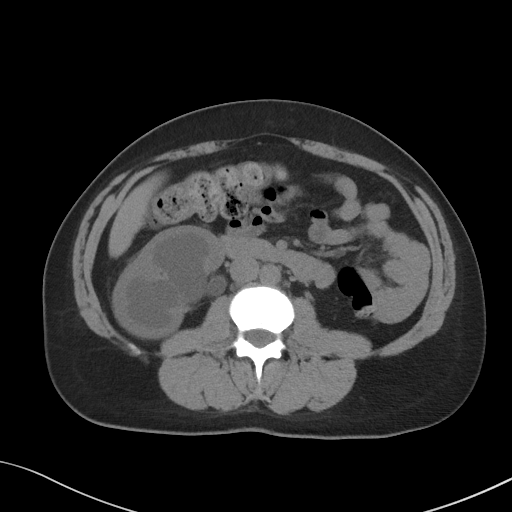
[im 65/90  soft-tissue]
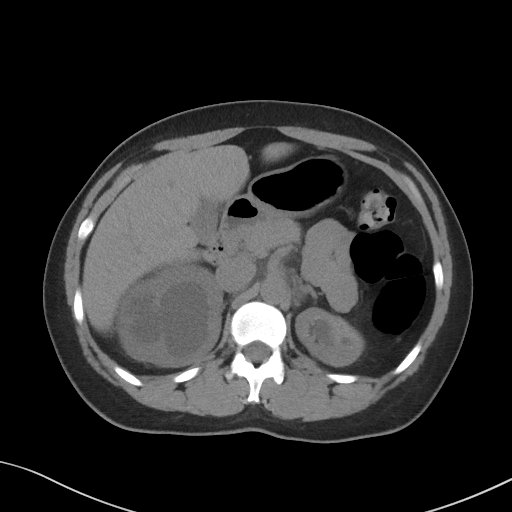
[im 65/90  bone]
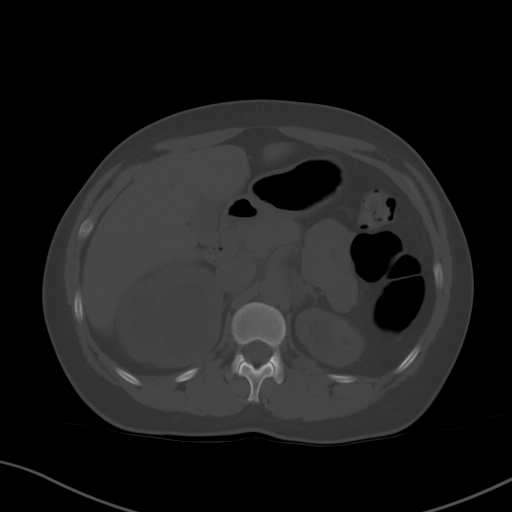
[im 70/90  soft-tissue]
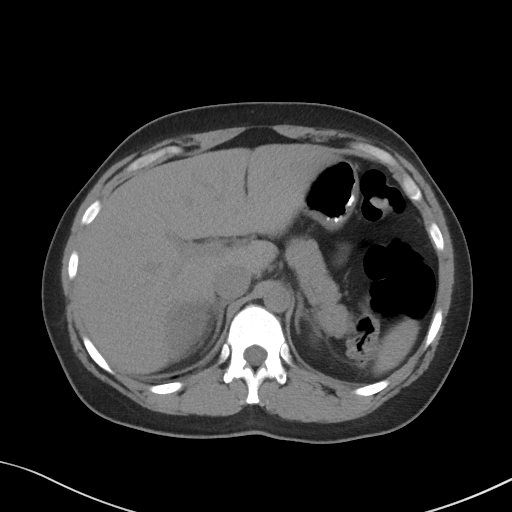
[im 75/90  soft-tissue]
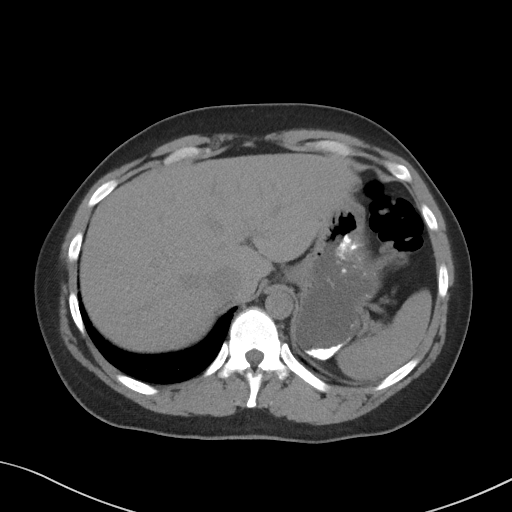
[im 85/90  soft-tissue]
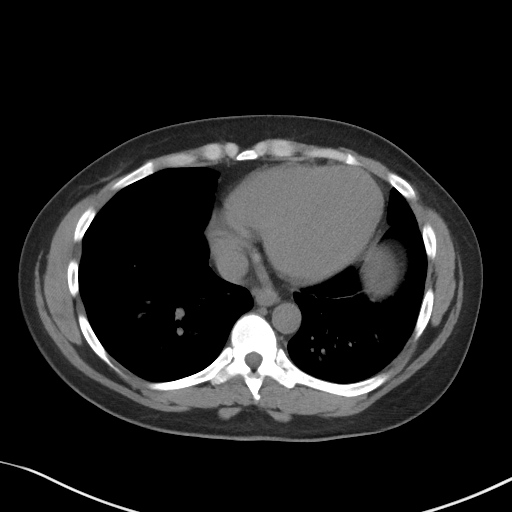

[Series 5: coronal st · coronal · 0.79mm/px · 3 of 101 slices shown]
[im 34/101  soft-tissue]
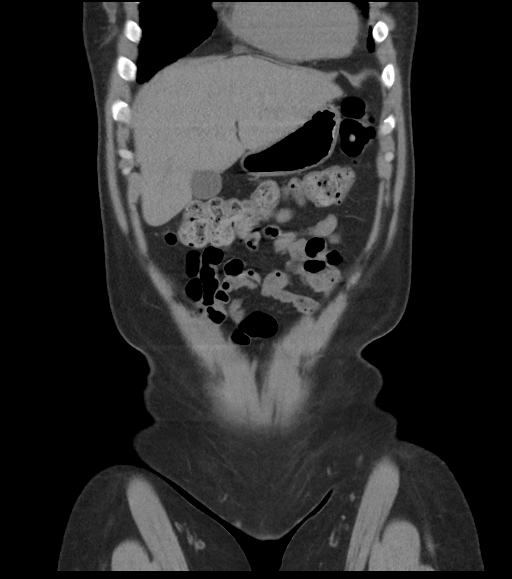
[im 45/101  soft-tissue]
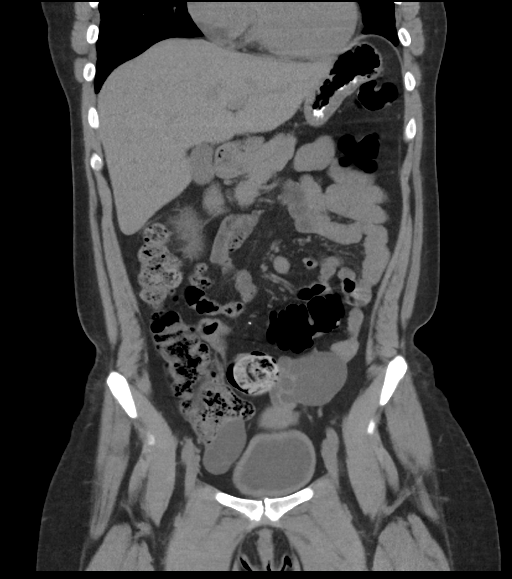
[im 56/101  soft-tissue]
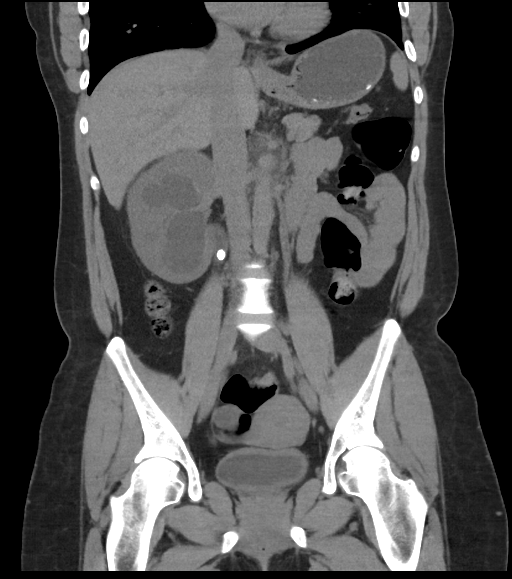

[15 of 46 positions shown; findings below may reference images not displayed]

FINDINGS: Lower chest: There is interlobular septal thickening at the lung
bases bilaterally with a few small airspace opacities, especially
involving the partially visualized left lower lobe.The heart is
enlarged.

Hepatobiliary: The liver is normal. Normal gallbladder.There is no
biliary ductal dilation.

Pancreas: Normal contours without ductal dilatation. No
peripancreatic fluid collection.

Spleen: No splenic laceration or hematoma.

Adrenals/Urinary Tract:

--Adrenal glands: No adrenal hemorrhage.

--Right kidney/ureter: There is severe right-sided
hydroureteronephrosis secondary to an obstructing 8 mm stone in the
proximal right ureter. There are additional nonobstructing stones in
the right kidney measuring up to approximately 8 mm. There is mild
dilatation of the remaining portions of the right ureter beyond the
obstructing stone.

--Left kidney/ureter: The left kidney is atrophic with multiple
nonobstructing stones primarily within the lower pole.

--Urinary bladder: There is diffuse bladder wall thickening.

Stomach/Bowel:

--Stomach/Duodenum: No hiatal hernia or other gastric abnormality.
Normal duodenal course and caliber.

--Small bowel: No dilatation or inflammation.

--Colon: No focal abnormality.

--Appendix: Surgically absent.

Vascular/Lymphatic: Normal course and caliber of the major abdominal
vessels.

--No retroperitoneal lymphadenopathy.

--No mesenteric lymphadenopathy.

--No pelvic or inguinal lymphadenopathy.

Reproductive: There is a dominant 4.7 cm left ovarian cyst that
appears to contain a thin internal septation on the coronal series.
There is a dominant 4.6 cm right ovarian cyst that contains a thin
internal septation.

Other: No ascites or free air. The abdominal wall is normal.

Musculoskeletal. No acute displaced fractures.
IMPRESSION: 1. Severe right-sided hydroureteronephrosis secondary to an
obstructing 8 mm stone in the proximal right ureter. There are
additional nonobstructing stones in the lower pole the right kidney.
2. Atrophic left kidney with multiple nonobstructing stones in the
lower pole.
3. Bladder wall thickening of unknown clinical significance.
Correlation with urinalysis is recommended.
4. Mildly complex bilateral ovarian cysts as detailed above. A
follow-up outpatient nonemergent pelvic ultrasound is recommended in
6-8 weeks for further evaluation.
5. Interlobular septal thickening involving the lung bases, left
worse than right. Findings may be secondary to volume overload or an
atypical infectious process.
6. Cardiomegaly.

## 2021-01-28 ENCOUNTER — Other Ambulatory Visit (HOSPITAL_COMMUNITY): Payer: Self-pay | Admitting: Physician Assistant

## 2021-01-28 DIAGNOSIS — R928 Other abnormal and inconclusive findings on diagnostic imaging of breast: Secondary | ICD-10-CM

## 2021-02-11 ENCOUNTER — Other Ambulatory Visit: Payer: Self-pay

## 2021-02-11 ENCOUNTER — Ambulatory Visit (HOSPITAL_COMMUNITY)
Admission: RE | Admit: 2021-02-11 | Discharge: 2021-02-11 | Disposition: A | Discharge status: Home / Self Care | Payer: 59 | Source: Ambulatory Visit | Attending: Physician Assistant | Admitting: Physician Assistant

## 2021-02-11 DIAGNOSIS — R928 Other abnormal and inconclusive findings on diagnostic imaging of breast: Secondary | ICD-10-CM | POA: Diagnosis not present

## 2021-05-06 ENCOUNTER — Emergency Department (HOSPITAL_COMMUNITY): Payer: 59

## 2021-05-06 ENCOUNTER — Encounter (HOSPITAL_COMMUNITY): Payer: Self-pay

## 2021-05-06 ENCOUNTER — Emergency Department (HOSPITAL_COMMUNITY)
Admission: EM | Admit: 2021-05-06 | Discharge: 2021-05-06 | Disposition: A | Discharge status: Home / Self Care | Payer: 59 | Attending: Emergency Medicine | Admitting: Emergency Medicine

## 2021-05-06 ENCOUNTER — Other Ambulatory Visit: Payer: Self-pay

## 2021-05-06 DIAGNOSIS — I10 Essential (primary) hypertension: Secondary | ICD-10-CM | POA: Diagnosis not present

## 2021-05-06 DIAGNOSIS — Z79899 Other long term (current) drug therapy: Secondary | ICD-10-CM | POA: Diagnosis not present

## 2021-05-06 DIAGNOSIS — R7989 Other specified abnormal findings of blood chemistry: Secondary | ICD-10-CM | POA: Diagnosis not present

## 2021-05-06 DIAGNOSIS — Z87442 Personal history of urinary calculi: Secondary | ICD-10-CM | POA: Insufficient documentation

## 2021-05-06 DIAGNOSIS — N12 Tubulo-interstitial nephritis, not specified as acute or chronic: Secondary | ICD-10-CM | POA: Insufficient documentation

## 2021-05-06 DIAGNOSIS — N7011 Chronic salpingitis: Secondary | ICD-10-CM | POA: Insufficient documentation

## 2021-05-06 DIAGNOSIS — N289 Disorder of kidney and ureter, unspecified: Secondary | ICD-10-CM | POA: Diagnosis not present

## 2021-05-06 DIAGNOSIS — R109 Unspecified abdominal pain: Secondary | ICD-10-CM | POA: Diagnosis present

## 2021-05-06 LAB — CBC WITH DIFFERENTIAL/PLATELET
Abs Immature Granulocytes: 0.11 10*3/uL — ABNORMAL HIGH (ref 0.00–0.07)
Basophils Absolute: 0.1 10*3/uL (ref 0.0–0.1)
Basophils Relative: 1 %
Eosinophils Absolute: 0.2 10*3/uL (ref 0.0–0.5)
Eosinophils Relative: 2 %
HCT: 45.2 % (ref 36.0–46.0)
Hemoglobin: 14.2 g/dL (ref 12.0–15.0)
Immature Granulocytes: 1 %
Lymphocytes Relative: 21 %
Lymphs Abs: 2.4 10*3/uL (ref 0.7–4.0)
MCH: 26.6 pg (ref 26.0–34.0)
MCHC: 31.4 g/dL (ref 30.0–36.0)
MCV: 84.6 fL (ref 80.0–100.0)
Monocytes Absolute: 0.7 10*3/uL (ref 0.1–1.0)
Monocytes Relative: 6 %
Neutro Abs: 8.2 10*3/uL — ABNORMAL HIGH (ref 1.7–7.7)
Neutrophils Relative %: 69 %
Platelets: 430 10*3/uL — ABNORMAL HIGH (ref 150–400)
RBC: 5.34 MIL/uL — ABNORMAL HIGH (ref 3.87–5.11)
RDW: 15.6 % — ABNORMAL HIGH (ref 11.5–15.5)
WBC: 11.7 10*3/uL — ABNORMAL HIGH (ref 4.0–10.5)
nRBC: 0 % (ref 0.0–0.2)

## 2021-05-06 LAB — PREGNANCY, URINE: Preg Test, Ur: NEGATIVE

## 2021-05-06 LAB — BASIC METABOLIC PANEL
Anion gap: 8 (ref 5–15)
BUN: 21 mg/dL — ABNORMAL HIGH (ref 6–20)
CO2: 24 mmol/L (ref 22–32)
Calcium: 9 mg/dL (ref 8.9–10.3)
Chloride: 106 mmol/L (ref 98–111)
Creatinine, Ser: 1.46 mg/dL — ABNORMAL HIGH (ref 0.44–1.00)
GFR, Estimated: 46 mL/min — ABNORMAL LOW (ref >60)
Glucose, Bld: 105 mg/dL — ABNORMAL HIGH (ref 70–99)
Potassium: 3.7 mmol/L (ref 3.5–5.1)
Sodium: 138 mmol/L (ref 135–145)

## 2021-05-06 LAB — URINALYSIS, ROUTINE W REFLEX MICROSCOPIC
Bilirubin Urine: NEGATIVE
Glucose, UA: NEGATIVE mg/dL
Ketones, ur: NEGATIVE mg/dL
Nitrite: POSITIVE — AB
Protein, ur: 30 mg/dL — AB
Specific Gravity, Urine: 1.017 (ref 1.005–1.030)
WBC, UA: >50 WBC/hpf — ABNORMAL HIGH (ref 0–5)
pH: 6 (ref 5.0–8.0)

## 2021-05-06 LAB — WET PREP, GENITAL
Sperm: NONE SEEN
Trich, Wet Prep: NONE SEEN
WBC, Wet Prep HPF POC: <10 (ref <10)
Yeast Wet Prep HPF POC: NONE SEEN

## 2021-05-06 MED ORDER — HYDROMORPHONE HCL 1 MG/ML IJ SOLN
1.0000 mg | Freq: Once | INTRAMUSCULAR | Status: AC
  Administered 2021-05-06: 1 mg via INTRAVENOUS
  Filled 2021-05-06: qty 1

## 2021-05-06 MED ORDER — CLONIDINE HCL 0.1 MG PO TABS
0.1000 mg | ORAL_TABLET | Freq: Once | ORAL | Status: AC
  Administered 2021-05-06: 0.1 mg via ORAL
  Filled 2021-05-06: qty 1

## 2021-05-06 MED ORDER — ONDANSETRON HCL 4 MG/2ML IJ SOLN
4.0000 mg | Freq: Once | INTRAMUSCULAR | Status: AC
  Administered 2021-05-06: 4 mg via INTRAVENOUS
  Filled 2021-05-06: qty 2

## 2021-05-06 MED ORDER — CEPHALEXIN 500 MG PO CAPS: 500.0000 mg | ORAL_CAPSULE | Freq: Four times a day (QID) | ORAL | 0 refills | Status: DC

## 2021-05-06 MED ORDER — IRBESARTAN 150 MG PO TABS
300.0000 mg | ORAL_TABLET | Freq: Every day | ORAL | Status: DC
  Administered 2021-05-06: 300 mg via ORAL
  Filled 2021-05-06: qty 2

## 2021-05-06 MED ORDER — SODIUM CHLORIDE 0.9 % IV SOLN
1.0000 g | Freq: Once | INTRAVENOUS | Status: AC
  Administered 2021-05-06: 1 g via INTRAVENOUS
  Filled 2021-05-06: qty 10

## 2021-05-06 MED ORDER — HYDROCODONE-ACETAMINOPHEN 5-325 MG PO TABS: 1.0000 | ORAL_TABLET | ORAL | 0 refills | Status: DC | PRN

## 2021-05-06 NOTE — Discharge Instructions (Signed)
You have a urinary tract infection that has likely travel to your kidney.  It is important that you take the antibiotic as directed.  Also you have fluid around your ovaries that is worse on the right.  I recommend that you [facility] with [facility].  Also, your blood pressure today has been very elevated.  You have been given medications to lower your pressure.  Please keep your appointment with your primary care provider for tomorrow as scheduled as you will need to have your blood pressure reevaluated. ?

## 2021-05-06 NOTE — ED Provider Notes (Signed)
?[facility] EMERGENCY DEPARTMENT ?Provider Note ? ? ?CSN: [id number] ?Arrival date & time: 05/06/21  1041 ? ?  ? ?History ? ?Chief Complaint  ?Patient presents with  ? Flank Pain  ? ? ?[name] is a 40 y.o. female. ? ? ?Flank Pain ?Associated symptoms include abdominal pain.  ? ?  ? ? ?[name] is a 41 y.o. female who presents to the Emergency Department complaining of left flank pain onset yesterday.  She describes pain to the flank area that radiates into her left groin.  Has history of kidney stones and states pain feels similar to previous kidney stones.  1 episode of vomiting earlier this morning.  She denies any hematuria or dysuria.  States pain is severe and constant.  She denies any dysuria symptoms, fever, nausea, abnormal vaginal bleeding, vaginal discharge or back pain.  She has been abstinent from intercourse since last June. ? ?Hypertensive with history of same.  States that she took her olmesartan this morning.  Did not take clonidine. ? ?Home Medications ?Prior to Admission medications   ?Medication Sig Start Date End Date Taking? Authorizing Provider  ?cloNIDine (CATAPRES) 0.1 MG tablet Take 0.1 mg by mouth as needed.    [provider]  ?IBU 800 MG tablet TAKE ONE TABLET BY MOUTH EVERY 8 HOURS AS NEEDED 06/05/20   Vickki Hearing, MD  ?olmesartan-hydrochlorothiazide (BENICAR HCT) 40-25 MG tablet Take 1 tablet by mouth daily.     [provider]  ?oxyCODONE-acetaminophen (PERCOCET) 5-325 MG tablet Take 1 tablet by mouth every 4 (four) hours as needed for severe pain. 11/15/20 11/15/21  Sebastian Ache, MD  ?senna-docusate (SENOKOT-S) 8.6-50 MG tablet Take 1 tablet by mouth 2 (two) times daily. While taking strong pain meds to prevent constipation 11/15/20   Sebastian Ache, MD  ?sulfamethoxazole-trimethoprim (BACTRIM DS) 800-160 MG tablet Take 1 tablet by mouth 2 (two) times daily. X 3 days to prevent infection with tethered stent. 11/15/20   Sebastian Ache, MD  ?    ? ?Allergies    ?Adhesive [tape]   ? ?Review of Systems   ?Review of Systems  ?HENT:  Negative for trouble swallowing.   ?Gastrointestinal:  Positive for abdominal pain, nausea and vomiting.  ?Genitourinary:  Positive for flank pain. Negative for difficulty urinating, dysuria and hematuria.  ?All other systems reviewed and are negative. ? ?Physical Exam ?Updated Vital Signs ?BP (!) 196/93   Pulse 71   Temp 98.2 ?F (36.8 ?C)   Resp 20   Ht 5\' 3"  (1.6 m)   Wt 68 kg   SpO2 100%   BMI 26.57 kg/m?  ?Physical Exam ?Vitals and nursing note reviewed. Exam conducted with a chaperone present.  ?Constitutional:   ?   Appearance: She is not toxic-appearing.  ?   Comments: Patient is tearful and holding her left lower abdomen, appears uncomfortable  ?Cardiovascular:  ?   Rate and Rhythm: Normal rate and regular rhythm.  ?   Pulses: Normal pulses.  ?Pulmonary:  ?   Effort: Pulmonary effort is normal.  ?Abdominal:  ?   Tenderness: There is no abdominal tenderness. There is no right CVA tenderness or left CVA tenderness.  ?Genitourinary: ?   Labia:     ?   Right: No tenderness or lesion.     ?   Left: No tenderness or lesion.   ?   Cervix: No cervical motion tenderness or cervical bleeding.  ?   Uterus: Not tender.   ?  Adnexa:     ?   Right: No mass or tenderness.      ?   Left: Tenderness present. No mass.    ?   Comments: No significant cervical motion tenderness on exam.  No external bleeding.  There is tenderness at the left adnexa without palpable masses.  The right adnexa is nontender. ?Musculoskeletal:     ?   General: Normal range of motion.  ?   Right lower leg: No edema.  ?   Left lower leg: No edema.  ?Skin: ?   General: Skin is warm.  ?   Capillary Refill: Capillary refill takes less than 2 seconds.  ?   Findings: No rash.  ?Neurological:  ?   General: No focal deficit present.  ?   Sensory: No sensory deficit.  ?   Motor: No weakness.  ? ? ?ED Results / Procedures / Treatments   ?Labs ?(all labs ordered are  listed, but only abnormal results are displayed) ?Labs Reviewed  ?URINALYSIS, ROUTINE W REFLEX MICROSCOPIC - Abnormal; Notable for the following components:  ?    Result Value  ? APPearance CLOUDY (*)   ? Hgb urine dipstick SMALL (*)   ? Protein, ur 30 (*)   ? Nitrite POSITIVE (*)   ? Leukocytes,Ua LARGE (*)   ? WBC, UA >50 (*)   ? Bacteria, UA MANY (*)   ? All other components within normal limits  ?CBC WITH DIFFERENTIAL/PLATELET - Abnormal; Notable for the following components:  ? WBC 11.7 (*)   ? RBC 5.34 (*)   ? RDW 15.6 (*)   ? Platelets 430 (*)   ? Neutro Abs 8.2 (*)   ? Abs Immature Granulocytes 0.11 (*)   ? All other components within normal limits  ?BASIC METABOLIC PANEL - Abnormal; Notable for the following components:  ? Glucose, Bld 105 (*)   ? BUN 21 (*)   ? Creatinine, Ser 1.46 (*)   ? GFR, Estimated 46 (*)   ? All other components within normal limits  ?URINE CULTURE  ?WET PREP, GENITAL  ?PREGNANCY, URINE  ?GC/CHLAMYDIA PROBE AMP ([place]) NOT AT [facility]  ? ? ?EKG ?None ? ?Radiology ?CT Renal Stone Study ? ?Result Date: 05/06/2021 ?CLINICAL DATA:  Worsening left-sided flank pain and history of renal calculi. EXAM: CT ABDOMEN AND PELVIS WITHOUT CONTRAST TECHNIQUE: Multidetector CT imaging of the abdomen and pelvis was performed following the standard protocol without IV contrast. RADIATION DOSE REDUCTION: This exam was performed according to the departmental dose-optimization program which includes automated exposure control, adjustment of the mA and/or kV according to patient size and/or use of iterative reconstruction technique. COMPARISON:  12/18/2018 FINDINGS: Lower chest: No acute abnormality. Hepatobiliary: No focal liver abnormality is seen. No gallstones, gallbladder wall thickening, or biliary dilatation. Pancreas: Unremarkable. No pancreatic ductal dilatation or surrounding inflammatory changes. Spleen: Normal in size without focal abnormality. Adrenals/Urinary Tract: Normal adrenal glands.  Stable atrophic left kidney with chronic mild hydronephrosis and no evidence ureteral calculus. Less prominent hydronephrosis of the right kidney and interval passage/removal of obstructing proximal ureteral calculus and largest intrarenal calculus. There is some degree of underlying chronic mild hydronephrosis of the right kidney and a small residual lower pole calculus measuring 4 mm. No right-sided ureteral calculus identified. Stable appearance of circumferential wall thickening of a nondistended bladder. Stomach/Bowel: Bowel shows no evidence of obstruction, ileus, inflammation or lesion. Clips are again seen in the right lower quadrant presumably related to prior appendectomy. Vascular/Lymphatic:  No significant vascular findings are present. No enlarged abdominal or pelvic lymph nodes. Reproductive: Cysts of both adnexa again noted which are likely physiologic. There also are tubular cystic areas in both adnexal regions suggestive of some degree of underlying hydrosalpinx. If there is pain localizing to the pelvis or clinical concern for PID, correlation with pelvic ultrasound may be helpful. Other: No abdominal wall hernia or abnormality. No abdominopelvic ascites. Musculoskeletal: No acute or significant osseous findings. IMPRESSION: 1. No acute urinary findings. Stable atrophic left kidney demonstrating chronic mild hydronephrosis. Less prominent hydronephrosis of the right kidney with some degree of underlying mild chronic right hydronephrosis and a nonobstructing calculus in the lower pole of the right kidney. No ureteral calculi are seen bilaterally. Stable wall thickening of the bladder. 2. Bilateral adnexal cysts again noted as well as findings suggestive of underlying bilateral hydrosalpinx. If there is pain localizing to the pelvis or clinical concern for PID, pelvic ultrasound evaluation may be helpful. Electronically Signed   By: Irish Lack M.D.   On: 05/06/2021 13:19  ? ?US PELVIC COMPLETE W  TRANSVAGINAL AND TORSION R/O ? ?Result Date: 05/06/2021 ?CLINICAL DATA:  Left flank pain EXAM: TRANSABDOMINAL AND TRANSVAGINAL ULTRASOUND OF PELVIS DOPPLER ULTRASOUND OF OVARIES TECHNIQUE: Both transabdominal an

## 2021-05-06 NOTE — ED Triage Notes (Signed)
Reports L flank pain with hx of kidneyy stone that started yesterday.  Resp even and unlabored.  Denies seeing blood in urine.  Reports vomiting.  ?

## 2021-05-07 LAB — GC/CHLAMYDIA PROBE AMP (~~LOC~~) NOT AT ARMC
Chlamydia: NEGATIVE
Comment: NEGATIVE
Comment: NORMAL
Neisseria Gonorrhea: NEGATIVE

## 2021-05-07 MED FILL — Hydrocodone-Acetaminophen Tab 5-325 MG: ORAL | Qty: 6 | Status: AC

## 2021-05-10 LAB — URINE CULTURE: Culture: >=100000 — AB

## 2021-05-10 LAB — CARBAPENEM RESISTANCE PANEL
Carba Resistance IMP Gene: NOT DETECTED
Carba Resistance KPC Gene: NOT DETECTED
Carba Resistance NDM Gene: NOT DETECTED
Carba Resistance OXA48 Gene: NOT DETECTED
Carba Resistance VIM Gene: NOT DETECTED

## 2021-05-11 ENCOUNTER — Telehealth: Payer: Self-pay | Admitting: Emergency Medicine

## 2021-05-11 NOTE — Progress Notes (Signed)
ED Antimicrobial Stewardship Positive Culture Follow Up  ? ?[name] is an 41 y.o. female who presented to [facility] on 05/06/2021 with a chief complaint of  ?Chief Complaint  ?Patient presents with  ? Flank Pain  ? ? ?Recent Results (from the past 720 hour(s))  ?Urine Culture     Status: Abnormal  ? Collection Time: 05/06/21 11:36 AM  ? Specimen: Urine, Clean Catch  ?Result Value Ref Range Status  ? Specimen Description   Final  ?  URINE, CLEAN CATCH ?Performed at [facility], [address] [place], [place] [address] ?  ? Special Requests   Final  ?  NONE ?Performed at [facility], [address] [place], [place] [address] ?  ? Culture (A)  Final  ?  >=100,000 COLONIES/mL ESCHERICHIA COLI ?Confirmed Extended Spectrum Beta-Lactamase Producer (ESBL).  In bloodstream infections from ESBL organisms, carbapenems are preferred over piperacillin/tazobactam. They are shown to have a lower risk of mortality. ?  ? Report Status 05/10/2021 FINAL  Final  ? Organism ID, Bacteria ESCHERICHIA COLI (A)  Final  ?    Susceptibility  ? Escherichia coli - MIC*  ?  AMPICILLIN >=32 RESISTANT Resistant   ?  CEFAZOLIN >=64 RESISTANT Resistant   ?  CEFEPIME >=32 RESISTANT Resistant   ?  CEFTRIAXONE >=64 RESISTANT Resistant   ?  CIPROFLOXACIN >=4 RESISTANT Resistant   ?  GENTAMICIN >=16 RESISTANT Resistant   ?  IMIPENEM RESISTANT Resistant   ?  NITROFURANTOIN 64 INTERMEDIATE Intermediate   ?  TRIMETH/SULFA <=20 SENSITIVE Sensitive   ?  AMPICILLIN/SULBACTAM >=32 RESISTANT Resistant   ?  PIP/TAZO 64 INTERMEDIATE Intermediate   ?  * >=100,000 COLONIES/mL ESCHERICHIA COLI  ?Carbapenem Resistance Panel     Status: None  ? Collection Time: 05/06/21 11:36 AM  ?Result Value Ref Range Status  ? Carba Resistance IMP Gene NOT DETECTED NOT DETECTED Final  ? Carba Resistance VIM Gene NOT DETECTED NOT DETECTED Final  ? Carba Resistance NDM Gene NOT DETECTED NOT DETECTED Final  ? Carba Resistance KPC Gene NOT DETECTED NOT DETECTED Final  ?  Carba Resistance OXA48 Gene NOT DETECTED NOT DETECTED Final  ?  Comment: (NOTE) ?Cepheid Carba-R is an FDA-cleared nucleic acid amplification test  ?(NAAT)for the detection and differentiation of genes encoding the  ?most prevalent carbapenemases in bacterial isolate samples. ?Carbapenemase gene identification and implementation of comprehensive  ?infection control measures are recommended by the CDC to prevent the  ?spread of the resistant organisms. ?Performed at [facility], [address], [place], [place] ?[address] ?  ?Wet prep, genital     Status: Abnormal  ? Collection Time: 05/06/21  4:39 PM  ? Specimen: PATH Cytology Cervicovaginal Ancillary Only  ?Result Value Ref Range Status  ? Yeast Wet Prep HPF POC NONE SEEN NONE SEEN Final  ? Trich, Wet Prep NONE SEEN NONE SEEN Final  ? Clue Cells Wet Prep HPF POC PRESENT (A) NONE SEEN Final  ? WBC, Wet Prep HPF POC <10 <10 Final  ? Sperm NONE SEEN  Final  ?  Comment: Performed at [facility], [address], [place], [place] [address]  ? ? ?[x]  Treated with cephalexin, organism resistant to prescribed antimicrobial ? ?New antibiotic prescription: Bactrim: Take 1 DS tablet BID x 14 days (Qty 28; Refills 0) - Recommend f/u with PCP for lab check given possible hyperkalemia w/ Bactrim and ARB therapy. ? ?ED Provider: , PA ? ? ?Aline August, PharmD, BCPS ?05/11/2021  10:58 AM ?ED Clinical Pharmacist -  [phone] ? ? ?

## 2021-05-11 NOTE — Telephone Encounter (Signed)
Post ED Visit - Positive Culture Follow-up: Unsuccessful Patient Follow-up ? ?Culture assessed and recommendations reviewed by: ? ?[]  , Pharm.D. ?[]  Enzo Bi, .D., BCPS AQ-ID ?[]  Celedonio Miyamoto, Pharm.D., BCPS ?[]  [address], Pharm.D., BCPS ?[]  , Garvin Fila.D., BCPS, AAHIVP ?[]  , Pharm.D., BCPS, AAHIVP ?[x]  Georgina Pillion, PharmD ?[]  , PharmD, BCPS ? ?Positive urine culture ? ?[]  Patient discharged without antimicrobial prescription and treatment is now indicated ?[x]  Organism is resistant to prescribed ED discharge antimicrobial ?[]  Patient with positive blood cultures ? ? ?Unable to contact patient by phone, left voicemail to return call, letter will be sent to address on file ? ?Plan: ?Bactrim one DS tab BID for 14 days - Lorin Roemhildt, PA ? ?[address] Z[place]a Klebba ?05/11/2021, 10:52 PM  ?

## 2021-05-13 DIAGNOSIS — N200 Calculus of kidney: Secondary | ICD-10-CM | POA: Insufficient documentation

## 2021-06-24 ENCOUNTER — Encounter (HOSPITAL_BASED_OUTPATIENT_CLINIC_OR_DEPARTMENT_OTHER): Payer: Self-pay | Admitting: Obstetrics and Gynecology

## 2021-06-25 ENCOUNTER — Encounter (HOSPITAL_BASED_OUTPATIENT_CLINIC_OR_DEPARTMENT_OTHER): Payer: Self-pay | Admitting: Obstetrics and Gynecology

## 2021-06-25 ENCOUNTER — Other Ambulatory Visit: Payer: Self-pay

## 2021-06-25 NOTE — Progress Notes (Signed)
Spoke w/ via phone for pre-op interview--- pt ?Lab needs dos----  t&s, istat, urine preg, ekg            ?Lab results------ no ?COVID test -----patient states asymptomatic no test needed ?Arrive at ------- 0530 on 06-30-2021 ?NPO after MN NO Solid Food.  Clear liquids from MN until--- 0430 ?Med rec completed ?Medications to take morning of surgery ----- take bp morning of surgery, take clonidine as directed by pcp ?Diabetic medication ----- n/a ?Patient instructed no nail polish to be worn day of surgery ?Patient instructed to bring photo id and insurance card day of surgery ?Patient aware to have Driver (ride ) / caregiver for 24 hours after surgery -- sig other, aron ?Patient Special Instructions ----- n/a ?Pre-Op special Istructions ----- n/a ?Patient verbalized understanding of instructions that were given at this phone interview. ?Patient denies shortness of breath, chest pain, fever, cough at this phone interview.  ?

## 2021-06-28 NOTE — Anesthesia Preprocedure Evaluation (Addendum)
Anesthesia Evaluation  ?Patient identified by MRN, date of birth, ID band ?Patient awake ? ? ? ?Reviewed: ?Allergy & Precautions, NPO status , Patient's Chart, lab work & pertinent test results ? ?Airway ?Mallampati: II ? ?TM Distance: >3 FB ?Neck ROM: Full ? ? ? Dental ?no notable dental hx. ?(+) Teeth Intact, Dental Advisory Given ?  ?Pulmonary ?Current Smoker and Patient abstained from smoking.,  ?  ?Pulmonary exam normal ?breath sounds clear to auscultation ? ? ? ? ? ? Cardiovascular ?hypertension, Normal cardiovascular exam ?Rhythm:Regular Rate:Normal ? ?5-15 EKG NSR ?  ?Neuro/Psych ?Anxiety   ? GI/Hepatic ?negative GI ROS, Neg liver ROS,   ?Endo/Other  ?negative endocrine ROS ? Renal/GU ?Renal diseaseLab Results ?     Component                Value               Date                 ?     CREATININE               1.30 (H)            06/30/2021          ?     K                        3.4 (L)             06/30/2021           ?     ?Hx of Nephrolithiasis  ? ?  ?Musculoskeletal ? ? Abdominal ?  ?Peds ? Hematology ?Lab Results ?     Component                Value               Date                 ?     WBC                      11.7 (H)            05/06/2021           ?     HGB                      14.3                06/30/2021           ?     HCT                      42.0                06/30/2021           ?     MCV                      84.6                05/06/2021           ?     PLT                      430 (H)  05/06/2021           ?   ?Anesthesia Other Findings ?All: Tape ? Reproductive/Obstetrics ?negative OB ROS ? ?  ? ? ? ? ? ? ? ? ? ? ? ? ? ?  ?  ? ? ? ? ? ? ? ?Anesthesia Physical ?Anesthesia Plan ? ?ASA: 2 ? ?Anesthesia Plan: General  ? ?Post-op Pain Management: Dilaudid IV and Toradol IV (intra-op)*  ? ?Induction: Intravenous ? ?PONV Risk Score and Plan: 3 and Ondansetron, Midazolam and Dexamethasone ? ?Airway Management Planned: Oral ETT ? ?Additional  Equipment: None ? ?Intra-op Plan:  ? ?Post-operative Plan: Extubation in OR ? ?Informed Consent: I have reviewed the patients History and Physical, chart, labs and discussed the procedure including the risks, benefits and alternatives for the proposed anesthesia with the patient or authorized representative who has indicated his/her understanding and acceptance.  ? ? ? ?Dental advisory given ? ?Plan Discussed with:  ? ?Anesthesia Plan Comments:   ? ? ? ? ? ?Anesthesia Quick Evaluation ? ?

## 2021-06-29 NOTE — H&P (Signed)
Gynecology History and Physical ? ? ?[name] is a 41 y.o. female with ongoing pelvic pain secondary to suspected bilateral hydrosalpinx. She presents for scheduled laparoscopic bilateral salpingectomy. ? ?She has a history of renal stones and initially presented to ED on 05/06/21 for suspected renal stone pain. CTAP was notable for bilateral adnexal cysts and bilateral hydrosalpinx. Pelvic US was done and right ovary had 1.7 cm involuting cyst, however "marked right hydrosalpinx" and normal left ovary and tube based on ultrasound findings. A CT was done in 12/2018 and hydrosalpinx appears evident, but is notably larger on recent study. ? ?She reports ongoing dull pain, mostly left sided. She denies a history of vaginal infection, STI. GC/CT was done at ED 05/06/21 and was WNL.  ? ?OB History   ? ? Gravida  ?   ? Para  ?   ? Term  ?   ? Preterm  ?   ? AB  ?   ? Living  ?2  ?  ? ? SAB  ?   ? IAB  ?   ? Ectopic  ?   ? Multiple  ?   ? Live Births  ?   ?   ?  ?  ? ?Past Medical History:  ?Diagnosis Date  ? Anxiety   ? Atrophy of left kidney   ? Depression   ? History of kidney stones   ? Hydrosalpinx   ? Hypertension   ? followed by pcp  ? Nephrolithiasis   ? per CT 05-06-2021  right renal stone nonostructive  ? Pain, female pelvic   ? Urgency of urination   ? ?Past Surgical History:  ?Procedure Laterality Date  ? APPENDECTOMY  age 28  ? ARTHOSCOPIC ROTAOR CUFF REPAIR Right 10/31/2019  ? Procedure: ARTHROSCOPIC ROTATOR CUFF REPAIR;  Surgeon: Vickki Hearing, MD;  Location: [facility];  Service: Orthopedics;  Laterality: Right;  ? BALLOON DILATION Left 01/17/2013  ? Procedure: BALLOON DILATION OF LEFT URETERAL STRICTURE;  Surgeon: Ky Barban, MD;  Location: [facility];  Service: Urology;  Laterality: Left;  ? CYSTOSCOPY W/ URETERAL STENT PLACEMENT Left 01/17/2013  ? Procedure: CYSTOSCOPY WITH LEFT RETROGRADE PYELOGRAM; LEFT URETERAL STENT PLACEMENT;  Surgeon: Ky Barban, MD;  Location: [facility];  Service:  Urology;  Laterality: Left;  ? CYSTOSCOPY WITH RETROGRADE PYELOGRAM, URETEROSCOPY AND STENT PLACEMENT Bilateral 12/23/2018  ? Procedure: CYSTOSCOPY WITH RETROGRADE PYELOGRAM, URETEROSCOPY AND STENT PLACEMENT;  Surgeon: Sebastian Ache, MD;  Location: [facility];  Service: Urology;  Laterality: Bilateral;  90 MINS  ? CYSTOSCOPY WITH RETROGRADE PYELOGRAM, URETEROSCOPY AND STENT PLACEMENT Bilateral 01/06/2019  ? Procedure: CYSTOSCOPY WITH RETROGRADE PYELOGRAM, URETEROSCOPY AND STENT PLACEMENT;  Surgeon: Sebastian Ache, MD;  Location: [facility];  Service: Urology;  Laterality: Bilateral;  90 MINS  ? CYSTOSCOPY WITH RETROGRADE PYELOGRAM, URETEROSCOPY AND STENT PLACEMENT Bilateral 11/15/2020  ? Procedure: CYSTOSCOPY WITH RETROGRADE PYELOGRAM, LEFT DIAGNOSTIC URETEROSCOPY, RIGHT DIAGNOSTIC URETEROSCOPY WITH BIOPSY; RIGHT STENT PLACEMENT;  Surgeon: Sebastian Ache, MD;  Location: [facility];  Service: Urology;  Laterality: Bilateral;  75 MINS  ? CYSTOSCOPY/URETEROSCOPY/HOLMIUM LASER/STENT PLACEMENT  2008;  2010;  2011;  2012  ? FLEXIBLE URETEROSCOPY Left 01/17/2013  ? Procedure: FLEXIBLE LEFT URETEROSCOPY; ATTEMPTED STONE BASKET EXTRACTION;  Surgeon: Ky Barban, MD;  Location: [facility];  Service: Urology;  Laterality: Left;  ? HOLMIUM LASER APPLICATION Bilateral 12/23/2018  ? Procedure: HOLMIUM LASER APPLICATION;  Surgeon: Sebastian Ache, MD;  Location: [facility];  Service: Urology;  Laterality: Bilateral;  ? HOLMIUM LASER APPLICATION Bilateral 01/06/2019  ? Procedure: HOLMIUM LASER APPLICATION;  Surgeon: Sebastian Ache, MD;  Location: [facility];  Service: Urology;  Laterality: Bilateral;  ? PERCUTANEOUS NEPHROSTOLITHOTOMY  06/15/2011  ? @[facility]   ? ?Family History: family history is not on file. ?Social History:  reports that she has been smoking cigars. She has never used smokeless tobacco. She reports current alcohol use. She reports  that she does not use drugs. ? ? ?Review of Systems - Patient denies fever, chills, SOB, CP, N/V/D.  ?History ?  ?Blood pressure (!) 144/81, pulse 74, temperature 98.6 ?F (37 ?C), temperature source Oral, resp. rate 18, height 5\' 3"  (1.6 m), weight 65.1 kg, last menstrual period 06/16/2021, SpO2 100 %. ?Exam ?Physical Exam  ? ?Gen: alert, well appearing, no distress ?Chest: nonlabored breathing ?CV: no peripheral edema ?Abdomen: soft, nontender ?Ext: no evidence of DVT ? ? ? ?Assessment/Plan: ?Admit for scheduled procedure.  ?Discussed minimally invasive approach.  Discussed importance of ovarian retention, but will consider removal if abnormal appearing.  ?Discussed risks of procedure, which include but are not limited to bleeding, infection, damage to nearby organs, incomplete procedure, need to convert to open approach. Discussed recovery and activity precautions.  ? ? ?06/30/2021, 7:18 AM ? ? ? ? ?

## 2021-06-30 ENCOUNTER — Encounter (HOSPITAL_BASED_OUTPATIENT_CLINIC_OR_DEPARTMENT_OTHER)
Admission: RE | Disposition: A | Discharge status: Home / Self Care | Payer: Self-pay | Source: Home / Self Care | Attending: Obstetrics and Gynecology

## 2021-06-30 ENCOUNTER — Encounter (HOSPITAL_BASED_OUTPATIENT_CLINIC_OR_DEPARTMENT_OTHER): Payer: Self-pay | Admitting: Obstetrics and Gynecology

## 2021-06-30 ENCOUNTER — Ambulatory Visit (HOSPITAL_BASED_OUTPATIENT_CLINIC_OR_DEPARTMENT_OTHER): Payer: 59 | Admitting: Anesthesiology

## 2021-06-30 ENCOUNTER — Other Ambulatory Visit: Payer: Self-pay

## 2021-06-30 ENCOUNTER — Ambulatory Visit (HOSPITAL_BASED_OUTPATIENT_CLINIC_OR_DEPARTMENT_OTHER)
Admission: RE | Admit: 2021-06-30 | Discharge: 2021-06-30 | Disposition: A | Discharge status: Home / Self Care | Payer: 59 | Attending: Obstetrics and Gynecology | Admitting: Obstetrics and Gynecology

## 2021-06-30 DIAGNOSIS — Z87442 Personal history of urinary calculi: Secondary | ICD-10-CM | POA: Insufficient documentation

## 2021-06-30 DIAGNOSIS — N736 Female pelvic peritoneal adhesions (postinfective): Secondary | ICD-10-CM | POA: Diagnosis not present

## 2021-06-30 DIAGNOSIS — R102 Pelvic and perineal pain: Secondary | ICD-10-CM | POA: Diagnosis present

## 2021-06-30 DIAGNOSIS — N7011 Chronic salpingitis: Secondary | ICD-10-CM | POA: Diagnosis not present

## 2021-06-30 DIAGNOSIS — F17290 Nicotine dependence, other tobacco product, uncomplicated: Secondary | ICD-10-CM | POA: Insufficient documentation

## 2021-06-30 DIAGNOSIS — Z01818 Encounter for other preprocedural examination: Secondary | ICD-10-CM

## 2021-06-30 HISTORY — DX: Pelvic and perineal pain: R10.2

## 2021-06-30 HISTORY — PX: LAPAROSCOPIC BILATERAL SALPINGECTOMY: SHX5889

## 2021-06-30 HISTORY — DX: Chronic salpingitis: N70.11

## 2021-06-30 HISTORY — DX: Atrophy of kidney (terminal): N26.1

## 2021-06-30 LAB — POCT I-STAT, CHEM 8
BUN: 20 mg/dL (ref 6–20)
BUN: 23 mg/dL — ABNORMAL HIGH (ref 6–20)
Calcium, Ion: 0.83 mmol/L — CL (ref 1.15–1.40)
Calcium, Ion: 1.29 mmol/L (ref 1.15–1.40)
Chloride: 106 mmol/L (ref 98–111)
Chloride: 112 mmol/L — ABNORMAL HIGH (ref 98–111)
Creatinine, Ser: 1.2 mg/dL — ABNORMAL HIGH (ref 0.44–1.00)
Creatinine, Ser: 1.3 mg/dL — ABNORMAL HIGH (ref 0.44–1.00)
Glucose, Bld: 108 mg/dL — ABNORMAL HIGH (ref 70–99)
Glucose, Bld: 111 mg/dL — ABNORMAL HIGH (ref 70–99)
HCT: 41 % (ref 36.0–46.0)
HCT: 42 % (ref 36.0–46.0)
Hemoglobin: 13.9 g/dL (ref 12.0–15.0)
Hemoglobin: 14.3 g/dL (ref 12.0–15.0)
Potassium: 3.3 mmol/L — ABNORMAL LOW (ref 3.5–5.1)
Potassium: 3.4 mmol/L — ABNORMAL LOW (ref 3.5–5.1)
Sodium: 137 mmol/L (ref 135–145)
Sodium: 142 mmol/L (ref 135–145)
TCO2: 20 mmol/L — ABNORMAL LOW (ref 22–32)
TCO2: 25 mmol/L (ref 22–32)

## 2021-06-30 LAB — POCT PREGNANCY, URINE: Preg Test, Ur: NEGATIVE

## 2021-06-30 LAB — TYPE AND SCREEN
ABO/RH(D): A POS
Antibody Screen: NEGATIVE

## 2021-06-30 LAB — ABO/RH: ABO/RH(D): A POS

## 2021-06-30 SURGERY — SALPINGECTOMY, BILATERAL, LAPAROSCOPIC
Anesthesia: General | Site: Abdomen | Laterality: Bilateral

## 2021-06-30 MED ORDER — ACETAMINOPHEN 325 MG PO TABS
650.0000 mg | ORAL_TABLET | Freq: Four times a day (QID) | ORAL | 0 refills | Status: DC | PRN
Start: 2021-06-30 — End: 2021-09-22

## 2021-06-30 MED ORDER — 0.9 % SODIUM CHLORIDE (POUR BTL) OPTIME
TOPICAL | Status: DC | PRN
  Administered 2021-06-30: 1000 mL

## 2021-06-30 MED ORDER — OXYCODONE HCL 5 MG PO TABS: 5.0000 mg | ORAL_TABLET | Freq: Once | ORAL | Status: DC | PRN

## 2021-06-30 MED ORDER — BUPIVACAINE HCL (PF) 0.5 % IJ SOLN
INTRAMUSCULAR | Status: DC | PRN
  Administered 2021-06-30: 10 mL

## 2021-06-30 MED ORDER — OXYCODONE HCL 5 MG/5ML PO SOLN: 5.0000 mg | Freq: Once | ORAL | Status: DC | PRN

## 2021-06-30 MED ORDER — SUGAMMADEX SODIUM 200 MG/2ML IV SOLN
INTRAVENOUS | Status: DC | PRN
  Administered 2021-06-30: 200 mg via INTRAVENOUS

## 2021-06-30 MED ORDER — DEXAMETHASONE SODIUM PHOSPHATE 10 MG/ML IJ SOLN
INTRAMUSCULAR | Status: DC | PRN
Start: 2021-06-30 — End: 2021-06-30
  Administered 2021-06-30: 10 mg via INTRAVENOUS

## 2021-06-30 MED ORDER — MIDAZOLAM HCL 5 MG/5ML IJ SOLN
INTRAMUSCULAR | Status: DC | PRN
  Administered 2021-06-30: 2 mg via INTRAVENOUS

## 2021-06-30 MED ORDER — KETOROLAC TROMETHAMINE 30 MG/ML IJ SOLN
INTRAMUSCULAR | Status: AC
  Filled 2021-06-30: qty 1

## 2021-06-30 MED ORDER — SODIUM CHLORIDE 0.9 % IR SOLN
Status: DC | PRN
  Administered 2021-06-30: 500 mL

## 2021-06-30 MED ORDER — ROCURONIUM BROMIDE 10 MG/ML (PF) SYRINGE
PREFILLED_SYRINGE | INTRAVENOUS | Status: DC | PRN
Start: 2021-06-30 — End: 2021-06-30
  Administered 2021-06-30: 10 mg via INTRAVENOUS
  Administered 2021-06-30: 50 mg via INTRAVENOUS

## 2021-06-30 MED ORDER — DEXAMETHASONE SODIUM PHOSPHATE 10 MG/ML IJ SOLN
INTRAMUSCULAR | Status: AC
  Filled 2021-06-30: qty 1

## 2021-06-30 MED ORDER — DEXMEDETOMIDINE (PRECEDEX) IN NS 20 MCG/5ML (4 MCG/ML) IV SYRINGE
PREFILLED_SYRINGE | INTRAVENOUS | Status: DC | PRN
  Administered 2021-06-30: 8 ug via INTRAVENOUS

## 2021-06-30 MED ORDER — OXYCODONE HCL 5 MG PO TABS: 5.0000 mg | ORAL_TABLET | ORAL | 0 refills | Status: DC | PRN

## 2021-06-30 MED ORDER — LIDOCAINE 2% (20 MG/ML) 5 ML SYRINGE
INTRAMUSCULAR | Status: DC | PRN
Start: 2021-06-30 — End: 2021-06-30
  Administered 2021-06-30: 100 mg via INTRAVENOUS

## 2021-06-30 MED ORDER — LACTATED RINGERS IV SOLN: INTRAVENOUS | Status: DC

## 2021-06-30 MED ORDER — ONDANSETRON HCL 4 MG/2ML IJ SOLN: 4.0000 mg | Freq: Once | INTRAMUSCULAR | Status: DC | PRN

## 2021-06-30 MED ORDER — MIDAZOLAM HCL 2 MG/2ML IJ SOLN
INTRAMUSCULAR | Status: AC
  Filled 2021-06-30: qty 2

## 2021-06-30 MED ORDER — HYDROMORPHONE HCL 1 MG/ML IJ SOLN
0.2500 mg | INTRAMUSCULAR | Status: DC | PRN
  Administered 2021-06-30: 0.25 mg via INTRAVENOUS

## 2021-06-30 MED ORDER — PROPOFOL 10 MG/ML IV BOLUS
INTRAVENOUS | Status: DC | PRN
  Administered 2021-06-30: 110 mg via INTRAVENOUS

## 2021-06-30 MED ORDER — ROCURONIUM BROMIDE 10 MG/ML (PF) SYRINGE
PREFILLED_SYRINGE | INTRAVENOUS | Status: AC
  Filled 2021-06-30: qty 10

## 2021-06-30 MED ORDER — KETOROLAC TROMETHAMINE 30 MG/ML IJ SOLN
INTRAMUSCULAR | Status: DC | PRN
  Administered 2021-06-30: 15 mg via INTRAVENOUS

## 2021-06-30 MED ORDER — LIDOCAINE HCL (PF) 2 % IJ SOLN
INTRAMUSCULAR | Status: AC
  Filled 2021-06-30: qty 5

## 2021-06-30 MED ORDER — HYDROMORPHONE HCL 1 MG/ML IJ SOLN
INTRAMUSCULAR | Status: AC
  Filled 2021-06-30: qty 1

## 2021-06-30 MED ORDER — FENTANYL CITRATE (PF) 250 MCG/5ML IJ SOLN
INTRAMUSCULAR | Status: AC
  Filled 2021-06-30: qty 5

## 2021-06-30 MED ORDER — SCOPOLAMINE 1 MG/3DAYS TD PT72
MEDICATED_PATCH | TRANSDERMAL | Status: AC
  Filled 2021-06-30: qty 1

## 2021-06-30 MED ORDER — FENTANYL CITRATE (PF) 100 MCG/2ML IJ SOLN
INTRAMUSCULAR | Status: DC | PRN
  Administered 2021-06-30: 50 ug via INTRAVENOUS
  Administered 2021-06-30 (×2): 25 ug via INTRAVENOUS
  Administered 2021-06-30: 100 ug via INTRAVENOUS
  Administered 2021-06-30: 50 ug via INTRAVENOUS

## 2021-06-30 MED ORDER — ONDANSETRON HCL 4 MG/2ML IJ SOLN
INTRAMUSCULAR | Status: AC
  Filled 2021-06-30: qty 2

## 2021-06-30 MED ORDER — SCOPOLAMINE 1 MG/3DAYS TD PT72
1.0000 | MEDICATED_PATCH | TRANSDERMAL | Status: DC
  Administered 2021-06-30: 1.5 mg via TRANSDERMAL

## 2021-06-30 MED ORDER — KETOROLAC TROMETHAMINE 30 MG/ML IJ SOLN: 30.0000 mg | Freq: Once | INTRAMUSCULAR | Status: DC | PRN

## 2021-06-30 MED ORDER — POVIDONE-IODINE 10 % EX SWAB: 2.0000 "application " | Freq: Once | CUTANEOUS | Status: DC

## 2021-06-30 MED ORDER — PROPOFOL 10 MG/ML IV BOLUS
INTRAVENOUS | Status: AC
  Filled 2021-06-30: qty 20

## 2021-06-30 MED ORDER — ONDANSETRON HCL 4 MG/2ML IJ SOLN
INTRAMUSCULAR | Status: DC | PRN
Start: 2021-06-30 — End: 2021-06-30
  Administered 2021-06-30: 4 mg via INTRAVENOUS

## 2021-06-30 SURGICAL SUPPLY — 40 items
ADH SKN CLS APL DERMABOND .7 (GAUZE/BANDAGES/DRESSINGS) ×2
BAG RETRIEVAL 10 (BASKET)
BARRIER ADHS 3X4 INTERCEED (GAUZE/BANDAGES/DRESSINGS) IMPLANT
BRR ADH 4X3 ABS CNTRL BYND (GAUZE/BANDAGES/DRESSINGS)
CABLE HIGH FREQUENCY MONO STRZ (ELECTRODE) IMPLANT
CATH ROBINSON RED A/P 16FR (CATHETERS) ×2 IMPLANT
COVER MAYO STAND STRL (DRAPES) ×4 IMPLANT
DERMABOND ADVANCED (GAUZE/BANDAGES/DRESSINGS) ×1
DERMABOND ADVANCED .7 DNX12 (GAUZE/BANDAGES/DRESSINGS) ×3 IMPLANT
DRSG OPSITE POSTOP 3X4 (GAUZE/BANDAGES/DRESSINGS) IMPLANT
DURAPREP 26ML APPLICATOR (WOUND CARE) ×4 IMPLANT
GAUZE 4X4 16PLY ~~LOC~~+RFID DBL (SPONGE) ×6 IMPLANT
GLOVE BIOGEL PI IND STRL 7.0 (GLOVE) ×3 IMPLANT
GLOVE BIOGEL PI INDICATOR 7.0 (GLOVE) ×1
GLOVE ECLIPSE 7.0 STRL STRAW (GLOVE) ×8 IMPLANT
GOWN STRL REUS W/ TWL LRG LVL3 (GOWN DISPOSABLE) ×6 IMPLANT
GOWN STRL REUS W/TWL LRG LVL3 (GOWN DISPOSABLE) ×6
IV NS 1000ML (IV SOLUTION) ×3
IV NS 1000ML BAXH (IV SOLUTION) ×1 IMPLANT
KIT TURNOVER CYSTO (KITS) ×4 IMPLANT
LIGASURE BLUNT TIP 5 LONG 44CM (ELECTROSURGICAL) ×2 IMPLANT
LIGASURE VESSEL 5MM BLUNT TIP (ELECTROSURGICAL) IMPLANT
NS IRRIG 1000ML POUR BTL (IV SOLUTION) ×4 IMPLANT
PACK LAPAROSCOPY BASIN (CUSTOM PROCEDURE TRAY) ×4 IMPLANT
PACK TRENDGUARD 450 HYBRID PRO (MISCELLANEOUS) ×1 IMPLANT
PAD OB MATERNITY 4.3X12.25 (PERSONAL CARE ITEMS) ×4 IMPLANT
PAD PREP 24X48 CUFFED NSTRL (MISCELLANEOUS) ×4 IMPLANT
PROTECTOR NERVE ULNAR (MISCELLANEOUS) ×6 IMPLANT
SET IRRIG TUBING LAPAROSCOPIC (IRRIGATION / IRRIGATOR) ×2 IMPLANT
SET TUBE SMOKE EVAC HIGH FLOW (TUBING) ×4 IMPLANT
SOL PREP POV-IOD 4OZ 10% (MISCELLANEOUS) ×2 IMPLANT
SUT MON AB 4-0 PS1 27 (SUTURE) ×4 IMPLANT
SUT VICRYL 0 UR6 27IN ABS (SUTURE) ×4 IMPLANT
SYS BAG RETRIEVAL 10MM (BASKET)
SYSTEM BAG RETRIEVAL 10MM (BASKET) IMPLANT
TOWEL OR 17X26 10 PK STRL BLUE (TOWEL DISPOSABLE) ×4 IMPLANT
TRENDGUARD 450 HYBRID PRO PACK (MISCELLANEOUS) ×3
TROCAR XCEL NON-BLD 11X100MML (ENDOMECHANICALS) ×4 IMPLANT
TROCAR XCEL NON-BLD 5MMX100MML (ENDOMECHANICALS) ×8 IMPLANT
WARMER LAPAROSCOPE (MISCELLANEOUS) ×4 IMPLANT

## 2021-06-30 NOTE — Op Note (Signed)
Operative Note ? ?PREOPERATIVE DIAGNOSES: ?1. Pelvic Pain ?2. Suspected Hydrosalpinx ? ?POSTOPERATIVE DIAGNOSES: ?1. Pelvic Pain ?2. Extensive pelvic adhesions ?3. R hydrosalpinx with adhesions to left fallopian tube and ovary ? ?PROCEDURE PERFORMED: Laparoscopic bilateral salpingectomy with extensive lysis of adhesions ? ?SURGEON: Dr. Nilda Simmer ? ?ANESTHESIA: General  ? ?ESTIMATED BLOOD LOSS: 10 cc. ? ?Bladder emptied prior to procedure ? ?COMPLICATIONS: None  ? ?TUBES: None. ? ?DRAINS: None.  ? ?PATHOLOGY: Bilateral fallopian tubes were sent to pathology for review. ? ?FINDINGS: On exam, under anesthesia, normal appearing vulva and vagina, a normal sized uterus  ? ?Operative findings demonstrated sized uterus. Extensive pelvic adhesions were noted, particularly to the anterior abdominal wall.  Bowel involvement was initially inspected, however after careful dissection throughout the case this was determined to be dilated fallopian tube.  Omental adhesions noted.  Filmy adhesions throughout both adnexa and to uterine fundus were noted.  No bladder involvement of adhesions. Appendix was surgically absent and liver/gallbladder were normal appearing. Many pictures were taken.  ? ? ?Procedure: ?A general anesthesia was induced and the patient was placed in the dirsal lithotomy position. The abdomen, perineum, and vagina were prepped and draped in the usual fashion. After the initial preparation, the procedure commenced at the vagina. With a speculum in place to visualize the cervix, the cervix was grasped with single tooth tenaculum and acord cannula was placed and secured. Attention was then turned to the abdomen.  ?  ?Following infiltration with Lidocaine, an infraumbilical incision was made and 11 mm trocar was placed under direct visualization. ? ?Visualization of the peritoneal cavity was then obtained and a brief inspection did not reveal any signs of complications from entry.  ? ?Adhesions were noted  involving the omentum, anterior abdominal wall, andexae, and possibly bowel (this turned out to be dilated fallopian tube after gentle dissection). ? ?Two 5 mm port sites were placed under direct visualization.  ? ?A small opening in the adhesions was used to inspect the right adnexae, comfirming hydrosalpinx and normal ovary.  The tube was dilated and tortuous and involved in the adhesion mass described above.  The tube was confirmed by following to fimbriae, and then mobilized by transecting the tubo-ovarian ligament with ligasure.  The dilated tube was then drained, noting serous fluid. This aided in visualization and manipulation.  Traction was placed on the drained tube, then with a suction tip, blunt dissection gradually released the tube and omentum from adhesions.  Upon initial entry, there was concern that bowel was involved in this adhesion, and therefore dissection was avoided in this area.  As the tube was gradually freed it was apparent that this was not bowel involvement but a portion of the tube.  ? ?Once free from the omentum and abdominal wall, the tube was then noted to be adherent to the left tubo-ovarian complex.  The tubes remained adherent to one another, and after the ovary was free from filmy adhesions, a left salpingectomy was completed.  These were pulled though the 11 mm port in entirety.  ? ?Irritation was then completed and hemostasis noted at sites of salpingectomy and adhesiolysis. Ureters were visualized and far from any surgical sites.  No bladder injury noted.  ? ?The 5 mm ports were then removed under direct visualization and the 11 mm port removed and abdomen exsufflated. ? ?Fascia was closed with 0' vicryl and skin closed with 4'0 vicryl. The sponge and lap counts were correct times 2 at this time.  ? ?The  patient's procedure was terminated. We then awakened her. She was sent to the Recovery Room in good condition.   ? ?Nilda Simmer MD ?

## 2021-06-30 NOTE — Discharge Instructions (Signed)
NO IBUPROFEN PRODUCTS UNTIL 3:10 PM TODAY. (IBUPROFEN, ADVIL, MOTRIN, ALEVE, NAPROXEN, CELEBREX) ? ? ? ?Post Anesthesia Home Care Instructions ? ?Activity: ?Get plenty of rest for the remainder of the day. A responsible individual must stay with you for 24 hours following the procedure.  ?For the next 24 hours, DO NOT: ?-Drive a car ?-Advertising copywriter ?-Drink alcoholic beverages ?-Take any medication unless instructed by your physician ?-Make any legal decisions or sign important papers. ? ?Meals: ?Start with liquid foods such as gelatin or soup. Progress to regular foods as tolerated. Avoid greasy, spicy, heavy foods. If nausea and/or vomiting occur, drink only clear liquids until the nausea and/or vomiting subsides. Call your physician if vomiting continues. ? ?Special Instructions/Symptoms: ?Your throat may feel dry or sore from the anesthesia or the breathing tube placed in your throat during surgery. If this causes discomfort, gargle with warm salt water. The discomfort should disappear within 24 hours. ? ?If you had a scopolamine patch placed behind your ear for the management of post- operative nausea and/or vomiting: ? ?1. The medication in the patch is effective for 72 hours, after which it should be removed.  Wrap patch in a tissue and discard in the trash. Wash hands thoroughly with soap and water. ?2. You may remove the patch earlier than 72 hours if you experience unpleasant side effects which may include dry mouth, dizziness or visual disturbances. ?3. Avoid touching the patch. Wash your hands with soap and water after contact with the patch. ? ? ? ? ?    ?

## 2021-06-30 NOTE — Brief Op Note (Signed)
06/30/2021 ? ?9:18 AM ? ?PATIENT:  [name]  41 y.o. female ? ?PRE-OPERATIVE DIAGNOSIS:  PELVIC PAIN ? ?POST-OPERATIVE DIAGNOSIS:  Same, extensive pelvic adhesions, hydrosalpinx ? ?PROCEDURE:  Procedure(s): ?LAPAROSCOPIC BILATERAL SALPINGECTOMY; LYSIS OF ADHESIONS (Bilateral) ? ?SURGEON:  Surgeon(s) and Role: ?   Lyn Henri, MD - Primary ? ?PHYSICIAN ASSISTANT:  ? ?ASSISTANTS: none  ? ?ANESTHESIA:   Per anesthesia record ? ?EBL:  10 mL  ? ?BLOOD ADMINISTERED:none ? ?DRAINS: none  ? ?LOCAL MEDICATIONS USED:  LIDOCAINE  ? ?SPECIMEN:  bilateral fallopian tubes ? ?DISPOSITION OF SPECIMEN:  PATHOLOGY ? ?COUNTS:  YES ? ?TOURNIQUET:  * No tourniquets in log * ? ?DICTATION: .Note written in EPIC ? ?PLAN OF CARE: Discharge to home after PACU ? ?PATIENT DISPOSITION:  PACU - hemodynamically stable. ?  ?Delay start of Pharmacological VTE agent (>24hrs) due to surgical blood loss or risk of bleeding: no ? ?

## 2021-06-30 NOTE — Anesthesia Procedure Notes (Signed)
Procedure Name: Intubation ?Date/Time: 06/30/2021 7:34 AM ?Performed by: Rogers Blocker, CRNA ?Pre-anesthesia Checklist: Patient identified, Emergency Drugs available, Suction available and Patient being monitored ?Patient Re-evaluated:Patient Re-evaluated prior to induction ?Oxygen Delivery Method: Circle System Utilized ?Preoxygenation: Pre-oxygenation with 100% oxygen ?Induction Type: IV induction ?Ventilation: Mask ventilation without difficulty ?Laryngoscope Size: 3 and Mac ?Grade View: Grade I ?Tube type: Oral ?Tube size: 7.0 mm ?Number of attempts: 1 ?Airway Equipment and Method: Stylet ?Placement Confirmation: ETT inserted through vocal cords under direct vision, positive ETCO2 and breath sounds checked- equal and bilateral ?Secured at: 22 cm ?Tube secured with: Tape ?Dental Injury: Teeth and Oropharynx as per pre-operative assessment  ? ? ? ? ?

## 2021-06-30 NOTE — Transfer of Care (Signed)
Immediate Anesthesia Transfer of Care Note ? ?Patient: [name] ? ?Procedure(s) Performed: LAPAROSCOPIC BILATERAL SALPINGECTOMY; LYSIS OF ADHESIONS (Bilateral: Abdomen) ? ?Patient Location: PACU ? ?Anesthesia Type:General ? ?Level of Consciousness: awake, alert , oriented and patient cooperative ? ?Airway & Oxygen Therapy: Patient Spontanous Breathing ? ?Post-op Assessment: Report given to RN and Post -op Vital signs reviewed and stable ? ?Post vital signs: Reviewed and stable ? ?Last Vitals:  ?Vitals Value Taken Time  ?BP 172/99 06/30/21 0930  ?Temp    ?Pulse 86 06/30/21 0931  ?Resp 17 06/30/21 0932  ?SpO2 98 % 06/30/21 0931  ?Vitals shown include unvalidated device data. ? ?Last Pain:  ?Vitals:  ? 06/30/21 0542  ?TempSrc: Oral  ?   ? ?Patients Stated Pain Goal: 4 (06/30/21 0542) ? ?Complications: No notable events documented. ?

## 2021-06-30 NOTE — Anesthesia Postprocedure Evaluation (Signed)
Anesthesia Post Note ? ?Patient: [name] ? ?Procedure(s) Performed: LAPAROSCOPIC BILATERAL SALPINGECTOMY; LYSIS OF ADHESIONS (Bilateral: Abdomen) ? ?  ? ?Patient location during evaluation: PACU ?Anesthesia Type: General ?Level of consciousness: awake and alert ?Pain management: pain level controlled ?Vital Signs Assessment: post-procedure vital signs reviewed and stable ?Respiratory status: spontaneous breathing, nonlabored ventilation, respiratory function stable and patient connected to nasal cannula oxygen ?Cardiovascular status: blood pressure returned to baseline and stable ?Postop Assessment: no apparent nausea or vomiting ?Anesthetic complications: no ? ? ?No notable events documented. ? ?Last Vitals:  ?Vitals:  ? 06/30/21 1045 06/30/21 1110  ?BP: (!) 152/85 138/76  ?Pulse: 77 72  ?Resp: 15 16  ?Temp:  37 ?C  ?SpO2: 97% 99%  ?  ?Last Pain:  ?Vitals:  ? 06/30/21 1110  ?TempSrc:   ?PainSc: 3   ? ? ?  ?  ?  ?  ?  ?  ? ?Trevor Iha ? ? ? ? ?

## 2021-07-01 ENCOUNTER — Encounter (HOSPITAL_BASED_OUTPATIENT_CLINIC_OR_DEPARTMENT_OTHER): Payer: Self-pay | Admitting: Obstetrics and Gynecology

## 2021-07-01 LAB — SURGICAL PATHOLOGY

## 2021-09-15 ENCOUNTER — Emergency Department (HOSPITAL_COMMUNITY): Payer: 59

## 2021-09-15 ENCOUNTER — Encounter (HOSPITAL_COMMUNITY): Payer: Self-pay | Admitting: *Deleted

## 2021-09-15 ENCOUNTER — Other Ambulatory Visit: Payer: Self-pay

## 2021-09-15 ENCOUNTER — Inpatient Hospital Stay (HOSPITAL_COMMUNITY)
Admission: EM | Admit: 2021-09-15 | Discharge: 2021-09-18 | DRG: 281 | Disposition: A | Discharge status: Home / Self Care | Payer: 59 | Attending: Internal Medicine | Admitting: Internal Medicine

## 2021-09-15 DIAGNOSIS — E785 Hyperlipidemia, unspecified: Secondary | ICD-10-CM | POA: Diagnosis present

## 2021-09-15 DIAGNOSIS — Z79899 Other long term (current) drug therapy: Secondary | ICD-10-CM

## 2021-09-15 DIAGNOSIS — N1832 Chronic kidney disease, stage 3b: Secondary | ICD-10-CM | POA: Diagnosis present

## 2021-09-15 DIAGNOSIS — Z9109 Other allergy status, other than to drugs and biological substances: Secondary | ICD-10-CM

## 2021-09-15 DIAGNOSIS — N136 Pyonephrosis: Secondary | ICD-10-CM | POA: Diagnosis present

## 2021-09-15 DIAGNOSIS — Z8249 Family history of ischemic heart disease and other diseases of the circulatory system: Secondary | ICD-10-CM | POA: Diagnosis not present

## 2021-09-15 DIAGNOSIS — F32A Depression, unspecified: Secondary | ICD-10-CM | POA: Diagnosis present

## 2021-09-15 DIAGNOSIS — N261 Atrophy of kidney (terminal): Secondary | ICD-10-CM | POA: Diagnosis present

## 2021-09-15 DIAGNOSIS — Z9049 Acquired absence of other specified parts of digestive tract: Secondary | ICD-10-CM | POA: Diagnosis not present

## 2021-09-15 DIAGNOSIS — I10 Essential (primary) hypertension: Secondary | ICD-10-CM | POA: Diagnosis present

## 2021-09-15 DIAGNOSIS — E860 Dehydration: Secondary | ICD-10-CM | POA: Diagnosis present

## 2021-09-15 DIAGNOSIS — Z87442 Personal history of urinary calculi: Secondary | ICD-10-CM

## 2021-09-15 DIAGNOSIS — R778 Other specified abnormalities of plasma proteins: Secondary | ICD-10-CM

## 2021-09-15 DIAGNOSIS — N3000 Acute cystitis without hematuria: Secondary | ICD-10-CM | POA: Diagnosis not present

## 2021-09-15 DIAGNOSIS — N7011 Chronic salpingitis: Secondary | ICD-10-CM | POA: Diagnosis present

## 2021-09-15 DIAGNOSIS — F419 Anxiety disorder, unspecified: Secondary | ICD-10-CM | POA: Diagnosis present

## 2021-09-15 DIAGNOSIS — N179 Acute kidney failure, unspecified: Secondary | ICD-10-CM | POA: Diagnosis present

## 2021-09-15 DIAGNOSIS — N390 Urinary tract infection, site not specified: Secondary | ICD-10-CM | POA: Diagnosis present

## 2021-09-15 DIAGNOSIS — E876 Hypokalemia: Secondary | ICD-10-CM | POA: Diagnosis present

## 2021-09-15 DIAGNOSIS — J9811 Atelectasis: Secondary | ICD-10-CM | POA: Diagnosis present

## 2021-09-15 DIAGNOSIS — I5032 Chronic diastolic (congestive) heart failure: Secondary | ICD-10-CM | POA: Diagnosis present

## 2021-09-15 DIAGNOSIS — N12 Tubulo-interstitial nephritis, not specified as acute or chronic: Secondary | ICD-10-CM | POA: Diagnosis not present

## 2021-09-15 DIAGNOSIS — I130 Hypertensive heart and chronic kidney disease with heart failure and stage 1 through stage 4 chronic kidney disease, or unspecified chronic kidney disease: Secondary | ICD-10-CM | POA: Diagnosis present

## 2021-09-15 DIAGNOSIS — R0600 Dyspnea, unspecified: Secondary | ICD-10-CM | POA: Diagnosis present

## 2021-09-15 DIAGNOSIS — R7989 Other specified abnormal findings of blood chemistry: Secondary | ICD-10-CM

## 2021-09-15 DIAGNOSIS — I214 Non-ST elevation (NSTEMI) myocardial infarction: Secondary | ICD-10-CM | POA: Diagnosis present

## 2021-09-15 DIAGNOSIS — F17290 Nicotine dependence, other tobacco product, uncomplicated: Secondary | ICD-10-CM | POA: Diagnosis present

## 2021-09-15 LAB — URINALYSIS, ROUTINE W REFLEX MICROSCOPIC
Bilirubin Urine: NEGATIVE
Glucose, UA: NEGATIVE mg/dL
Ketones, ur: NEGATIVE mg/dL
Nitrite: POSITIVE — AB
Protein, ur: NEGATIVE mg/dL
Specific Gravity, Urine: 1.011 (ref 1.005–1.030)
Trans Epithel, UA: <1
pH: 6 (ref 5.0–8.0)

## 2021-09-15 LAB — CBC WITH DIFFERENTIAL/PLATELET
Abs Immature Granulocytes: 0.11 10*3/uL — ABNORMAL HIGH (ref 0.00–0.07)
Basophils Absolute: 0.1 10*3/uL (ref 0.0–0.1)
Basophils Relative: 0 %
Eosinophils Absolute: 0.1 10*3/uL (ref 0.0–0.5)
Eosinophils Relative: 1 %
HCT: 43.4 % (ref 36.0–46.0)
Hemoglobin: 14.3 g/dL (ref 12.0–15.0)
Immature Granulocytes: 1 %
Lymphocytes Relative: 22 %
Lymphs Abs: 3.7 10*3/uL (ref 0.7–4.0)
MCH: 27.5 pg (ref 26.0–34.0)
MCHC: 32.9 g/dL (ref 30.0–36.0)
MCV: 83.5 fL (ref 80.0–100.0)
Monocytes Absolute: 0.9 10*3/uL (ref 0.1–1.0)
Monocytes Relative: 5 %
Neutro Abs: 12.2 10*3/uL — ABNORMAL HIGH (ref 1.7–7.7)
Neutrophils Relative %: 71 %
Platelets: 392 10*3/uL (ref 150–400)
RBC: 5.2 MIL/uL — ABNORMAL HIGH (ref 3.87–5.11)
RDW: 13.7 % (ref 11.5–15.5)
WBC: 17.1 10*3/uL — ABNORMAL HIGH (ref 4.0–10.5)
nRBC: 0 % (ref 0.0–0.2)

## 2021-09-15 LAB — I-STAT CHEM 8, ED
BUN: 38 mg/dL — ABNORMAL HIGH (ref 6–20)
Calcium, Ion: 1.2 mmol/L (ref 1.15–1.40)
Chloride: 106 mmol/L (ref 98–111)
Creatinine, Ser: 2.5 mg/dL — ABNORMAL HIGH (ref 0.44–1.00)
Glucose, Bld: 129 mg/dL — ABNORMAL HIGH (ref 70–99)
HCT: 46 % (ref 36.0–46.0)
Hemoglobin: 15.6 g/dL — ABNORMAL HIGH (ref 12.0–15.0)
Potassium: 2.8 mmol/L — ABNORMAL LOW (ref 3.5–5.1)
Sodium: 140 mmol/L (ref 135–145)
TCO2: 22 mmol/L (ref 22–32)

## 2021-09-15 LAB — PROTIME-INR
INR: 1 (ref 0.8–1.2)
Prothrombin Time: 13 seconds (ref 11.4–15.2)

## 2021-09-15 LAB — COMPREHENSIVE METABOLIC PANEL
ALT: 34 U/L (ref 0–44)
AST: 41 U/L (ref 15–41)
Albumin: 3.8 g/dL (ref 3.5–5.0)
Alkaline Phosphatase: 88 U/L (ref 38–126)
Anion gap: 9 (ref 5–15)
BUN: 40 mg/dL — ABNORMAL HIGH (ref 6–20)
CO2: 21 mmol/L — ABNORMAL LOW (ref 22–32)
Calcium: 9.3 mg/dL (ref 8.9–10.3)
Chloride: 107 mmol/L (ref 98–111)
Creatinine, Ser: 2.29 mg/dL — ABNORMAL HIGH (ref 0.44–1.00)
GFR, Estimated: 27 mL/min — ABNORMAL LOW (ref >60)
Glucose, Bld: 128 mg/dL — ABNORMAL HIGH (ref 70–99)
Potassium: 2.9 mmol/L — ABNORMAL LOW (ref 3.5–5.1)
Sodium: 137 mmol/L (ref 135–145)
Total Bilirubin: 0.4 mg/dL (ref 0.3–1.2)
Total Protein: 8.2 g/dL — ABNORMAL HIGH (ref 6.5–8.1)

## 2021-09-15 LAB — CK TOTAL AND CKMB (NOT AT ARMC)
CK, MB: 43.4 ng/mL — ABNORMAL HIGH (ref 0.5–5.0)
Relative Index: 8.5 — ABNORMAL HIGH (ref 0.0–2.5)
Total CK: 513 U/L — ABNORMAL HIGH (ref 38–234)

## 2021-09-15 LAB — RAPID URINE DRUG SCREEN, HOSP PERFORMED
Amphetamines: NOT DETECTED
Barbiturates: NOT DETECTED
Benzodiazepines: NOT DETECTED
Cocaine: NOT DETECTED
Opiates: NOT DETECTED
Tetrahydrocannabinol: NOT DETECTED

## 2021-09-15 LAB — MAGNESIUM: Magnesium: 2.4 mg/dL (ref 1.7–2.4)

## 2021-09-15 LAB — TROPONIN I (HIGH SENSITIVITY)
Troponin I (High Sensitivity): 253 ng/L (ref <18)
Troponin I (High Sensitivity): 419 ng/L (ref <18)
Troponin I (High Sensitivity): 1197 ng/L (ref <18)

## 2021-09-15 LAB — LACTIC ACID, PLASMA
Lactic Acid, Venous: 1.2 mmol/L (ref 0.5–1.9)
Lactic Acid, Venous: 1.1 mmol/L (ref 0.5–1.9)

## 2021-09-15 LAB — D-DIMER, QUANTITATIVE: D-Dimer, Quant: 0.49 ug/mL-FEU (ref 0.00–0.50)

## 2021-09-15 LAB — I-STAT CREATININE, ED: Creatinine, Ser: 2.5 mg/dL — ABNORMAL HIGH (ref 0.44–1.00)

## 2021-09-15 LAB — POC URINE PREG, ED: Preg Test, Ur: NEGATIVE

## 2021-09-15 MED ORDER — ATORVASTATIN CALCIUM 80 MG PO TABS
80.0000 mg | ORAL_TABLET | Freq: Every day | ORAL | Status: DC
  Administered 2021-09-16 – 2021-09-18 (×3): 80 mg via ORAL
  Filled 2021-09-15 (×3): qty 1

## 2021-09-15 MED ORDER — POTASSIUM CHLORIDE 10 MEQ/100ML IV SOLN
10.0000 meq | Freq: Once | INTRAVENOUS | Status: AC
  Administered 2021-09-15: 10 meq via INTRAVENOUS
  Filled 2021-09-15: qty 100

## 2021-09-15 MED ORDER — SODIUM CHLORIDE 0.9 % IV SOLN: INTRAVENOUS | Status: AC

## 2021-09-15 MED ORDER — ASPIRIN 81 MG PO CHEW
324.0000 mg | CHEWABLE_TABLET | ORAL | Status: AC
  Administered 2021-09-15: 324 mg via ORAL
  Filled 2021-09-15: qty 4

## 2021-09-15 MED ORDER — SODIUM CHLORIDE 0.9 % IV SOLN
1.0000 g | Freq: Once | INTRAVENOUS | Status: AC
  Administered 2021-09-15: 1 g via INTRAVENOUS
  Filled 2021-09-15: qty 10

## 2021-09-15 MED ORDER — POTASSIUM CHLORIDE CRYS ER 20 MEQ PO TBCR
40.0000 meq | EXTENDED_RELEASE_TABLET | Freq: Once | ORAL | Status: AC
  Administered 2021-09-15: 40 meq via ORAL
  Filled 2021-09-15: qty 2

## 2021-09-15 MED ORDER — NITROFURANTOIN MONOHYD MACRO 100 MG PO CAPS: 100.0000 mg | ORAL_CAPSULE | Freq: Two times a day (BID) | ORAL | Status: DC

## 2021-09-15 MED ORDER — CARVEDILOL 3.125 MG PO TABS
3.1250 mg | ORAL_TABLET | Freq: Two times a day (BID) | ORAL | Status: DC
  Administered 2021-09-16 – 2021-09-17 (×3): 3.125 mg via ORAL
  Filled 2021-09-15 (×3): qty 1

## 2021-09-15 MED ORDER — HEPARIN BOLUS VIA INFUSION
3900.0000 [IU] | Freq: Once | INTRAVENOUS | Status: AC
  Administered 2021-09-15: 3900 [IU] via INTRAVENOUS

## 2021-09-15 MED ORDER — ONDANSETRON HCL 4 MG/2ML IJ SOLN
4.0000 mg | Freq: Four times a day (QID) | INTRAMUSCULAR | Status: DC | PRN
Start: 2021-09-15 — End: 2021-09-17

## 2021-09-15 MED ORDER — ASPIRIN 81 MG PO TBEC
81.0000 mg | DELAYED_RELEASE_TABLET | Freq: Every day | ORAL | Status: DC
  Administered 2021-09-16 – 2021-09-18 (×2): 81 mg via ORAL
  Filled 2021-09-15 (×2): qty 1

## 2021-09-15 MED ORDER — NITROGLYCERIN 0.4 MG SL SUBL
0.4000 mg | SUBLINGUAL_TABLET | SUBLINGUAL | Status: DC | PRN
  Administered 2021-09-16 (×2): 0.4 mg via SUBLINGUAL
  Filled 2021-09-15: qty 1

## 2021-09-15 MED ORDER — HEPARIN (PORCINE) 25000 UT/250ML-% IV SOLN
1250.0000 [IU]/h | INTRAVENOUS | Status: DC
Start: 2021-09-15 — End: 2021-09-17
  Administered 2021-09-15: 800 [IU]/h via INTRAVENOUS
  Administered 2021-09-16: 1150 [IU]/h via INTRAVENOUS
  Filled 2021-09-15 (×2): qty 250

## 2021-09-15 MED ORDER — ACETAMINOPHEN 325 MG PO TABS: 650.0000 mg | ORAL_TABLET | ORAL | Status: DC | PRN

## 2021-09-15 MED ORDER — ASPIRIN 300 MG RE SUPP: 300.0000 mg | RECTAL | Status: AC

## 2021-09-15 MED ORDER — LACTATED RINGERS IV BOLUS (SEPSIS)
1000.0000 mL | Freq: Once | INTRAVENOUS | Status: AC
  Administered 2021-09-15: 1000 mL via INTRAVENOUS

## 2021-09-15 MED ORDER — SODIUM CHLORIDE 0.9 % IV SOLN
1.0000 g | INTRAVENOUS | Status: DC
  Administered 2021-09-16: 1 g via INTRAVENOUS
  Filled 2021-09-15 (×2): qty 10

## 2021-09-15 MED ORDER — LACTATED RINGERS IV BOLUS
1000.0000 mL | Freq: Once | INTRAVENOUS | Status: AC
  Administered 2021-09-15: 1000 mL via INTRAVENOUS

## 2021-09-15 NOTE — ED Notes (Signed)
Patient transported to CT 

## 2021-09-15 NOTE — Progress Notes (Addendum)
ANTICOAGULATION CONSULT NOTE - Initial Consult  Pharmacy Consult for heparin gtt Indication: chest pain/ACS  Allergies  Allergen Reactions   Adhesive [Tape] Rash    Patient Measurements: Height: 5\' 3"  (160 cm) Weight: 64.9 kg (143 lb) IBW/kg (Calculated) : 52.4 Heparin Dosing Weight: 64.9 kg  Vital Signs: Temp: 98.2 F (36.8 C) (07/31 1804) Temp Source: Oral (07/31 1804) BP: 126/72 (07/31 1804) Pulse Rate: 80 (07/31 1804)  Labs: Recent Labs    09/15/21 1429 09/15/21 1514 09/15/21 1515 09/15/21 1613  HGB 14.3 15.6*  --   --   HCT 43.4 46.0  --   --   PLT 392  --   --   --   LABPROT 13.0  --   --   --   INR 1.0  --   --   --   CREATININE 2.29* 2.50* 2.50*  --   TROPONINIHS 253*  --   --  419*    Estimated Creatinine Clearance: 26.8 mL/min (A) (by C-G formula based on SCr of 2.5 mg/dL (H)).   Medical History: Past Medical History:  Diagnosis Date   Anxiety    Atrophy of left kidney    Depression    History of kidney stones    Hydrosalpinx    Hypertension    followed by pcp   Nephrolithiasis    per CT 05-06-2021  right renal stone nonostructive   Pain, female pelvic    Urgency of urination    Medications: Aspirin EC 81 mg daily Atorvastatin (Lipitor) 80 mg daily Carvedilol (Coreg) 3.125 mg BID Ceftriaxone 1 g IV q24 hr  Assessment: Pt is a 41 y.o. female with medical history significant of hypertension, anxiety/depression, apparently has had dyspnea over the past 4 weeks c/o feeling weak, and dyspneic while at work today and is now ordered heparin for ACS (no STEMI).  Goal of Therapy:  Heparin level goal = 0-3.-0.7 Monitor for s/sx of bleeding  Monitor platelets by anticoagulation protocol: Yes   Plan:  Give 3900  units bolus x 1 Start heparin infusion at 800 units/hr Check anti-Xa level in 8 hours and daily while on heparin Continue to monitor H&H and platelets  46 09/15/2021,6:16 PM

## 2021-09-15 NOTE — ED Triage Notes (Signed)
Pt with c/o SOB since last Thursday.  States she had to leave work early on Thursday. C/o severe fatigue.  Vision blurry starting today.  "My bones just ache"

## 2021-09-15 NOTE — Progress Notes (Signed)
CRITICAL VALUE STICKER  CRITICAL VALUE: troponin 1180  RECEIVER (on-site recipient of call): Molli Hazard  DATE & TIME NOTIFIED: 7/31 2335  MESSENGER (representative from lab):  MD NOTIFIED: Opyd MD  TIME OF NOTIFICATION: 2337  RESPONSE: Reassess troponin and continue to monitor for signs of distress

## 2021-09-15 NOTE — ED Notes (Signed)
Both sets of cultures drawn before antibiotic administration  

## 2021-09-15 NOTE — ED Notes (Signed)
Portable equipment called for more IV channel

## 2021-09-15 NOTE — H&P (Addendum)
History and Physical    Patient: [name] DGL:[id number] DOB: 1980-10-02 DOA: 09/15/2021 DOS: the patient was seen and examined on 09/15/2021 PCP: [facility]  Patient coming from: work  Chief Complaint:  Chief Complaint  Patient presents with   Shortness of Breath   HPI: [name] is a 41 y.o. female with medical history significant of hypertension, anxiety/depression, apparently has had dyspnea over the past 4 weeks c/o feeling weak, and dyspneic while at work today.  Pt denies syncope, cp, palp, fever, chills,  cough, n/v, diarrhea, brbpr, black stool, lower ext swelling, wt gain.  Pt denies family hx of CAD, or blood clots.  Pt denies cocaine / drug use.   Pt notes started on nitrofurantoin recently for UTI.    In ED,  CT chest/ abd , pelvis w/o contrast=>  IMPRESSION: Mild dependent atelectasis in LEFT lower lobe. Atrophic LEFT kidney with chronic mild hydronephrosis. RIGHT hydronephrosis again identified though no definite ureteral calcification or dilatation is seen. Tiny nonobstructing inferior pole LEFT renal calculus. Mild chronic bladder wall thickening question cystitis, recommend correlation with urinalysis. No acute intrathoracic, intra-abdominal, or intrapelvic abnormalities.  Urinalysis => wbc 21-50, rbc 0-5, bacteria many Blood culture x 2 pending Lactic acid 1.2 Na 137, K 2.9, Bun 40, Creatinine 2.29 (prev 1.2) Wbc 17.1, Hgb 14.3, Plt 392  D dimer 0.49 Trop 253-> 419  Ekg -> unable to view, per ED, no STEMI  ED spoke with Dr. Tenny Craw (cardiology) who recommended per ED heparin iv and transfer to [facility] for further evaluation.   Pt is chest pain free and will be transferred to [facility] for admission for Dyspnea, Elevated troponin.      Review of Systems: + weight loss 5 lbs over the past few weeks, otherwise negative for all 10 organ systems except for + above  Past Medical History:  Diagnosis Date   Anxiety     Atrophy of left kidney    Depression    History of kidney stones    Hydrosalpinx    Hypertension    followed by pcp   Nephrolithiasis    per CT 05-06-2021  right renal stone nonostructive   Pain, female pelvic    Urgency of urination    Past Surgical History:  Procedure Laterality Date   APPENDECTOMY  age 74   ARTHOSCOPIC 16 CUFF REPAIR Right 10/31/2019   Procedure: ARTHROSCOPIC ROTATOR CUFF REPAIR;  Surgeon: Vickki Hearing, MD;  Location: [facility];  Service: Orthopedics;  Laterality: Right;   BALLOON DILATION Left 01/17/2013   Procedure: BALLOON DILATION OF LEFT URETERAL STRICTURE;  Surgeon: Ky Barban, MD;  Location: [facility];  Service: Urology;  Laterality: Left;   CYSTOSCOPY W/ URETERAL STENT PLACEMENT Left 01/17/2013   Procedure: CYSTOSCOPY WITH LEFT RETROGRADE PYELOGRAM; LEFT URETERAL STENT PLACEMENT;  Surgeon: Ky Barban, MD;  Location: [facility];  Service: Urology;  Laterality: Left;   CYSTOSCOPY WITH RETROGRADE PYELOGRAM, URETEROSCOPY AND STENT PLACEMENT Bilateral 12/23/2018   Procedure: CYSTOSCOPY WITH RETROGRADE PYELOGRAM, URETEROSCOPY AND STENT PLACEMENT;  Surgeon: Sebastian Ache, MD;  Location: [facility];  Service: Urology;  Laterality: Bilateral;  90 MINS   CYSTOSCOPY WITH RETROGRADE PYELOGRAM, URETEROSCOPY AND STENT PLACEMENT Bilateral 01/06/2019   Procedure: CYSTOSCOPY WITH RETROGRADE PYELOGRAM, URETEROSCOPY AND STENT PLACEMENT;  Surgeon: Sebastian Ache, MD;  Location: [facility];  Service: Urology;  Laterality: Bilateral;  90 MINS   CYSTOSCOPY WITH RETROGRADE PYELOGRAM, URETEROSCOPY AND STENT PLACEMENT Bilateral 11/15/2020  Procedure: CYSTOSCOPY WITH RETROGRADE PYELOGRAM, LEFT DIAGNOSTIC URETEROSCOPY, RIGHT DIAGNOSTIC URETEROSCOPY WITH BIOPSY; RIGHT STENT PLACEMENT;  Surgeon: Sebastian Ache, MD;  Location: [facility];  Service: Urology;  Laterality: Bilateral;  75 MINS   CYSTOSCOPY/URETEROSCOPY/HOLMIUM  LASER/STENT PLACEMENT  2008;  2010;  2011;  2012   FLEXIBLE URETEROSCOPY Left 01/17/2013   Procedure: FLEXIBLE LEFT URETEROSCOPY; ATTEMPTED STONE BASKET EXTRACTION;  Surgeon: Ky Barban, MD;  Location: [facility];  Service: Urology;  Laterality: Left;   HOLMIUM LASER APPLICATION Bilateral 12/23/2018   Procedure: HOLMIUM LASER APPLICATION;  Surgeon: Sebastian Ache, MD;  Location: [facility];  Service: Urology;  Laterality: Bilateral;   HOLMIUM LASER APPLICATION Bilateral 01/06/2019   Procedure: HOLMIUM LASER APPLICATION;  Surgeon: Sebastian Ache, MD;  Location: [facility];  Service: Urology;  Laterality: Bilateral;   LAPAROSCOPIC BILATERAL SALPINGECTOMY Bilateral 06/30/2021   Procedure: LAPAROSCOPIC BILATERAL SALPINGECTOMY; LYSIS OF ADHESIONS;  Surgeon: Lyn Henri, MD;  Location: [facility];  Service: Gynecology;  Laterality: Bilateral;   PERCUTANEOUS NEPHROSTOLITHOTOMY  06/15/2011   @[facility]    Social History:  reports that she has been smoking cigars. She has never used smokeless tobacco. She reports current alcohol use. She reports that she does not use drugs.  Allergies  Allergen Reactions   Adhesive [Tape] Rash    Family History  Problem Relation Age of Onset   Hypertension Mother    Hypertension Father    Thyroid disease Father    Hypertension Brother    CAD Neg Hx     Prior to Admission medications   Medication Sig Start Date End Date Taking? Authorizing Provider  nitrofurantoin, macrocrystal-monohydrate, (MACROBID) 100 MG capsule Take 100 mg by mouth 2 (two) times daily. 09/11/21  Yes [provider]  olmesartan-hydrochlorothiazide (BENICAR HCT) 40-25 MG tablet Take 1 tablet by mouth daily.   Yes [provider]  acetaminophen (TYLENOL) 325 MG tablet Take 2 tablets (650 mg total) by mouth every 6 (six) hours as needed. Patient not taking: Reported on 09/15/2021 06/30/21   07/02/21, MD  IBU 800  MG tablet TAKE ONE TABLET BY MOUTH EVERY 8 HOURS AS NEEDED Patient not taking: Reported on 09/15/2021 06/05/20   06/07/20, MD  oxyCODONE (ROXICODONE) 5 MG immediate release tablet Take 1 tablet (5 mg total) by mouth every 4 (four) hours as needed for severe pain. Patient not taking: Reported on 09/15/2021 06/30/21   07/02/21, MD    Physical Exam: Vitals:   09/15/21 1640 09/15/21 1700 09/15/21 1725 09/15/21 1804  BP: 91/64 108/63 120/63 126/72  Pulse: 78 75 71 80  Resp: 15 20 19 18   Temp:    98.2 F (36.8 C)  TempSrc:    Oral  SpO2: 100% 100% 100% 100%  Weight:      Height:       Exam: Heent: anicteric, pupils 1.74mm symmetric, direct, consensual, near intact Neck: no jvd, no bruit Heart: rrr s1, s2, no m/g/r Lung: ctab Abd: soft, nt, nd, +bs Ext: no c/c/e, neg homans Neuro: nonfocal, motor 5/5 in all 4 ext, reflexes 2+ symmetric, diffuse with no clonus Skin: no rash Lymph: no cervical adenoapthy  Data Reviewed:   Assessment and Plan:  Elevated troponin/ NSTEMI Dr. , Cardiology consulted by ED who recommended transfer to [facility] Check cardiac echo Cycle trop I Check UDS Start aspirin Start Lipitor 80mg  po qhs Start Carvedilol 3.125mg  po bid Cont Heparin iv pharmacy to dose Appreciate cardiology  input.  ARF Hold Olmesartan/hydrochlorothiazide Hydrate with ns iv at per hour Check cmp in am  Hypokalemia Repleted in ED, Kcl iv x1, Kcl po x1 Check cmp in am  UTI/ Leukocytosis Stop Macrobid due to ARF Rocephin 1gm iv qday Await culture   Advance Care Planning:   Code Status: Full Code   Consults: Cardiology consult placed in computer, see above as well  Family Communication: w significant other  Severity of Illness: Pt will require inpatient admission for Elevated troponin / NSTEMI, and further evaluation of dyspnea and evaluation of ARF , and UTI that will require 2+ days hospitalization  Author: Pearson Grippe,  MD 09/15/2021 7:56 PM  For on call review [email or web address].

## 2021-09-15 NOTE — ED Notes (Signed)
Per Pearson Grippe, MD, pt is able to eat

## 2021-09-15 NOTE — ED Provider Notes (Signed)
[facility] EMERGENCY DEPARTMENT Provider Note   CSN: [id number] Arrival date & time: 09/15/21  1405     History  Chief Complaint  Patient presents with   Shortness of Breath    [name] is a 41 y.o. female.   Shortness of Breath Patient presents for fatigue and shortness of breath.  Medical history includes anxiety, pyelonephritis, nephrolithiasis, depression.  She describes a ongoing progression of symptoms over the past several weeks.  She denies any associated chest pain.  She denies any current areas of any discomfort.  She does endorse ongoing shortness of breath, even at rest.  She has experienced diffuse body aches as well as some bilateral blurry vision today.  She denies any areas of numbness or weakness.     Home Medications Prior to Admission medications   Medication Sig Start Date End Date Taking? Authorizing Provider  nitrofurantoin, macrocrystal-monohydrate, (MACROBID) 100 MG capsule Take 100 mg by mouth 2 (two) times daily. 09/11/21  Yes [provider]  olmesartan-hydrochlorothiazide (BENICAR HCT) 40-25 MG tablet Take 1 tablet by mouth daily.   Yes [provider]  acetaminophen (TYLENOL) 325 MG tablet Take 2 tablets (650 mg total) by mouth every 6 (six) hours as needed. Patient not taking: Reported on 09/15/2021 06/30/21   Lyn Henri, MD  IBU 800 MG tablet TAKE ONE TABLET BY MOUTH EVERY 8 HOURS AS NEEDED Patient not taking: Reported on 09/15/2021 06/05/20   Vickki Hearing, MD  oxyCODONE (ROXICODONE) 5 MG immediate release tablet Take 1 tablet (5 mg total) by mouth every 4 (four) hours as needed for severe pain. Patient not taking: Reported on 09/15/2021 06/30/21   Lyn Henri, MD      Allergies    Adhesive [tape]    Review of Systems   Review of Systems  Constitutional:  Positive for activity change, appetite change and fatigue.  Eyes:  Positive for visual disturbance.  Respiratory:  Positive for shortness of breath.    Musculoskeletal:  Positive for myalgias.  Neurological:  Positive for weakness (Generalized).  All other systems reviewed and are negative.   Physical Exam Updated Vital Signs BP 126/72 (BP Location: Left Arm)   Pulse 80   Temp 98.2 F (36.8 C) (Oral)   Resp 18   Ht 5\' 3"  (1.6 m)   Wt 64.9 kg   LMP 09/11/2021   SpO2 100%   BMI 25.33 kg/m  Physical Exam Vitals and nursing note reviewed.  Constitutional:      General: She is not in acute distress.    Appearance: She is well-developed and normal weight. She is ill-appearing. She is not toxic-appearing or diaphoretic.  HENT:     Head: Normocephalic and atraumatic.     Mouth/Throat:     Mouth: Mucous membranes are moist.     Pharynx: Oropharynx is clear.  Eyes:     Conjunctiva/sclera: Conjunctivae normal.  Cardiovascular:     Rate and Rhythm: Regular rhythm. Tachycardia present.     Heart sounds: No murmur heard. Pulmonary:     Effort: Pulmonary effort is normal. Tachypnea present. No accessory muscle usage or respiratory distress.     Breath sounds: Normal breath sounds. No decreased breath sounds, wheezing, rhonchi or rales.  Chest:     Chest wall: No tenderness.  Abdominal:     Palpations: Abdomen is soft.     Tenderness: There is no abdominal tenderness.  Musculoskeletal:        General: No swelling. Normal range  of motion.     Cervical back: Normal range of motion and neck supple.     Right lower leg: No edema.     Left lower leg: No edema.  Skin:    General: Skin is warm and dry.  Neurological:     General: No focal deficit present.     Mental Status: She is alert and oriented to person, place, and time.  Psychiatric:        Mood and Affect: Mood normal.        Behavior: Behavior normal.     ED Results / Procedures / Treatments   Labs (all labs ordered are listed, but only abnormal results are displayed) Labs Reviewed  COMPREHENSIVE METABOLIC PANEL - Abnormal; Notable for the following components:       Result Value   Potassium 2.9 (*)    CO2 21 (*)    Glucose, Bld 128 (*)    BUN 40 (*)    Creatinine, Ser 2.29 (*)    Total Protein 8.2 (*)    GFR, Estimated 27 (*)    All other components within normal limits  CBC WITH DIFFERENTIAL/PLATELET - Abnormal; Notable for the following components:   WBC 17.1 (*)    RBC 5.20 (*)    Neutro Abs 12.2 (*)    Abs Immature Granulocytes 0.11 (*)    All other components within normal limits  URINALYSIS, ROUTINE W REFLEX MICROSCOPIC - Abnormal; Notable for the following components:   APPearance HAZY (*)    Hgb urine dipstick LARGE (*)    Nitrite POSITIVE (*)    Leukocytes,Ua MODERATE (*)    Bacteria, UA MANY (*)    All other components within normal limits  I-STAT CHEM 8, ED - Abnormal; Notable for the following components:   Potassium 2.8 (*)    BUN 38 (*)    Creatinine, Ser 2.50 (*)    Glucose, Bld 129 (*)    Hemoglobin 15.6 (*)    All other components within normal limits  I-STAT CREATININE, ED - Abnormal; Notable for the following components:   Creatinine, Ser 2.50 (*)    All other components within normal limits  TROPONIN I (HIGH SENSITIVITY) - Abnormal; Notable for the following components:   Troponin I (High Sensitivity) 253 (*)    All other components within normal limits  TROPONIN I (HIGH SENSITIVITY) - Abnormal; Notable for the following components:   Troponin I (High Sensitivity) 419 (*)    All other components within normal limits  CULTURE, BLOOD (ROUTINE X 2)  CULTURE, BLOOD (ROUTINE X 2)  URINE CULTURE  LACTIC ACID, PLASMA  LACTIC ACID, PLASMA  PROTIME-INR  D-DIMER, QUANTITATIVE  MAGNESIUM  HIV ANTIBODY (ROUTINE TESTING W REFLEX)  LIPOPROTEIN A (LPA)  COMPREHENSIVE METABOLIC PANEL  TSH  LIPID PANEL  CK TOTAL AND CKMB (NOT AT [facility])  POC URINE PREG, ED  TROPONIN I (HIGH SENSITIVITY)    EKG None  Radiology CT CHEST ABDOMEN PELVIS WO CONTRAST  Result Date: 09/15/2021 CLINICAL DATA:  Sepsis, shortness of breath,  fatigue, blurred vision, aching, history of hypertension, LEFT renal atrophy EXAM: CT CHEST, ABDOMEN AND PELVIS WITHOUT CONTRAST TECHNIQUE: Multidetector CT imaging of the chest, abdomen and pelvis was performed following the standard protocol without IV contrast. RADIATION DOSE REDUCTION: This exam was performed according to the departmental dose-optimization program which includes automated exposure control, adjustment of the mA and/or kV according to patient size and/or use of iterative reconstruction technique. COMPARISON:  CT abdomen and pelvis  05/06/2021 FINDINGS: CT CHEST FINDINGS Cardiovascular: Aorta normal caliber. Heart unremarkable. No pericardial effusion. Mediastinum/Nodes: Esophagus normal appearance. Base of cervical region unremarkable. No thoracic adenopathy. Lungs/Pleura: Mild dependent atelectasis in LEFT lower lobe. Remaining lungs clear. No infiltrate, pleural effusion, pneumothorax, or mass. Musculoskeletal: Unremarkable CT ABDOMEN PELVIS FINDINGS Hepatobiliary: Gallbladder and liver normal appearance Pancreas: Normal appearance Spleen: Normal appearance Adrenals/Urinary Tract: Adrenal glands normal appearance. Atrophic LEFT kidney with chronic mild hydronephrosis. Tiny calculus inferior pole LEFT kidney. RIGHT hydronephrosis again identified though no definite ureteral calcification or dilatation is seen. Mild diffuse bladder wall thickening. No renal masses. Stomach/Bowel: Appendix surgically absent by history. Stomach and bowel loops normal appearance. Vascular/Lymphatic: Aorta normal caliber.  No adenopathy. Reproductive: Uterus and ovaries normal appearance. Tampon in vagina. Other: No free air or free fluid. No hernia or inflammatory process. Musculoskeletal: Unremarkable IMPRESSION: Mild dependent atelectasis in LEFT lower lobe. Atrophic LEFT kidney with chronic mild hydronephrosis. RIGHT hydronephrosis again identified though no definite ureteral calcification or dilatation is seen.  Tiny nonobstructing inferior pole LEFT renal calculus. Mild chronic bladder wall thickening question cystitis, recommend correlation with urinalysis. No acute intrathoracic, intra-abdominal, or intrapelvic abnormalities. Electronically Signed   By: Ulyses SouthwardMark  Boles M.D.   On: 09/15/2021 18:09   DG Chest Port 1 View  Result Date: 09/15/2021 CLINICAL DATA:  Shortness of breath since last Thursday EXAM: PORTABLE CHEST 1 VIEW COMPARISON:  Chest radiograph 07/22/2019 FINDINGS: Cardiomediastinal silhouette is normal. There is no focal consolidation or pulmonary edema. There is no pleural effusion or pneumothorax. A probable calcified granuloma in the right upper lobe is unchanged. There is no acute osseous abnormality. IMPRESSION: No radiographic evidence of acute cardiopulmonary process. Electronically Signed   By: Lesia HausenPeter  Noone M.D.   On: 09/15/2021 14:55    Procedures Procedures    Medications Ordered in ED Medications  potassium chloride 10 mEq in 100 mL IVPB (10 mEq Intravenous New Bag/Given 09/15/21 1821)  heparin bolus via infusion 3,900 Units (has no administration in time range)  heparin ADULT infusion 100 units/mL (25000 units/24150mL) (has no administration in time range)  aspirin chewable tablet 324 mg (has no administration in time range)    Or  aspirin suppository 300 mg (has no administration in time range)  aspirin EC tablet 81 mg (has no administration in time range)  nitroGLYCERIN (NITROSTAT) SL tablet 0.4 mg (has no administration in time range)  acetaminophen (TYLENOL) tablet 650 mg (has no administration in time range)  ondansetron (ZOFRAN) injection 4 mg (has no administration in time range)  0.9 %  sodium chloride infusion (has no administration in time range)  carvedilol (COREG) tablet 3.125 mg (has no administration in time range)  atorvastatin (LIPITOR) tablet 80 mg (has no administration in time range)  cefTRIAXone (ROCEPHIN) 1 g in sodium chloride 0.9 % 100 mL IVPB (has no  administration in time range)  lactated ringers bolus 1,000 mL (0 mLs Intravenous Stopped 09/15/21 1633)  lactated ringers bolus 1,000 mL (1,000 mLs Intravenous Bolus 09/15/21 1640)  potassium chloride SA (KLOR-CON M) CR tablet 40 mEq (40 mEq Oral Given 09/15/21 1639)    ED Course/ Medical Decision Making/ A&P                           Medical Decision Making Amount and/or Complexity of Data Reviewed Labs: ordered. Radiology: ordered. ECG/medicine tests: ordered.  Risk Prescription drug management. Decision regarding hospitalization.   This patient presents to the ED for concern of  fatigue and shortness of breath, this involves an extensive number of treatment options, and is a complaint that carries with it a high risk of complications and morbidity.  The differential diagnosis includes sepsis, dehydration, pneumonia, PE, ACS, pericarditis, myocarditis, new CHF   Co morbidities that complicate the patient evaluation  Anxiety, depression, prior episodes of pyelonephritis and nephrolithiasis   Additional history obtained:  Additional history obtained from patient's significant other External records from outside source obtained and reviewed including EMR   Lab Tests:  I Ordered, and personally interpreted labs.  The pertinent results include: A leukocytosis is present.  Patient has AKI with azotemia suggestive of prerenal etiology.  Hypokalemia is present with otherwise normal electrolytes, troponin is elevated at 253.  Lactate is normal.   Imaging Studies ordered:  I ordered imaging studies including chest x-ray, CT of chest, abdomen, and pelvis  I independently visualized and interpreted imaging which showed no acute findings on chest x-ray, CT scan pending at time of signout I agree with the radiologist interpretation   Cardiac Monitoring: / EKG:  The patient was maintained on a cardiac monitor.  I personally viewed and interpreted the cardiac monitored which showed an  underlying rhythm of: Sinus rhythm  Problem List / ED Course / Critical interventions / Medication management  Patient is a 41 year old female who presents for several weeks of ongoing, progressive generalized weakness, fatigue, and shortness of breath.  Symptoms have become severe over the past several days.  Additionally, she has experienced diffuse body aches and did have some blurry vision earlier today.  On arrival in the ED, patient is tachycardic, tachypneic, and hypotensive.  This raises concerns of sepsis.  Sepsis work-up was initiated.  IV fluids were started and patient's heart rate and blood pressure responded appropriately.  Patient was ordered a full 30 cc/kg of IVF.  Lab work shows a leukocytosis, AKI with azotemia, suggestive of prerenal etiology, hypokalemia, and elevated troponin.  On EKG, patient does have T wave inversions in lateral leads.  Per chart review, this has been present on prior EKG.  I suspect elevation in troponin is secondary to demand ischemia.  Replacement potassium was ordered for her hypokalemia.  On reassessment, patient has sustained improved vital signs.  Chest x-ray did not show source of infection.  A CT scan of chest, abdomen, and pelvis was ordered to further evaluate chest and to identify any possible sources of intra-abdominal infection.  Of note, patient underwent surgery for hydrosalpinx 2 months ago.  Urine studies are pending at time of signout.  Care of patient was signed out to oncoming ED provider. I ordered medication including IV fluids for dehydration and AKI; potassium chloride for hypokalemia Reevaluation of the patient after these medicines showed that the patient improved I have reviewed the patients home medicines and have made adjustments as needed   Social Determinants of Health:  Has access to outpatient care  CRITICAL CARE Performed by: Gloris Manchester   Total critical care time: 35 minutes  Critical care time was exclusive of  separately billable procedures and treating other patients.  Critical care was necessary to treat or prevent imminent or life-threatening deterioration.  Critical care was time spent personally by me on the following activities: development of treatment plan with patient and/or surrogate as well as nursing, discussions with consultants, evaluation of patient's response to treatment, examination of patient, obtaining history from patient or surrogate, ordering and performing treatments and interventions, ordering and review of laboratory studies, ordering and review  of radiographic studies, pulse oximetry and re-evaluation of patient's condition.         Final Clinical Impression(s) / ED Diagnoses Final diagnoses:  Pyelonephritis  AKI (acute kidney injury) (HCC)  Hypokalemia    Rx / DC Orders ED Discharge Orders     None         Gloris Manchester, MD 09/15/21 [phone]

## 2021-09-16 ENCOUNTER — Inpatient Hospital Stay (HOSPITAL_COMMUNITY): Payer: 59

## 2021-09-16 DIAGNOSIS — I10 Essential (primary) hypertension: Secondary | ICD-10-CM

## 2021-09-16 DIAGNOSIS — N12 Tubulo-interstitial nephritis, not specified as acute or chronic: Secondary | ICD-10-CM

## 2021-09-16 DIAGNOSIS — N179 Acute kidney failure, unspecified: Secondary | ICD-10-CM | POA: Diagnosis not present

## 2021-09-16 DIAGNOSIS — I214 Non-ST elevation (NSTEMI) myocardial infarction: Secondary | ICD-10-CM | POA: Diagnosis not present

## 2021-09-16 LAB — HIV ANTIBODY (ROUTINE TESTING W REFLEX): HIV Screen 4th Generation wRfx: NONREACTIVE

## 2021-09-16 LAB — CBC WITH DIFFERENTIAL/PLATELET
Abs Immature Granulocytes: 0.06 10*3/uL (ref 0.00–0.07)
Basophils Absolute: 0.1 10*3/uL (ref 0.0–0.1)
Basophils Relative: 1 %
Eosinophils Absolute: 0.4 10*3/uL (ref 0.0–0.5)
Eosinophils Relative: 3 %
HCT: 34.8 % — ABNORMAL LOW (ref 36.0–46.0)
Hemoglobin: 11.4 g/dL — ABNORMAL LOW (ref 12.0–15.0)
Immature Granulocytes: 1 %
Lymphocytes Relative: 46 %
Lymphs Abs: 5.8 10*3/uL — ABNORMAL HIGH (ref 0.7–4.0)
MCH: 27.8 pg (ref 26.0–34.0)
MCHC: 32.8 g/dL (ref 30.0–36.0)
MCV: 84.9 fL (ref 80.0–100.0)
Monocytes Absolute: 0.7 10*3/uL (ref 0.1–1.0)
Monocytes Relative: 6 %
Neutro Abs: 5.4 10*3/uL (ref 1.7–7.7)
Neutrophils Relative %: 43 %
Platelets: 326 10*3/uL (ref 150–400)
RBC: 4.1 MIL/uL (ref 3.87–5.11)
RDW: 13.9 % (ref 11.5–15.5)
WBC: 12.5 10*3/uL — ABNORMAL HIGH (ref 4.0–10.5)
nRBC: 0 % (ref 0.0–0.2)

## 2021-09-16 LAB — COMPREHENSIVE METABOLIC PANEL
ALT: 27 U/L (ref 0–44)
AST: 43 U/L — ABNORMAL HIGH (ref 15–41)
Albumin: 2.4 g/dL — ABNORMAL LOW (ref 3.5–5.0)
Alkaline Phosphatase: 66 U/L (ref 38–126)
Anion gap: 3 — ABNORMAL LOW (ref 5–15)
BUN: 30 mg/dL — ABNORMAL HIGH (ref 6–20)
CO2: 23 mmol/L (ref 22–32)
Calcium: 8.1 mg/dL — ABNORMAL LOW (ref 8.9–10.3)
Chloride: 116 mmol/L — ABNORMAL HIGH (ref 98–111)
Creatinine, Ser: 1.55 mg/dL — ABNORMAL HIGH (ref 0.44–1.00)
GFR, Estimated: 43 mL/min — ABNORMAL LOW (ref >60)
Glucose, Bld: 101 mg/dL — ABNORMAL HIGH (ref 70–99)
Potassium: 3.4 mmol/L — ABNORMAL LOW (ref 3.5–5.1)
Sodium: 142 mmol/L (ref 135–145)
Total Bilirubin: 0.3 mg/dL (ref 0.3–1.2)
Total Protein: 5.7 g/dL — ABNORMAL LOW (ref 6.5–8.1)

## 2021-09-16 LAB — MAGNESIUM: Magnesium: 2 mg/dL (ref 1.7–2.4)

## 2021-09-16 LAB — TROPONIN I (HIGH SENSITIVITY): Troponin I (High Sensitivity): 1049 ng/L (ref <18)

## 2021-09-16 LAB — ECHOCARDIOGRAM COMPLETE
Area-P 1/2: 3.93 cm2
Height: 63 in
Weight: 2377.6 oz

## 2021-09-16 LAB — LIPID PANEL
Cholesterol: 154 mg/dL (ref 0–200)
HDL: 22 mg/dL — ABNORMAL LOW (ref >40)
LDL Cholesterol: 96 mg/dL (ref 0–99)
Total CHOL/HDL Ratio: 7 RATIO
Triglycerides: 181 mg/dL — ABNORMAL HIGH (ref <150)
VLDL: 36 mg/dL (ref 0–40)

## 2021-09-16 LAB — BASIC METABOLIC PANEL
Anion gap: 5 (ref 5–15)
BUN: 32 mg/dL — ABNORMAL HIGH (ref 6–20)
CO2: 21 mmol/L — ABNORMAL LOW (ref 22–32)
Calcium: 8.2 mg/dL — ABNORMAL LOW (ref 8.9–10.3)
Chloride: 115 mmol/L — ABNORMAL HIGH (ref 98–111)
Creatinine, Ser: 1.56 mg/dL — ABNORMAL HIGH (ref 0.44–1.00)
GFR, Estimated: 43 mL/min — ABNORMAL LOW (ref >60)
Glucose, Bld: 101 mg/dL — ABNORMAL HIGH (ref 70–99)
Potassium: 3.7 mmol/L (ref 3.5–5.1)
Sodium: 141 mmol/L (ref 135–145)

## 2021-09-16 LAB — HEPARIN LEVEL (UNFRACTIONATED)
Heparin Unfractionated: 0.16 IU/mL — ABNORMAL LOW (ref 0.30–0.70)
Heparin Unfractionated: 0.28 IU/mL — ABNORMAL LOW (ref 0.30–0.70)

## 2021-09-16 LAB — TSH: TSH: 2.063 u[IU]/mL (ref 0.350–4.500)

## 2021-09-16 MED ORDER — SODIUM CHLORIDE 0.9 % WEIGHT BASED INFUSION
3.0000 mL/kg/h | INTRAVENOUS | Status: DC
Start: 2021-09-17 — End: 2021-09-17
  Administered 2021-09-17: 3 mL/kg/h via INTRAVENOUS

## 2021-09-16 MED ORDER — SODIUM CHLORIDE 0.9 % WEIGHT BASED INFUSION
1.0000 mL/kg/h | INTRAVENOUS | Status: DC
  Administered 2021-09-17: 1 mL/kg/h via INTRAVENOUS

## 2021-09-16 MED ORDER — SODIUM CHLORIDE 0.9% FLUSH: 3.0000 mL | INTRAVENOUS | Status: DC | PRN

## 2021-09-16 MED ORDER — SODIUM CHLORIDE 0.9% FLUSH
3.0000 mL | Freq: Two times a day (BID) | INTRAVENOUS | Status: DC
Start: 2021-09-16 — End: 2021-09-18
  Administered 2021-09-17 – 2021-09-18 (×3): 3 mL via INTRAVENOUS

## 2021-09-16 MED ORDER — PERFLUTREN LIPID MICROSPHERE
1.0000 mL | INTRAVENOUS | Status: AC | PRN
  Administered 2021-09-16: 2 mL via INTRAVENOUS

## 2021-09-16 MED ORDER — SODIUM CHLORIDE 0.9 % IV SOLN: 250.0000 mL | INTRAVENOUS | Status: DC | PRN

## 2021-09-16 MED ORDER — ASPIRIN 81 MG PO CHEW
81.0000 mg | CHEWABLE_TABLET | ORAL | Status: AC
  Administered 2021-09-17: 81 mg via ORAL
  Filled 2021-09-16: qty 1

## 2021-09-16 NOTE — Progress Notes (Signed)
ANTICOAGULATION CONSULT NOTE   Pharmacy Consult for heparin  Indication: chest pain/ACS  Allergies  Allergen Reactions   Adhesive [Tape] Rash    Patient Measurements: Height: 5\' 3"  (160 cm) Weight: 67.4 kg (148 lb 9.6 oz) IBW/kg (Calculated) : 52.4 Heparin Dosing Weight: 64.9 kg  Vital Signs: Temp: 98.3 F (36.8 C) (08/01 1309) Temp Source: Oral (08/01 1309) BP: 137/77 (08/01 1319) Pulse Rate: 83 (08/01 1309)  Labs: Recent Labs    09/15/21 1429 09/15/21 1514 09/15/21 1515 09/15/21 1613 09/15/21 2226 09/16/21 0326 09/16/21 0331 09/16/21 1224  HGB 14.3 15.6*  --   --   --   --  11.4*  --   HCT 43.4 46.0  --   --   --   --  34.8*  --   PLT 392  --   --   --   --   --  326  --   LABPROT 13.0  --   --   --   --   --   --   --   INR 1.0  --   --   --   --   --   --   --   HEPARINUNFRC  --   --   --   --   --  0.16*  --  0.28*  CREATININE 2.29* 2.50* 2.50*  --   --  1.55* 1.56*  --   CKTOTAL  --   --   --   --  513*  --   --   --   CKMB  --   --   --   --  43.4*  --   --   --   TROPONINIHS 253*  --   --  419* 1,197* 1,049*  --   --      Estimated Creatinine Clearance: 43.8 mL/min (A) (by C-G formula based on SCr of 1.56 mg/dL (H)).   Medical History: Past Medical History:  Diagnosis Date   Anxiety    Atrophy of left kidney    Depression    History of kidney stones    Hydrosalpinx    Hypertension    followed by pcp   Nephrolithiasis    per CT 05-06-2021  right renal stone nonostructive   Pain, female pelvic    Urgency of urination     Assessment: Pt is a 41 y.o. female with medical history significant of hypertension, anxiety/depression, apparently has had dyspnea over the past 4 weeks c/o feeling weak, and dyspneic while at work today and is now ordered heparin for ACS (no STEMI).  Heparin level 0.28, cath planned for AM  Goal of Therapy:  Heparin level goal = 0-3.-0.7 Monitor platelets by anticoagulation protocol: Yes   Plan:  Inc heparin to 1150  units/hr Follow up AM heparin level, CBC  Thank you 46, PharmD

## 2021-09-16 NOTE — Progress Notes (Signed)
ANTICOAGULATION CONSULT NOTE   Pharmacy Consult for heparin  Indication: chest pain/ACS  Allergies  Allergen Reactions   Adhesive [Tape] Rash    Patient Measurements: Height: 5\' 3"  (160 cm) Weight: 67.4 kg (148 lb 9.6 oz) IBW/kg (Calculated) : 52.4 Heparin Dosing Weight: 64.9 kg  Vital Signs: Temp: 97.8 F (36.6 C) (08/01 0425) Temp Source: Oral (08/01 0425) BP: 122/62 (08/01 0425) Pulse Rate: 75 (08/01 0425)  Labs: Recent Labs    09/15/21 1429 09/15/21 1514 09/15/21 1515 09/15/21 1613  HGB 14.3 15.6*  --   --   HCT 43.4 46.0  --   --   PLT 392  --   --   --   LABPROT 13.0  --   --   --   INR 1.0  --   --   --   CREATININE 2.29* 2.50* 2.50*  --   TROPONINIHS 253*  --   --  419*     Estimated Creatinine Clearance: 27.3 mL/min (A) (by C-G formula based on SCr of 2.5 mg/dL (H)).   Medical History: Past Medical History:  Diagnosis Date   Anxiety    Atrophy of left kidney    Depression    History of kidney stones    Hydrosalpinx    Hypertension    followed by pcp   Nephrolithiasis    per CT 05-06-2021  right renal stone nonostructive   Pain, female pelvic    Urgency of urination    Medications: Aspirin EC 81 mg daily Atorvastatin (Lipitor) 80 mg daily Carvedilol (Coreg) 3.125 mg BID Ceftriaxone 1 g IV q24 hr  Assessment: Pt is a 41 y.o. female with medical history significant of hypertension, anxiety/depression, apparently has had dyspnea over the past 4 weeks c/o feeling weak, and dyspneic while at work today and is now ordered heparin for ACS (no STEMI).  8/1 AM update:  Heparin level low at 0.16 (not crossing into Epic-verified with lab report)  Goal of Therapy:  Heparin level goal = 0-3.-0.7 Monitor platelets by anticoagulation protocol: Yes   Plan:  Inc heparin to 1000 units/hr 1300 heparin level  46, PharmD, BCPS Clinical Pharmacist Phone: [phone]

## 2021-09-16 NOTE — Progress Notes (Addendum)
NPO order placed at 0726; last consumption of food or drink accounted for by RN was ginger ale given to patient at 0650 and finished sometime between 0650 and 0728. Patient expresses dissatisfaction with NPO order; educated on aspiration risk and alternatives to cope with dry mouth associated with NPO diet. Patient expressed understanding.  Breakfast tray had been delivered but removed from bedside by RN untouched to comply with NPO diet.

## 2021-09-16 NOTE — Consult Note (Addendum)
Cardiology Consultation:   Patient ID: [name] MRN: [id number]; DOB: [birth year]  Admit date: 09/15/2021 Date of Consult: 09/16/2021  PCP:  [facility]   [facility] Providers Cardiologist: Havery Moros    Patient Profile:   [name] is a 41 y.o. female with a hx of hypertension, anxiety/depression, kidney stones, pyelonephritis, nephrolithiasis who is being seen 09/16/2021 for the evaluation of NSTEMI at the request of Dr. Jerral Ralph.  History of Present Illness:   [name] is a 41 year old female with past medical history noted above.  She has not been evaluated by cardiology in the past.  Denies any family history of CAD.  Presented to the ED on 7/31 with ongoing complaints of shortness of breath as well as severe fatigue, and blurry vision.  Reports she has been having episodes of profound fatigue as well as weakness for several weeks.  Has been out of work several times secondary to her symptoms.  Attempted to go back to work yesterday morning and developed worsening weakness and fatigue which ultimately prompted her to come to the ED.  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  She is noticed that she is significantly dyspneic with her route.  Any chest pain.  In the ED her labs showed sodium 137, potassium 2.9, creatinine 2.2, high-sensitivity troponin 253>> 419>> 1197, WBC 17, lactic acid 1.2, hemoglobin 14.3.  UDS negative.  Chest x-ray negative.  CT chest abdomen pelvis with dependent atelectasis in left lower lobe, atrophic left kidney with chronic mild hydronephrosis, tiny nonobstructing inferior pole left renal calculus, chronic bladder wall thickening question cystitis with recommendations to correlate with urinalysis.  EKG showed sinus rhythm, 68 bpm deep T wave inversion in lateral leads, prolonged QT interval.  She was admitted to internal medicine for further management.  Cardiology asked to evaluate in regards to  her non-STEMI.  Past Medical History:  Diagnosis Date   Anxiety    Atrophy of left kidney    Depression    History of kidney stones    Hydrosalpinx    Hypertension    followed by pcp   Nephrolithiasis    per CT 05-06-2021  right renal stone nonostructive   Pain, female pelvic    Urgency of urination     Past Surgical History:  Procedure Laterality Date   APPENDECTOMY  age 7   ARTHOSCOPIC 64 CUFF REPAIR Right 10/31/2019   Procedure: ARTHROSCOPIC ROTATOR CUFF REPAIR;  Surgeon: Vickki Hearing, MD;  Location: [facility];  Service: Orthopedics;  Laterality: Right;   BALLOON DILATION Left 01/17/2013   Procedure: BALLOON DILATION OF LEFT URETERAL STRICTURE;  Surgeon: Ky Barban, MD;  Location: [facility];  Service: Urology;  Laterality: Left;   CYSTOSCOPY W/ URETERAL STENT PLACEMENT Left 01/17/2013   Procedure: CYSTOSCOPY WITH LEFT RETROGRADE PYELOGRAM; LEFT URETERAL STENT PLACEMENT;  Surgeon: Ky Barban, MD;  Location: [facility];  Service: Urology;  Laterality: Left;   CYSTOSCOPY WITH RETROGRADE PYELOGRAM, URETEROSCOPY AND STENT PLACEMENT Bilateral 12/23/2018   Procedure: CYSTOSCOPY WITH RETROGRADE PYELOGRAM, URETEROSCOPY AND STENT PLACEMENT;  Surgeon: Sebastian Ache, MD;  Location: [facility];  Service: Urology;  Laterality: Bilateral;  90 MINS   CYSTOSCOPY WITH RETROGRADE PYELOGRAM, URETEROSCOPY AND STENT PLACEMENT Bilateral 01/06/2019   Procedure: CYSTOSCOPY WITH RETROGRADE PYELOGRAM, URETEROSCOPY AND STENT PLACEMENT;  Surgeon: Sebastian Ache, MD;  Location: [facility];  Service: Urology;  Laterality: Bilateral;  90 MINS   CYSTOSCOPY WITH RETROGRADE  PYELOGRAM, URETEROSCOPY AND STENT PLACEMENT Bilateral 11/15/2020   Procedure: CYSTOSCOPY WITH RETROGRADE PYELOGRAM, LEFT DIAGNOSTIC URETEROSCOPY, RIGHT DIAGNOSTIC URETEROSCOPY WITH BIOPSY; RIGHT STENT PLACEMENT;  Surgeon: Alexis Frock, MD;  Location: [facility];  Service:  Urology;  Laterality: Bilateral;  75 MINS   CYSTOSCOPY/URETEROSCOPY/HOLMIUM LASER/STENT PLACEMENT  2008;  2010;  2011;  2012   FLEXIBLE URETEROSCOPY Left 01/17/2013   Procedure: FLEXIBLE LEFT URETEROSCOPY; ATTEMPTED STONE BASKET EXTRACTION;  Surgeon: Marissa Nestle, MD;  Location: [facility];  Service: Urology;  Laterality: Left;   HOLMIUM LASER APPLICATION Bilateral [id number]   Procedure: HOLMIUM LASER APPLICATION;  Surgeon: Alexis Frock, MD;  Location: [facility];  Service: Urology;  Laterality: Bilateral;   HOLMIUM LASER APPLICATION Bilateral [id number]   Procedure: HOLMIUM LASER APPLICATION;  Surgeon: Alexis Frock, MD;  Location: [facility];  Service: Urology;  Laterality: Bilateral;   LAPAROSCOPIC BILATERAL SALPINGECTOMY Bilateral 06/30/2021   Procedure: LAPAROSCOPIC BILATERAL SALPINGECTOMY; LYSIS OF ADHESIONS;  Surgeon: Carlyon Shadow, MD;  Location: [place];  Service: Gynecology;  Laterality: Bilateral;   PERCUTANEOUS NEPHROSTOLITHOTOMY  06/15/2011   @[facility]      Home Medications:  Prior to Admission medications   Medication Sig Start Date End Date Taking? Authorizing Provider  nitrofurantoin, macrocrystal-monohydrate, (MACROBID) 100 MG capsule Take 100 mg by mouth 2 (two) times daily. 09/11/21  Yes [provider]  olmesartan-hydrochlorothiazide (BENICAR HCT) 40-25 MG tablet Take 1 tablet by mouth daily.   Yes [provider]  acetaminophen (TYLENOL) 325 MG tablet Take 2 tablets (650 mg total) by mouth every 6 (six) hours as needed. Patient not taking: Reported on 09/15/2021 06/30/21   Carlyon Shadow, MD  IBU 800 MG tablet TAKE ONE TABLET BY MOUTH EVERY 8 HOURS AS NEEDED Patient not taking: Reported on 09/15/2021 06/05/20   Carole Civil, MD  oxyCODONE (ROXICODONE) 5 MG immediate release tablet Take 1 tablet (5 mg total) by mouth every 4 (four) hours as needed for severe pain. Patient not taking: Reported  on 09/15/2021 06/30/21   Carlyon Shadow, MD    Inpatient Medications: Scheduled Meds:  aspirin EC  81 mg Oral Daily   atorvastatin  80 mg Oral Daily   carvedilol  3.125 mg Oral BID WC   Continuous Infusions:  sodium chloride 100 mL/hr at 09/16/21 0739   cefTRIAXone (ROCEPHIN)  IV     heparin 1,000 Units/hr (09/16/21 0619)   PRN Meds: acetaminophen, nitroGLYCERIN, ondansetron (ZOFRAN) IV  Allergies:    Allergies  Allergen Reactions   Adhesive [Tape] Rash    Social History:   Social History   Socioeconomic History   Marital status: Single    Spouse name: Not on file   Number of children: Not on file   Years of education: Not on file   Highest education level: Not on file  Occupational History   Not on file  Tobacco Use   Smoking status: Some Days    Types: Cigars   Smokeless tobacco: Never   Tobacco comments:    06-25-2021  per pt 6 per week, black mal small cigar's  Vaping Use   Vaping Use: Never used  Substance and Sexual Activity   Alcohol use: Yes    Comment: occasional   Drug use: Never   Sexual activity: Not on file  Other Topics Concern   Not on file  Social History Narrative   Not on file   Social Determinants of Health   Financial Resource Strain: Not  on file  Food Insecurity: Not on file  Transportation Needs: Not on file  Physical Activity: Not on file  Stress: Not on file  Social Connections: Not on file  Intimate Partner Violence: Not on file    Family History:    Family History  Problem Relation Age of Onset   Hypertension Mother    Hypertension Father    Thyroid disease Father    Hypertension Brother    CAD Neg Hx      ROS:  Please see the history of present illness.   All other ROS reviewed and negative.     Physical Exam/Data:   Vitals:   09/16/21 0007 09/16/21 0256 09/16/21 0425 09/16/21 0745  BP: 116/64  122/62 132/80  Pulse: 79  75 67  Resp: 15  12 (!) 9  Temp: 97.7 F (36.5 C)  97.8 F (36.6 C) 97.8 F (36.6  C)  TempSrc: Oral  Oral Oral  SpO2: 97%  100% 100%  Weight:  67.4 kg    Height:        Intake/Output Summary (Last 24 hours) at 09/16/2021 1002 Last data filed at 09/16/2021 6213 Gross per 24 hour  Intake 1456.96 ml  Output --  Net 1456.96 ml      09/16/2021    2:56 AM 09/15/2021    9:25 PM 09/15/2021    2:14 PM  Last 3 Weights  Weight (lbs) 148 lb 9.6 oz 148 lb 14.4 oz 143 lb  Weight (kg) 67.405 kg 67.541 kg 64.864 kg     Body mass index is 26.32 kg/m.  General:  Well nourished, well developed, in no acute distress HEENT: normal Neck: no JVD Vascular: No carotid bruits; Distal pulses 2+ bilaterally Cardiac:  normal S1, S2; RRR; no murmur  Lungs:  clear to auscultation bilaterally, no wheezing, rhonchi or rales  Abd: soft, nontender, no hepatomegaly  Ext: no edema Musculoskeletal:  No deformities, BUE and BLE strength normal and equal Skin: warm and dry  Neuro:  CNs 2-12 intact, no focal abnormalities noted Psych:  Normal affect   EKG:  The EKG was personally reviewed and demonstrates:  sinus rhythm, 68 bpm deep T wave inversion in lateral leads, prolonged QT interval.  Relevant CV Studies:  Echo: pending  Laboratory Data:  High Sensitivity Troponin:   Recent Labs  Lab 09/15/21 1429 09/15/21 1613 09/15/21 2226 09/16/21 0326  TROPONINIHS 253* 419* 1,197* 1,049*     Chemistry Recent Labs  Lab 09/15/21 1429 09/15/21 1514 09/15/21 1515 09/16/21 0326 09/16/21 0331  NA 137 140  --  142 141  K 2.9* 2.8*  --  3.4* 3.7  CL 107 106  --  116* 115*  CO2 21*  --   --  23 21*  GLUCOSE 128* 129*  --  101* 101*  BUN 40* 38*  --  30* 32*  CREATININE 2.29* 2.50* 2.50* 1.55* 1.56*  CALCIUM 9.3  --   --  8.1* 8.2*  MG 2.4  --   --   --  2.0  GFRNONAA 27*  --   --  43* 43*  ANIONGAP 9  --   --  3* 5    Recent Labs  Lab 09/15/21 1429 09/16/21 0326  PROT 8.2* 5.7*  ALBUMIN 3.8 2.4*  AST 41 43*  ALT 34 27  ALKPHOS 88 66  BILITOT 0.4 0.3   Lipids  Recent Labs   Lab 09/16/21 0326  CHOL 154  TRIG 181*  HDL 22*  LDLCALC  96  CHOLHDL 7.0    Hematology Recent Labs  Lab 09/15/21 1429 09/15/21 1514 09/16/21 0331  WBC 17.1*  --  12.5*  RBC 5.20*  --  4.10  HGB 14.3 15.6* 11.4*  HCT 43.4 46.0 34.8*  MCV 83.5  --  84.9  MCH 27.5  --  27.8  MCHC 32.9  --  32.8  RDW 13.7  --  13.9  PLT 392  --  326   Thyroid  Recent Labs  Lab 09/16/21 0326  TSH 2.063    BNPNo results for input(s): "BNP", "PROBNP" in the last 168 hours.  DDimer  Recent Labs  Lab 09/15/21 1429  DDIMER 0.49     Radiology/Studies:  CT CHEST ABDOMEN PELVIS WO CONTRAST  Result Date: 09/15/2021 CLINICAL DATA:  Sepsis, shortness of breath, fatigue, blurred vision, aching, history of hypertension, LEFT renal atrophy EXAM: CT CHEST, ABDOMEN AND PELVIS WITHOUT CONTRAST TECHNIQUE: Multidetector CT imaging of the chest, abdomen and pelvis was performed following the standard protocol without IV contrast. RADIATION DOSE REDUCTION: This exam was performed according to the departmental dose-optimization program which includes automated exposure control, adjustment of the mA and/or kV according to patient size and/or use of iterative reconstruction technique. COMPARISON:  CT abdomen and pelvis 05/06/2021 FINDINGS: CT CHEST FINDINGS Cardiovascular: Aorta normal caliber. Heart unremarkable. No pericardial effusion. Mediastinum/Nodes: Esophagus normal appearance. Base of cervical region unremarkable. No thoracic adenopathy. Lungs/Pleura: Mild dependent atelectasis in LEFT lower lobe. Remaining lungs clear. No infiltrate, pleural effusion, pneumothorax, or mass. Musculoskeletal: Unremarkable CT ABDOMEN PELVIS FINDINGS Hepatobiliary: Gallbladder and liver normal appearance Pancreas: Normal appearance Spleen: Normal appearance Adrenals/Urinary Tract: Adrenal glands normal appearance. Atrophic LEFT kidney with chronic mild hydronephrosis. Tiny calculus inferior pole LEFT kidney. RIGHT hydronephrosis  again identified though no definite ureteral calcification or dilatation is seen. Mild diffuse bladder wall thickening. No renal masses. Stomach/Bowel: Appendix surgically absent by history. Stomach and bowel loops normal appearance. Vascular/Lymphatic: Aorta normal caliber.  No adenopathy. Reproductive: Uterus and ovaries normal appearance. Tampon in vagina. Other: No free air or free fluid. No hernia or inflammatory process. Musculoskeletal: Unremarkable IMPRESSION: Mild dependent atelectasis in LEFT lower lobe. Atrophic LEFT kidney with chronic mild hydronephrosis. RIGHT hydronephrosis again identified though no definite ureteral calcification or dilatation is seen. Tiny nonobstructing inferior pole LEFT renal calculus. Mild chronic bladder wall thickening question cystitis, recommend correlation with urinalysis. No acute intrathoracic, intra-abdominal, or intrapelvic abnormalities. Electronically Signed   By: Lavonia Dana M.D.   On: 09/15/2021 18:09   DG Chest Port 1 View  Result Date: 09/15/2021 CLINICAL DATA:  Shortness of breath since last Thursday EXAM: PORTABLE CHEST 1 VIEW COMPARISON:  Chest radiograph 07/22/2019 FINDINGS: Cardiomediastinal silhouette is normal. There is no focal consolidation or pulmonary edema. There is no pleural effusion or pneumothorax. A probable calcified granuloma in the right upper lobe is unchanged. There is no acute osseous abnormality. IMPRESSION: No radiographic evidence of acute cardiopulmonary process. Electronically Signed   By: Valetta Mole M.D.   On: 09/15/2021 14:55     Assessment and Plan:   ERCEL STROH is a 41 y.o. female with a hx of hypertension, anxiety/depression, kidney stones, pyelonephritis, nephrolithiasis who is being seen 09/16/2021 for the evaluation of NSTEMI at the request of Dr. Sloan Leiter.  NSTEMI Exertional dyspnea --Has been experiencing ongoing fatigue and exertional dyspnea for several weeks.  High-sensitivity troponin peaked at 1197.   EKG does show deep lateral T wave inversion with LVH similar to prior tracing back 06/2021. --Continue  continue IV heparin, aspirin, Lipitor 80 mg daily and carvedilol 3.125 mg twice daily --Echo pending --Discussed further evaluation with options including cardiac catheterization which patient is agreeable. Scheduled for tomorrow  Shared Decision Making/Informed Consent The risks [stroke (1 in 1000), death (1 in 1000), kidney failure [usually temporary] (1 in 500), bleeding (1 in 200), allergic reaction [possibly serious] (1 in 200)], benefits (diagnostic support and management of coronary artery disease) and alternatives of a cardiac catheterization were discussed in detail with Ms. Hammond and she is willing to proceed.  CKD stage IIIb with AKI: Baseline creatinine appears around 1.4-1.6, elevated at 2.5 on admission, has improved to 1.5 with IV fluids --Follow BMET  Hypertension: blood pressures actually soft on admission.  -- home ARB/HCTZ held with AKI -- continue coreg 3.125mg  BID  Hyperlipidemia: reports hx but does not appear she's been on meds -- LDL 96 -- continue lipitor 80mg  daily started on admission   Hypokalemia: K+ 2.9 on admission, improved with supplement -- on combo HCTZ PTA, would avoid resumption  Tobacco abuse: cessation advised   For questions or updates, please contact [place] Please consult [email or web address] for contact info under    Signed, Reino Bellis, NP  09/16/2021 10:02 AM  I have personally seen and examined this patient. I agree with the assessment and plan as outlined above. 41 yo female with history of HTN and tobacco abuse admitted with dyspnea and fatigue. EKG with diffuse T wave inversions. Troponin is elevated over 1000.  She is not currently having chest pain. Echo my my review shows preserved LV systolic function with LVH. (Formal read is pending). Creatinine baseline around 1.5. Her creatinine yesterday was 2.5 and today is down to 1.5.    My exam:  General: Well developed, well nourished, NAD  HEENT: OP clear, mucus membranes moist  SKIN: warm, dry. No rashes. Neuro: No focal deficits  Musculoskeletal: Muscle strength 5/5 all ext  Psychiatric: Mood and affect normal  Neck: No JVD Lungs:Clear bilaterally, no wheezes, rhonci, crackles Cardiovascular: Regular rate and rhythm. No murmurs, gallops or rubs. Abdomen:Soft. Bowel sounds present. Non-tender.  Extremities: No lower extremity edema. Pulses are 2 + in the bilateral DP/PT.  NSTEMI: Cardiac cath is indicated. She agrees to proceed.  I have reviewed the risks, indications, and alternatives to cardiac catheterization, possible angioplasty, and stenting with the patient. Risks include but are not limited to bleeding, infection, vascular injury, stroke, myocardial infection, arrhythmia, kidney injury, radiation-related injury in the case of prolonged fluoroscopy use, emergency cardiac surgery, and death. The patient understands the risks of serious complication is 1-2 in [id number] with diagnostic cardiac cath and 1-2% or less with angioplasty/stenting. She has had lunch today so will plan cardiac cath tomorrow. NPO at midnight. Repeat BMET in the morning. Continue IV heparin, ASA, statin and beta blocker.   Lauree Chandler, MD 09/16/2021 11:57 AM

## 2021-09-16 NOTE — Progress Notes (Signed)
PROGRESS NOTE        PATIENT DETAILS Name: [name] Age: 41 y.o. Sex: female Date of Birth: [birth year] Admit Date: 09/15/2021 Admitting Physician Jani Gravel, MD [id number], [facility]  Brief Summary: Patient is a 41 y.o.  female with history of HTN, tobacco use-who presented with several weeks history of fatigue-but for the past 1 week or so-has been getting exertional dyspnea as well.  She presented to the [facility] ED where she was found to have non-STEMI and transferred to the hospitalist service at [facility] for further evaluation.   Significant events: 7/31>> admit-fatigue times several weeks-exertional dyspnea x1 week-elevated cardiac enzymes.  Significant studies: 7/31>> CT chest/abdomen/pelvis: No acute intrathoracic/intra-abdominal/intrapelvic abnormalities.  Significant microbiology data: 7/31>> blood culture: No growth  Procedures: None  Consults: Cardiology  Subjective: Lying comfortably in bed-denies any chest pain or shortness of breath.  Objective: Vitals: Blood pressure 132/80, pulse 67, temperature 97.8 F (36.6 C), temperature source Oral, resp. rate (!) 9, height 5\' 3"  (1.6 m), weight 67.4 kg, last menstrual period 09/11/2021, SpO2 100 %.   Exam: Gen Exam:Alert awake-not in any distress HEENT:atraumatic, normocephalic Chest: B/L clear to auscultation anteriorly CVS:S1S2 regular Abdomen:soft non tender, non distended Extremities:no edema Neurology: Non focal Skin: no rash  Pertinent Labs/Radiology:    Latest Ref Rng & Units 09/16/2021    3:31 AM 09/15/2021    3:14 PM 09/15/2021    2:29 PM  CBC  WBC 4.0 - 10.5 K/uL 12.5   17.1   Hemoglobin 12.0 - 15.0 g/dL 11.4  15.6  14.3   Hematocrit 36.0 - 46.0 % 34.8  46.0  43.4   Platelets 150 - 400 K/uL 326   392     Lab Results  Component Value Date   NA 141 09/16/2021   K 3.7 09/16/2021   CL 115 (H) 09/16/2021   CO2 21 (L) 09/16/2021       Assessment/Plan: Non-STEMI: Presenting with exertional dyspnea x1 week-no chest pain-continue IV heparin/aspirin/statin/beta-blocker-await echo and cardiology evaluation.  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  Creatinine improving with gentle IVF hydration.  Olmesartan/HCTZ on hold.  Hypokalemia: Repleted.  Asymptomatic bacteriuria: Does not have any signs of UTI-suspect asymptomatic bacteriuria.  Fatigue: Ongoing for the past several weeks-since her recent laparoscopic bilateral salpingectomy in 5/15.  Await echo-not very anemic-TSH normal.  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  Check a.m. cortisol.  Tobacco abuse: Counseled.  BMI: Estimated body mass index is 26.32 kg/m as calculated from the following:   Height as of this encounter: 5\' 3"  (1.6 m).   Weight as of this encounter: 67.4 kg.   Code status:   Code Status: Full Code   DVT Prophylaxis: SCDs Start: 09/15/21 1841 IV heparin  Family Communication: None at bedside   Disposition Plan: Status is: Inpatient Remains inpatient appropriate because: Non-STEMI-work-up in progress-not yet stable for discharge.  See above.   Planned Discharge Destination:Home   Diet: Diet Order             Diet NPO time specified Except for: Sips with Meds  Diet effective now  Antimicrobial agents: Anti-infectives (From admission, onward)    Start     Dose/Rate Route Frequency Ordered Stop   09/16/21 2000  cefTRIAXone (ROCEPHIN) 1 g in sodium chloride 0.9 % 100 mL IVPB        1 g 200 mL/hr over 30 Minutes Intravenous Every 24 hours 09/15/21 2015     09/15/21 2200  nitrofurantoin (macrocrystal-monohydrate) (MACROBID) capsule 100 mg  Status:  Discontinued        100 mg Oral 2 times daily 09/15/21 1909 09/15/21 1909   09/15/21 1915   cefTRIAXone (ROCEPHIN) 1 g in sodium chloride 0.9 % 100 mL IVPB        1 g 200 mL/hr over 30 Minutes Intravenous  Once 09/15/21 1913 09/15/21 2012        MEDICATIONS: Scheduled Meds:  aspirin EC  81 mg Oral Daily   atorvastatin  80 mg Oral Daily   carvedilol  3.125 mg Oral BID WC   Continuous Infusions:  sodium chloride 100 mL/hr at 09/16/21 0739   cefTRIAXone (ROCEPHIN)  IV     heparin 1,000 Units/hr (09/16/21 0619)   PRN Meds:.acetaminophen, nitroGLYCERIN, ondansetron (ZOFRAN) IV   I have personally reviewed following labs and imaging studies  LABORATORY DATA: CBC: Recent Labs  Lab 09/15/21 1429 09/15/21 1514 09/16/21 0331  WBC 17.1*  --  12.5*  NEUTROABS 12.2*  --  5.4  HGB 14.3 15.6* 11.4*  HCT 43.4 46.0 34.8*  MCV 83.5  --  84.9  PLT 392  --  [id number]    Basic Metabolic Panel: Recent Labs  Lab 09/15/21 1429 09/15/21 1514 09/15/21 1515 09/16/21 0326 09/16/21 0331  NA 137 140  --  142 141  K 2.9* 2.8*  --  3.4* 3.7  CL 107 106  --  116* 115*  CO2 21*  --   --  23 21*  GLUCOSE 128* 129*  --  101* 101*  BUN 40* 38*  --  30* 32*  CREATININE 2.29* 2.50* 2.50* 1.55* 1.56*  CALCIUM 9.3  --   --  8.1* 8.2*  MG 2.4  --   --   --  2.0    GFR: Estimated Creatinine Clearance: 43.8 mL/min (A) (by C-G formula based on SCr of 1.56 mg/dL (H)).  Liver Function Tests: Recent Labs  Lab 09/15/21 1429 09/16/21 0326  AST 41 43*  ALT 34 27  ALKPHOS 88 66  BILITOT 0.4 0.3  PROT 8.2* 5.7*  ALBUMIN 3.8 2.4*   No results for input(s): "LIPASE", "AMYLASE" in the last 168 hours. No results for input(s): "AMMONIA" in the last 168 hours.  Coagulation Profile: Recent Labs  Lab 09/15/21 1429  INR 1.0    Cardiac Enzymes: Recent Labs  Lab 09/15/21 2226  CKTOTAL 513*  CKMB 43.4*    BNP (last 3 results) No results for input(s): "PROBNP" in the last 8760 hours.  Lipid Profile: Recent Labs    09/16/21 0326  CHOL 154  HDL 22*  LDLCALC 96  TRIG 181*   CHOLHDL 7.0    Thyroid Function Tests: Recent Labs    09/16/21 0326  TSH 2.063    Anemia Panel: No results for input(s): "VITAMINB12", "FOLATE", "FERRITIN", "TIBC", "IRON", "RETICCTPCT" in the last 72 hours.  Urine analysis:    Component Value Date/Time   COLORURINE YELLOW 09/15/2021 1720   APPEARANCEUR HAZY (A) 09/15/2021 1720   LABSPEC 1.011 09/15/2021 1720   PHURINE 6.0 09/15/2021 1720   GLUCOSEU NEGATIVE 09/15/2021 1720   HGBUR LARGE (  A) 09/15/2021 1720   BILIRUBINUR NEGATIVE 09/15/2021 1720   KETONESUR NEGATIVE 09/15/2021 1720   PROTEINUR NEGATIVE 09/15/2021 1720   UROBILINOGEN 0.2 01/02/2013 2040   NITRITE POSITIVE (A) 09/15/2021 1720   LEUKOCYTESUR MODERATE (A) 09/15/2021 1720    Sepsis Labs: Lactic Acid, Venous    Component Value Date/Time   LATICACIDVEN 1.1 09/15/2021 1613    MICROBIOLOGY: Recent Results (from the past 240 hour(s))  Blood Culture (routine x 2)     Status: None (Preliminary result)   Collection Time: 09/15/21  2:29 PM   Specimen: Left Antecubital; Blood  Result Value Ref Range Status   Specimen Description   Final    LEFT ANTECUBITAL BOTTLES DRAWN AEROBIC AND ANAEROBIC   Special Requests Blood Culture adequate volume  Final   Culture   Final    NO GROWTH < 24 HOURS Performed at [facility], [address], [place], [place] [address]    Report Status PENDING  Incomplete  Blood Culture (routine x 2)     Status: None (Preliminary result)   Collection Time: 09/15/21  2:59 PM   Specimen: Right Antecubital; Blood  Result Value Ref Range Status   Specimen Description   Final    RIGHT ANTECUBITAL BOTTLES DRAWN AEROBIC AND ANAEROBIC   Special Requests Blood Culture adequate volume  Final   Culture   Final    NO GROWTH < 24 HOURS Performed at [facility], [address] [place], [place] [address]    Report Status PENDING  Incomplete    RADIOLOGY STUDIES/RESULTS: CT CHEST ABDOMEN PELVIS WO CONTRAST  Result Date: 09/15/2021 CLINICAL  DATA:  Sepsis, shortness of breath, fatigue, blurred vision, aching, history of hypertension, LEFT renal atrophy EXAM: CT CHEST, ABDOMEN AND PELVIS WITHOUT CONTRAST TECHNIQUE: Multidetector CT imaging of the chest, abdomen and pelvis was performed following the standard protocol without IV contrast. RADIATION DOSE REDUCTION: This exam was performed according to the departmental dose-optimization program which includes automated exposure control, adjustment of the mA and/or kV according to patient size and/or use of iterative reconstruction technique. COMPARISON:  CT abdomen and pelvis 05/06/2021 FINDINGS: CT CHEST FINDINGS Cardiovascular: Aorta normal caliber. Heart unremarkable. No pericardial effusion. Mediastinum/Nodes: Esophagus normal appearance. Base of cervical region unremarkable. No thoracic adenopathy. Lungs/Pleura: Mild dependent atelectasis in LEFT lower lobe. Remaining lungs clear. No infiltrate, pleural effusion, pneumothorax, or mass. Musculoskeletal: Unremarkable CT ABDOMEN PELVIS FINDINGS Hepatobiliary: Gallbladder and liver normal appearance Pancreas: Normal appearance Spleen: Normal appearance Adrenals/Urinary Tract: Adrenal glands normal appearance. Atrophic LEFT kidney with chronic mild hydronephrosis. Tiny calculus inferior pole LEFT kidney. RIGHT hydronephrosis again identified though no definite ureteral calcification or dilatation is seen. Mild diffuse bladder wall thickening. No renal masses. Stomach/Bowel: Appendix surgically absent by history. Stomach and bowel loops normal appearance. Vascular/Lymphatic: Aorta normal caliber.  No adenopathy. Reproductive: Uterus and ovaries normal appearance. Tampon in vagina. Other: No free air or free fluid. No hernia or inflammatory process. Musculoskeletal: Unremarkable IMPRESSION: Mild dependent atelectasis in LEFT lower lobe. Atrophic LEFT kidney with chronic mild hydronephrosis. RIGHT hydronephrosis again identified though no definite ureteral  calcification or dilatation is seen. Tiny nonobstructing inferior pole LEFT renal calculus. Mild chronic bladder wall thickening question cystitis, recommend correlation with urinalysis. No acute intrathoracic, intra-abdominal, or intrapelvic abnormalities. Electronically Signed   By: Ulyses Southward M.D.   On: 09/15/2021 18:09   DG Chest Port 1 View  Result Date: 09/15/2021 CLINICAL DATA:  Shortness of breath since last Thursday EXAM: PORTABLE CHEST 1 VIEW COMPARISON:  Chest  radiograph 07/22/2019 FINDINGS: Cardiomediastinal silhouette is normal. There is no focal consolidation or pulmonary edema. There is no pleural effusion or pneumothorax. A probable calcified granuloma in the right upper lobe is unchanged. There is no acute osseous abnormality. IMPRESSION: No radiographic evidence of acute cardiopulmonary process. Electronically Signed   By: Lesia Hausen M.D.   On: 09/15/2021 14:55     LOS: 1 day   Jeoffrey Massed, MD  [facility]    To contact the attending provider between 7A-7P or the covering provider during after hours 7P-7A, please log into the web site [email or web address] and access using universal [place] password for that web site. If you do not have the password, please call the hospital operator.  09/16/2021, 9:48 AM

## 2021-09-16 NOTE — Progress Notes (Signed)
RN called to room by tech that patient was having cp, on assessment patient rated pain at 10/10, one nitro given, pain down to 7/10, 2nd nitro given pain, patient rated pain 5/10 but refuse a 3rd nitro d/t headache. Vitals stable, Ekg done unchanged from previous, Laverda Page NP made aware of above.

## 2021-09-17 ENCOUNTER — Encounter (HOSPITAL_COMMUNITY)
Admission: EM | Disposition: A | Discharge status: Home / Self Care | Payer: Self-pay | Source: Home / Self Care | Attending: Internal Medicine

## 2021-09-17 DIAGNOSIS — I214 Non-ST elevation (NSTEMI) myocardial infarction: Secondary | ICD-10-CM | POA: Diagnosis not present

## 2021-09-17 DIAGNOSIS — R778 Other specified abnormalities of plasma proteins: Secondary | ICD-10-CM | POA: Diagnosis not present

## 2021-09-17 DIAGNOSIS — N179 Acute kidney failure, unspecified: Secondary | ICD-10-CM | POA: Diagnosis not present

## 2021-09-17 DIAGNOSIS — N12 Tubulo-interstitial nephritis, not specified as acute or chronic: Secondary | ICD-10-CM | POA: Diagnosis not present

## 2021-09-17 DIAGNOSIS — I10 Essential (primary) hypertension: Secondary | ICD-10-CM | POA: Diagnosis not present

## 2021-09-17 HISTORY — PX: LEFT HEART CATH AND CORONARY ANGIOGRAPHY: CATH118249

## 2021-09-17 LAB — CK: Total CK: 211 U/L (ref 38–234)

## 2021-09-17 LAB — CBC
HCT: 34.4 % — ABNORMAL LOW (ref 36.0–46.0)
Hemoglobin: 11.1 g/dL — ABNORMAL LOW (ref 12.0–15.0)
MCH: 28 pg (ref 26.0–34.0)
MCHC: 32.3 g/dL (ref 30.0–36.0)
MCV: 86.9 fL (ref 80.0–100.0)
Platelets: 271 10*3/uL (ref 150–400)
RBC: 3.96 MIL/uL (ref 3.87–5.11)
RDW: 14 % (ref 11.5–15.5)
WBC: 10.6 10*3/uL — ABNORMAL HIGH (ref 4.0–10.5)
nRBC: 0 % (ref 0.0–0.2)

## 2021-09-17 LAB — COMPREHENSIVE METABOLIC PANEL
ALT: 31 U/L (ref 0–44)
AST: 37 U/L (ref 15–41)
Albumin: 2.4 g/dL — ABNORMAL LOW (ref 3.5–5.0)
Alkaline Phosphatase: 61 U/L (ref 38–126)
Anion gap: 4 — ABNORMAL LOW (ref 5–15)
BUN: 21 mg/dL — ABNORMAL HIGH (ref 6–20)
CO2: 22 mmol/L (ref 22–32)
Calcium: 8.2 mg/dL — ABNORMAL LOW (ref 8.9–10.3)
Chloride: 115 mmol/L — ABNORMAL HIGH (ref 98–111)
Creatinine, Ser: 1.24 mg/dL — ABNORMAL HIGH (ref 0.44–1.00)
GFR, Estimated: 56 mL/min — ABNORMAL LOW (ref >60)
Glucose, Bld: 101 mg/dL — ABNORMAL HIGH (ref 70–99)
Potassium: 3.6 mmol/L (ref 3.5–5.1)
Sodium: 141 mmol/L (ref 135–145)
Total Bilirubin: 0.3 mg/dL (ref 0.3–1.2)
Total Protein: 5.5 g/dL — ABNORMAL LOW (ref 6.5–8.1)

## 2021-09-17 LAB — HEPARIN LEVEL (UNFRACTIONATED): Heparin Unfractionated: 0.27 IU/mL — ABNORMAL LOW (ref 0.30–0.70)

## 2021-09-17 LAB — CORTISOL: Cortisol, Plasma: 2.9 ug/dL

## 2021-09-17 LAB — URINE CULTURE

## 2021-09-17 LAB — LIPOPROTEIN A (LPA): Lipoprotein (a): 97.1 nmol/L — ABNORMAL HIGH (ref <75.0)

## 2021-09-17 SURGERY — LEFT HEART CATH AND CORONARY ANGIOGRAPHY
Anesthesia: LOCAL

## 2021-09-17 MED ORDER — HEPARIN SODIUM (PORCINE) 1000 UNIT/ML IJ SOLN
INTRAMUSCULAR | Status: AC
  Filled 2021-09-17: qty 10

## 2021-09-17 MED ORDER — HEPARIN (PORCINE) IN NACL 1000-0.9 UT/500ML-% IV SOLN
INTRAVENOUS | Status: AC
  Filled 2021-09-17: qty 1000

## 2021-09-17 MED ORDER — LOSARTAN POTASSIUM 25 MG PO TABS
12.5000 mg | ORAL_TABLET | Freq: Every day | ORAL | Status: DC
  Administered 2021-09-17: 12.5 mg via ORAL
  Filled 2021-09-17: qty 1

## 2021-09-17 MED ORDER — MIDAZOLAM HCL 2 MG/2ML IJ SOLN
INTRAMUSCULAR | Status: AC
  Filled 2021-09-17: qty 2

## 2021-09-17 MED ORDER — ACETAMINOPHEN 325 MG PO TABS: 650.0000 mg | ORAL_TABLET | ORAL | Status: DC | PRN

## 2021-09-17 MED ORDER — COSYNTROPIN 0.25 MG IJ SOLR
0.2500 mg | Freq: Once | INTRAMUSCULAR | Status: AC
  Administered 2021-09-18: 0.25 mg via INTRAVENOUS
  Filled 2021-09-17: qty 0.25

## 2021-09-17 MED ORDER — FENTANYL CITRATE (PF) 100 MCG/2ML IJ SOLN
INTRAMUSCULAR | Status: AC
  Filled 2021-09-17: qty 2

## 2021-09-17 MED ORDER — FENTANYL CITRATE (PF) 100 MCG/2ML IJ SOLN
INTRAMUSCULAR | Status: DC | PRN
  Administered 2021-09-17 (×2): 25 ug via INTRAVENOUS

## 2021-09-17 MED ORDER — LABETALOL HCL 5 MG/ML IV SOLN: 10.0000 mg | INTRAVENOUS | Status: AC | PRN

## 2021-09-17 MED ORDER — SODIUM CHLORIDE 0.9% FLUSH
3.0000 mL | Freq: Two times a day (BID) | INTRAVENOUS | Status: DC
  Administered 2021-09-17 – 2021-09-18 (×3): 3 mL via INTRAVENOUS

## 2021-09-17 MED ORDER — VERAPAMIL HCL 2.5 MG/ML IV SOLN
INTRAVENOUS | Status: AC
  Filled 2021-09-17: qty 2

## 2021-09-17 MED ORDER — SODIUM CHLORIDE 0.9 % IV SOLN: 250.0000 mL | INTRAVENOUS | Status: DC | PRN

## 2021-09-17 MED ORDER — EMPAGLIFLOZIN 10 MG PO TABS
10.0000 mg | ORAL_TABLET | Freq: Every day | ORAL | Status: DC
  Administered 2021-09-17 – 2021-09-18 (×2): 10 mg via ORAL
  Filled 2021-09-17 (×2): qty 1

## 2021-09-17 MED ORDER — HEPARIN (PORCINE) IN NACL 1000-0.9 UT/500ML-% IV SOLN
INTRAVENOUS | Status: DC | PRN
  Administered 2021-09-17 (×2): 500 mL

## 2021-09-17 MED ORDER — CARVEDILOL 6.25 MG PO TABS
6.2500 mg | ORAL_TABLET | Freq: Two times a day (BID) | ORAL | Status: DC
  Administered 2021-09-17 – 2021-09-18 (×2): 6.25 mg via ORAL
  Filled 2021-09-17 (×2): qty 1

## 2021-09-17 MED ORDER — VERAPAMIL HCL 2.5 MG/ML IV SOLN: INTRAVENOUS | Status: DC | PRN

## 2021-09-17 MED ORDER — HYDRALAZINE HCL 20 MG/ML IJ SOLN: 10.0000 mg | Freq: Four times a day (QID) | INTRAMUSCULAR | Status: DC | PRN

## 2021-09-17 MED ORDER — HYDRALAZINE HCL 20 MG/ML IJ SOLN: 10.0000 mg | INTRAMUSCULAR | Status: AC | PRN

## 2021-09-17 MED ORDER — LIDOCAINE HCL (PF) 1 % IJ SOLN
INTRAMUSCULAR | Status: DC | PRN
  Administered 2021-09-17: 2 mL

## 2021-09-17 MED ORDER — IOHEXOL 350 MG/ML SOLN
INTRAVENOUS | Status: DC | PRN
  Administered 2021-09-17: 50 mL

## 2021-09-17 MED ORDER — ONDANSETRON HCL 4 MG/2ML IJ SOLN: 4.0000 mg | Freq: Four times a day (QID) | INTRAMUSCULAR | Status: DC | PRN

## 2021-09-17 MED ORDER — LIDOCAINE HCL (PF) 1 % IJ SOLN
INTRAMUSCULAR | Status: AC
  Filled 2021-09-17: qty 30

## 2021-09-17 MED ORDER — MIDAZOLAM HCL 2 MG/2ML IJ SOLN
INTRAMUSCULAR | Status: DC | PRN
  Administered 2021-09-17: 1 mg via INTRAVENOUS
  Administered 2021-09-17: 2 mg via INTRAVENOUS

## 2021-09-17 MED ORDER — SODIUM CHLORIDE 0.9% FLUSH: 3.0000 mL | INTRAVENOUS | Status: DC | PRN

## 2021-09-17 MED ORDER — HEPARIN SODIUM (PORCINE) 1000 UNIT/ML IJ SOLN
INTRAMUSCULAR | Status: DC | PRN
  Administered 2021-09-17: 3500 [IU] via INTRAVENOUS

## 2021-09-17 SURGICAL SUPPLY — 14 items
BAND CMPR LRG ZPHR (HEMOSTASIS) ×1
BAND ZEPHYR COMPRESS 30 LONG (HEMOSTASIS) ×1 IMPLANT
CATH 5FR JL3.5 JR4 ANG PIG MP (CATHETERS) ×1 IMPLANT
CATH LAUNCHER 5F JL3 (CATHETERS) IMPLANT
CATHETER LAUNCHER 5F JL3 (CATHETERS) ×2
GLIDESHEATH SLEND SS 6F .021 (SHEATH) ×1 IMPLANT
GUIDEWIRE INQWIRE 1.5J.035X260 (WIRE) IMPLANT
INQWIRE 1.5J .035X260CM (WIRE) ×2
KIT HEART LEFT (KITS) ×3 IMPLANT
PACK CARDIAC CATHETERIZATION (CUSTOM PROCEDURE TRAY) ×3 IMPLANT
SHEATH PROBE COVER 6X72 (BAG) ×1 IMPLANT
SYR MEDRAD MARK 7 150ML (SYRINGE) ×3 IMPLANT
TRANSDUCER W/STOPCOCK (MISCELLANEOUS) ×3 IMPLANT
TUBING CIL FLEX 10 FLL-RA (TUBING) ×3 IMPLANT

## 2021-09-17 NOTE — Interval H&P Note (Signed)
Cath Lab Visit (complete for each Cath Lab visit)  Clinical Evaluation Leading to the Procedure:   ACS: Yes.    Non-ACS:    Anginal Classification: CCS IV  Anti-ischemic medical therapy: Minimal Therapy (1 class of medications)  Non-Invasive Test Results: No non-invasive testing performed  Prior CABG: No previous CABG      History and Physical Interval Note:  09/17/2021 1:35 PM  [name]  has presented today for surgery, with the diagnosis of nstemi.  The various methods of treatment have been discussed with the patient and family. After consideration of risks, benefits and other options for treatment, the patient has consented to  Procedure(s): LEFT HEART CATH AND CORONARY ANGIOGRAPHY (N/A) as a surgical intervention.  The patient's history has been reviewed, patient examined, no change in status, stable for surgery.  I have reviewed the patient's chart and labs.  Questions were answered to the patient's satisfaction.     Lance Muss

## 2021-09-17 NOTE — H&P (View-Only) (Signed)
Progress Note  Patient Name: [name] Date of Encounter: 09/17/2021  [facility] Cardiologist: Angelena Form  Subjective   No acute events overnight.  Denies chest pain or dyspnea.  Inpatient Medications    Scheduled Meds:  aspirin EC  81 mg Oral Daily   atorvastatin  80 mg Oral Daily   carvedilol  3.125 mg Oral BID WC   [START ON 09/18/2021] cosyntropin  0.25 mg Intravenous Once   sodium chloride flush  3 mL Intravenous Q12H   Continuous Infusions:  sodium chloride     sodium chloride 1 mL/kg/hr (09/17/21 0505)   cefTRIAXone (ROCEPHIN)  IV 1 g (09/16/21 2032)   heparin 1,250 Units/hr (09/17/21 0434)   PRN Meds: sodium chloride, acetaminophen, nitroGLYCERIN, ondansetron (ZOFRAN) IV, sodium chloride flush   Vital Signs    Vitals:   09/16/21 2200 09/16/21 2300 09/17/21 0100 09/17/21 0419  BP:  (!) 155/83  (!) 155/80  Pulse:  82    Resp: 20  16 20   Temp:    98.5 F (36.9 C)  TempSrc:    Oral  SpO2:  100%    Weight:    69.7 kg  Height:        Intake/Output Summary (Last 24 hours) at 09/17/2021 0849 Last data filed at 09/17/2021 0420 Gross per 24 hour  Intake 720 ml  Output 300 ml  Net 420 ml      09/17/2021    4:19 AM 09/16/2021    2:56 AM 09/15/2021    9:25 PM  Last 3 Weights  Weight (lbs) 153 lb 11.2 oz 148 lb 9.6 oz 148 lb 14.4 oz  Weight (kg) 69.718 kg 67.405 kg 67.541 kg      Telemetry    SR- Personally Reviewed  ECG    SR with LVH- Personally Reviewed  Physical Exam   GEN: No acute distress.   Neck: No JVD Cardiac: RRR, no murmurs, rubs, or gallops.  Respiratory: Clear to auscultation bilaterally. GI: Soft, nontender, non-distended  MS: No edema; No deformity. Neuro:  Nonfocal  Psych: Normal affect   Labs    High Sensitivity Troponin:   Recent Labs  Lab 09/15/21 1429 09/15/21 1613 09/15/21 2226 09/16/21 0326  TROPONINIHS 253* 419* 1,197* 1,049*     Chemistry Recent Labs  Lab 09/15/21 1429 09/15/21 1514 09/16/21 0326  09/16/21 0331 09/17/21 0249  NA 137   < > 142 141 141  K 2.9*   < > 3.4* 3.7 3.6  CL 107   < > 116* 115* 115*  CO2 21*  --  23 21* 22  GLUCOSE 128*   < > 101* 101* 101*  BUN 40*   < > 30* 32* 21*  CREATININE 2.29*   < > 1.55* 1.56* 1.24*  CALCIUM 9.3  --  8.1* 8.2* 8.2*  MG 2.4  --   --  2.0  --   PROT 8.2*  --  5.7*  --  5.5*  ALBUMIN 3.8  --  2.4*  --  2.4*  AST 41  --  43*  --  37  ALT 34  --  27  --  31  ALKPHOS 88  --  66  --  61  BILITOT 0.4  --  0.3  --  0.3  GFRNONAA 27*  --  43* 43* 56*  ANIONGAP 9  --  3* 5 4*   < > = values in this interval not displayed.    Lipids  Recent Labs  Lab  09/16/21 0326  CHOL 154  TRIG 181*  HDL 22*  LDLCALC 96  CHOLHDL 7.0    Hematology Recent Labs  Lab 09/15/21 1429 09/15/21 1514 09/16/21 0331 09/17/21 0249  WBC 17.1*  --  12.5* 10.6*  RBC 5.20*  --  4.10 3.96  HGB 14.3 15.6* 11.4* 11.1*  HCT 43.4 46.0 34.8* 34.4*  MCV 83.5  --  84.9 86.9  MCH 27.5  --  27.8 28.0  MCHC 32.9  --  32.8 32.3  RDW 13.7  --  13.9 14.0  PLT 392  --  326 271   Thyroid  Recent Labs  Lab 09/16/21 0326  TSH 2.063    BNPNo results for input(s): "BNP", "PROBNP" in the last 168 hours.  DDimer  Recent Labs  Lab 09/15/21 1429  DDIMER 0.49     Radiology    ECHOCARDIOGRAM COMPLETE  Result Date: 09/16/2021    ECHOCARDIOGRAM REPORT   Patient Name:   [name] Date of Exam: 09/16/2021 Medical Rec #:  [id number]        Height:       63.0 in Accession #:    [id number]       Weight:       148.6 lb Date of Birth:  Jul 22, 1980        BSA:          1.704 m Patient Age:    41 years         BP:           132/80 mmHg Patient Gender: F                HR:           65 bpm. Exam Location:  Inpatient Procedure: Cardiac Doppler, Color Doppler and Intracardiac Opacification Agent Indications:    NSTEMI  History:        Patient has no prior history of Echocardiogram examinations.                 Angina; Signs/Symptoms:Dyspnea. NSTEMI.  Sonographer:    Melissa  Morford RDCS (AE, PE) Referring Phys: [id number] JAMES KIM IMPRESSIONS  1. Left ventricular ejection fraction, by estimation, is 65 to 70%. The left ventricle has normal function. The left ventricle has no regional wall motion abnormalities. There is severe concentric left ventricular hypertrophy, best seen in PSAX views. Left ventricular diastolic parameters are consistent with Grade II diastolic dysfunction (pseudonormalization). No evidence of left apical aneurysm. 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  2. Right ventricular systolic function is normal. The right ventricular size is normal. There is normal pulmonary artery systolic pressure.  3. The mitral valve is normal in structure. No evidence of mitral valve regurgitation. No evidence of mitral stenosis.  4. The aortic valve was not well visualized. Aortic valve regurgitation is not visualized. No aortic stenosis is present. Conclusion(s)/Recommendation(s): If negative ischemic work up, cardiac MRI may be considered to assess for etiology of hypertrophy. FINDINGS  Left Ventricle: Left ventricular ejection fraction, by estimation, is 65 to 70%. The left ventricle has normal function. The left ventricle has no regional wall motion abnormalities. The left ventricular internal cavity size was normal in size. There is  severe concentric left ventricular hypertrophy. Left ventricular diastolic parameters are consistent with Grade II diastolic dysfunction (pseudonormalization). Right Ventricle: The right ventricular size is normal. No increase in right ventricular wall thickness. Right ventricular systolic function is normal. There is normal pulmonary artery systolic pressure. The tricuspid regurgitant  velocity is 2.48 m/s, and  with an assumed right atrial pressure of 3 mmHg, the estimated right ventricular systolic pressure is 27.6 mmHg. Left Atrium: Left atrial size was normal in size. Right Atrium: Right atrial size was normal in size.  Pericardium: There is no evidence of pericardial effusion. Mitral Valve: The mitral valve is normal in structure. No evidence of mitral valve regurgitation. No evidence of mitral valve stenosis. Tricuspid Valve: The tricuspid valve is normal in structure. Tricuspid valve regurgitation is not demonstrated. No evidence of tricuspid stenosis. Aortic Valve: The aortic valve was not well visualized. Aortic valve regurgitation is not visualized. No aortic stenosis is present. Pulmonic Valve: The pulmonic valve was normal in structure. Pulmonic valve regurgitation is not visualized. No evidence of pulmonic stenosis. Aorta: The aortic root and ascending aorta are structurally normal, with no evidence of dilitation. IAS/Shunts: No atrial level shunt detected by color flow Doppler.  LEFT VENTRICLE PLAX 2D LVOT diam:     1.90 cm   Diastology LV SV:         68        LV e' medial:    6.20 cm/s LV SV Index:   40        LV E/e' medial:  14.1 LVOT Area:     2.84 cm  LV e' lateral:   4.90 cm/s                          LV E/e' lateral: 17.8  RIGHT VENTRICLE RV S prime:     12.50 cm/s TAPSE (M-mode): 1.4 cm LEFT ATRIUM             Index        RIGHT ATRIUM           Index LA Vol (A2C):   28.8 ml 16.90 ml/m  RA Area:     11.00 cm LA Vol (A4C):   33.4 ml 19.60 ml/m  RA Volume:   22.10 ml  12.97 ml/m LA Biplane Vol: 33.2 ml 19.48 ml/m  AORTIC VALVE LVOT Vmax:   112.00 cm/s LVOT Vmean:  83.100 cm/s LVOT VTI:    0.241 m  AORTA Ao Asc diam: 2.65 cm MITRAL VALVE               TRICUSPID VALVE MV Area (PHT): 3.93 cm    TR Peak grad:   24.6 mmHg MV Decel Time: 193 msec    TR Vmax:        248.00 cm/s MV E velocity: 87.40 cm/s MV A velocity: 73.70 cm/s  SHUNTS MV E/A ratio:  1.19        Systemic VTI:  0.24 m                            Systemic Diam: 1.90 cm Riley Lam MD Electronically signed by Riley Lam MD Signature Date/Time: 09/16/2021/1:10:54 PM    Final    CT CHEST ABDOMEN PELVIS WO CONTRAST  Result Date:  09/15/2021 CLINICAL DATA:  Sepsis, shortness of breath, fatigue, blurred vision, aching, history of hypertension, LEFT renal atrophy EXAM: CT CHEST, ABDOMEN AND PELVIS WITHOUT CONTRAST TECHNIQUE: Multidetector CT imaging of the chest, abdomen and pelvis was performed following the standard protocol without IV contrast. RADIATION DOSE REDUCTION: This exam was performed according to the departmental dose-optimization program which includes automated exposure control, adjustment of the mA and/or kV according to patient size  and/or use of iterative reconstruction technique. COMPARISON:  CT abdomen and pelvis 05/06/2021 FINDINGS: CT CHEST FINDINGS Cardiovascular: Aorta normal caliber. Heart unremarkable. No pericardial effusion. Mediastinum/Nodes: Esophagus normal appearance. Base of cervical region unremarkable. No thoracic adenopathy. Lungs/Pleura: Mild dependent atelectasis in LEFT lower lobe. Remaining lungs clear. No infiltrate, pleural effusion, pneumothorax, or mass. Musculoskeletal: Unremarkable CT ABDOMEN PELVIS FINDINGS Hepatobiliary: Gallbladder and liver normal appearance Pancreas: Normal appearance Spleen: Normal appearance Adrenals/Urinary Tract: Adrenal glands normal appearance. Atrophic LEFT kidney with chronic mild hydronephrosis. Tiny calculus inferior pole LEFT kidney. RIGHT hydronephrosis again identified though no definite ureteral calcification or dilatation is seen. Mild diffuse bladder wall thickening. No renal masses. Stomach/Bowel: Appendix surgically absent by history. Stomach and bowel loops normal appearance. Vascular/Lymphatic: Aorta normal caliber.  No adenopathy. Reproductive: Uterus and ovaries normal appearance. Tampon in vagina. Other: No free air or free fluid. No hernia or inflammatory process. Musculoskeletal: Unremarkable IMPRESSION: Mild dependent atelectasis in LEFT lower lobe. Atrophic LEFT kidney with chronic mild hydronephrosis. RIGHT hydronephrosis again identified though no  definite ureteral calcification or dilatation is seen. Tiny nonobstructing inferior pole LEFT renal calculus. Mild chronic bladder wall thickening question cystitis, recommend correlation with urinalysis. No acute intrathoracic, intra-abdominal, or intrapelvic abnormalities. Electronically Signed   By: Ulyses Southward M.D.   On: 09/15/2021 18:09   DG Chest Port 1 View  Result Date: 09/15/2021 CLINICAL DATA:  Shortness of breath since last Thursday EXAM: PORTABLE CHEST 1 VIEW COMPARISON:  Chest radiograph 07/22/2019 FINDINGS: Cardiomediastinal silhouette is normal. There is no focal consolidation or pulmonary edema. There is no pleural effusion or pneumothorax. A probable calcified granuloma in the right upper lobe is unchanged. There is no acute osseous abnormality. IMPRESSION: No radiographic evidence of acute cardiopulmonary process. Electronically Signed   By: Lesia Hausen M.D.   On: 09/15/2021 14:55    Cardiac Studies   TTE 8/1: 1. Left ventricular ejection fraction, by estimation, is 65 to 70%. The  left ventricle has normal function. The left ventricle has no regional  wall motion abnormalities. There is severe concentric left ventricular  hypertrophy, best seen in PSAX views.  Left ventricular diastolic parameters are consistent with Grade II  diastolic dysfunction (pseudonormalization). No evidence of left apical  aneurysm. 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   2. Right ventricular systolic function is normal. The right ventricular  size is normal. There is normal pulmonary artery systolic pressure.   3. The mitral valve is normal in structure. No evidence of mitral valve  regurgitation. No evidence of mitral stenosis.   4. The aortic valve was not well visualized. Aortic valve regurgitation  is not visualized. No aortic stenosis is present.   Conclusion(s)/Recommendation(s): If negative ischemic work up, cardiac MRI  may be considered to assess for  etiology of hypertrophy.   Patient Profile     41 y.o. female  with a hx of hypertension, anxiety/depression, kidney stones, pyelonephritis, nephrolithiasis who is being seen 09/16/2021 for the evaluation of NSTEMI at the request of Dr. Jerral Ralph.  Assessment & Plan    ACS:  Coronary angiography today and if negative, dyspnea and trop elevation likely due to diastolic dysfunction/LVH.  Consider outpatient CMRI to evaluate if this is the case.  HTN:  Given LVH, patient would benefit from losartan however has CKD.  Consider low dose as outpatient depending on cr.  CKD:  Cr down to 1.2, though fluctuating quite a bit over the last few months.  LVH:  Will start Jardiance as GFR  is ok.     For questions or updates, please contact [facility] Please consult [email or web address] for contact info under        Signed, Orbie Pyo, MD  09/17/2021, 8:49 AM

## 2021-09-17 NOTE — Progress Notes (Signed)
PROGRESS NOTE        PATIENT DETAILS Name: [name] Age: 41 y.o. Sex: female Date of Birth: 10/03/1980 Admit Date: 09/15/2021 Admitting Physician Jani Gravel, MD [id number], [facility]  Brief Summary: Patient is a 41 y.o.  female with history of HTN, tobacco use-who presented with several weeks history of fatigue-but for the past 1 week or so-has been getting exertional dyspnea as well.  She presented to the [facility] ED where she was found to have non-STEMI and transferred to the hospitalist service at [facility] for further evaluation.   Significant events: 7/31>> admit-fatigue times several weeks-exertional dyspnea x1 week-elevated cardiac enzymes.  Significant studies: 7/31>> CT chest/abdomen/pelvis: No acute intrathoracic/intra-abdominal/intrapelvic abnormalities. 8/01>> Echo: EF [id number], grade 2 diastolic dysfunction.  Significant microbiology data: 7/31>> blood culture: No growth 7/31>> urine culture: Multiple organisms.  Procedures: None  Consults: Cardiology  Subjective: Lying comfortably in bed-no chest pain.  Claims to feel better than how she did when she first presented.  Objective: Vitals: Blood pressure (!) 163/99, pulse 75, temperature 98.3 F (36.8 C), temperature source Oral, resp. rate 20, height 5\' 3"  (1.6 m), weight 69.7 kg, last menstrual period 09/11/2021, SpO2 99 %.   Exam: Gen Exam:Alert awake-not in any distress HEENT:atraumatic, normocephalic Chest: B/L clear to auscultation anteriorly CVS:S1S2 regular Abdomen:soft non tender, non distended Extremities:no edema Neurology: Non focal Skin: no rash   Pertinent Labs/Radiology:    Latest Ref Rng & Units 09/17/2021    2:49 AM 09/16/2021    3:31 AM 09/15/2021    3:14 PM  CBC  WBC 4.0 - 10.5 K/uL 10.6  12.5    Hemoglobin 12.0 - 15.0 g/dL 11.1  11.4  15.6   Hematocrit 36.0 - 46.0 % 34.4  34.8  46.0   Platelets 150 - 400 K/uL 271  326      Lab Results  Component  Value Date   NA 141 09/17/2021   K 3.6 09/17/2021   CL 115 (H) 09/17/2021   CO2 22 09/17/2021      Assessment/Plan: Non-STEMI: No chest pain-no shortness of breath this morning-continue   AKI on CKD stage IIIb: AKI hemodynamically mediated in setting of 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  Creatinine improved with supportive care with IVF.  ARB/HCTZ remains on hold.    HTN: BP stable with beta-blocker.  Hypokalemia: Repleted.  Asymptomatic bacteriuria: Does not have any signs of UTI-suspect asymptomatic bacteriuria.  Will discontinue Rocephin.  Fatigue: Ongoing for the past several weeks-since her recent laparoscopic bilateral salpingectomy in 5/15.  Echo stable-TSH normal-no significant anemia.  A.m. cortisol low but not timed appropriately-we will check stim test tomorrow morning.   Tobacco abuse: Counseled.  BMI: Estimated body mass index is 27.23 kg/m as calculated from the following:   Height as of this encounter: 5\' 3"  (1.6 m).   Weight as of this encounter: 69.7 kg.   Code status:   Code Status: Full Code   DVT Prophylaxis: SCDs Start: 09/15/21 1841 IV heparin  Family Communication: None at bedside   Disposition Plan: Status is: Inpatient Remains inpatient appropriate because: Non-STEMI-work-up in progress-not yet stable for discharge.  See above.   Planned Discharge Destination:Home   Diet: Diet Order             Diet NPO time specified Except for: Sips with Meds  Diet effective midnight                     Antimicrobial agents: Anti-infectives (From admission, onward)    Start     Dose/Rate Route Frequency Ordered Stop   09/16/21 2000  [MAR Hold]  cefTRIAXone (ROCEPHIN) 1 g in sodium chloride 0.9 % 100 mL IVPB        (MAR Hold since Wed 09/17/2021 at 1348.Hold Reason: Transfer to a Procedural area)   1 g 200 mL/hr over 30 Minutes Intravenous Every 24 hours 09/15/21 2015     09/15/21 2200   nitrofurantoin (macrocrystal-monohydrate) (MACROBID) capsule 100 mg  Status:  Discontinued        100 mg Oral 2 times daily 09/15/21 1909 09/15/21 1909   09/15/21 1915  cefTRIAXone (ROCEPHIN) 1 g in sodium chloride 0.9 % 100 mL IVPB        1 g 200 mL/hr over 30 Minutes Intravenous  Once 09/15/21 1913 09/15/21 2012        MEDICATIONS: Scheduled Meds:  [MAR Hold] aspirin EC  81 mg Oral Daily   [MAR Hold] atorvastatin  80 mg Oral Daily   [MAR Hold] carvedilol  3.125 mg Oral BID WC   [MAR Hold] cosyntropin  0.25 mg Intravenous Once   [MAR Hold] empagliflozin  10 mg Oral Daily   [MAR Hold] sodium chloride flush  3 mL Intravenous Q12H   Continuous Infusions:  sodium chloride     sodium chloride 1 mL/kg/hr (09/17/21 0920)   [MAR Hold] cefTRIAXone (ROCEPHIN)  IV 1 g (09/16/21 2032)   heparin 1,250 Units/hr (09/17/21 0434)   PRN Meds:.sodium chloride, [MAR Hold] acetaminophen, fentaNYL, Heparin (Porcine) in NaCl, heparin sodium (porcine), lidocaine (PF), midazolam, [MAR Hold] nitroGLYCERIN, [MAR Hold] ondansetron (ZOFRAN) IV, Radial Cocktail/Verapamil only, sodium chloride flush   I have personally reviewed following labs and imaging studies  LABORATORY DATA: CBC: Recent Labs  Lab 09/15/21 1429 09/15/21 1514 09/16/21 0331 09/17/21 0249  WBC 17.1*  --  12.5* 10.6*  NEUTROABS 12.2*  --  5.4  --   HGB 14.3 15.6* 11.4* 11.1*  HCT 43.4 46.0 34.8* 34.4*  MCV 83.5  --  84.9 86.9  PLT 392  --  326 271     Basic Metabolic Panel: Recent Labs  Lab 09/15/21 1429 09/15/21 1514 09/15/21 1515 09/16/21 0326 09/16/21 0331 09/17/21 0249  NA 137 140  --  142 141 141  K 2.9* 2.8*  --  3.4* 3.7 3.6  CL 107 106  --  116* 115* 115*  CO2 21*  --   --  23 21* 22  GLUCOSE 128* 129*  --  101* 101* 101*  BUN 40* 38*  --  30* 32* 21*  CREATININE 2.29* 2.50* 2.50* 1.55* 1.56* 1.24*  CALCIUM 9.3  --   --  8.1* 8.2* 8.2*  MG 2.4  --   --   --  2.0  --      GFR: Estimated Creatinine  Clearance: 55.9 mL/min (A) (by C-G formula based on SCr of 1.24 mg/dL (H)).  Liver Function Tests: Recent Labs  Lab 09/15/21 1429 09/16/21 0326 09/17/21 0249  AST 41 43* 37  ALT 34 27 31  ALKPHOS 88 66 61  BILITOT 0.4 0.3 0.3  PROT 8.2* 5.7* 5.5*  ALBUMIN 3.8 2.4* 2.4*    No results for input(s): "LIPASE", "AMYLASE" in the last 168 hours. No results for input(s): "AMMONIA" in the last 168 hours.  Coagulation Profile: Recent  Labs  Lab 09/15/21 1429  INR 1.0     Cardiac Enzymes: Recent Labs  Lab 09/15/21 2226 09/17/21 0249  CKTOTAL 513* 211  CKMB 43.4*  --      BNP (last 3 results) No results for input(s): "PROBNP" in the last 8760 hours.  Lipid Profile: Recent Labs    09/16/21 0326  CHOL 154  HDL 22*  LDLCALC 96  TRIG 181*  CHOLHDL 7.0     Thyroid Function Tests: Recent Labs    09/16/21 0326  TSH 2.063     Anemia Panel: No results for input(s): "VITAMINB12", "FOLATE", "FERRITIN", "TIBC", "IRON", "RETICCTPCT" in the last 72 hours.  Urine analysis:    Component Value Date/Time   COLORURINE YELLOW 09/15/2021 1720   APPEARANCEUR HAZY (A) 09/15/2021 1720   LABSPEC 1.011 09/15/2021 1720   PHURINE 6.0 09/15/2021 1720   GLUCOSEU NEGATIVE 09/15/2021 1720   HGBUR LARGE (A) 09/15/2021 1720   BILIRUBINUR NEGATIVE 09/15/2021 1720   KETONESUR NEGATIVE 09/15/2021 1720   PROTEINUR NEGATIVE 09/15/2021 1720   UROBILINOGEN 0.2 01/02/2013 2040   NITRITE POSITIVE (A) 09/15/2021 1720   LEUKOCYTESUR MODERATE (A) 09/15/2021 1720    Sepsis Labs: Lactic Acid, Venous    Component Value Date/Time   LATICACIDVEN 1.1 09/15/2021 1613    MICROBIOLOGY: Recent Results (from the past 240 hour(s))  Blood Culture (routine x 2)     Status: None (Preliminary result)   Collection Time: 09/15/21  2:29 PM   Specimen: Left Antecubital; Blood  Result Value Ref Range Status   Specimen Description   Final    LEFT ANTECUBITAL BOTTLES DRAWN AEROBIC AND ANAEROBIC    Special Requests Blood Culture adequate volume  Final   Culture   Final    NO GROWTH 2 DAYS Performed at [facility], [address], [place], [place] [address]    Report Status PENDING  Incomplete  Blood Culture (routine x 2)     Status: None (Preliminary result)   Collection Time: 09/15/21  2:59 PM   Specimen: Right Antecubital; Blood  Result Value Ref Range Status   Specimen Description   Final    RIGHT ANTECUBITAL BOTTLES DRAWN AEROBIC AND ANAEROBIC   Special Requests Blood Culture adequate volume  Final   Culture   Final    NO GROWTH 2 DAYS Performed at [facility], [address], [place], [place] [address]    Report Status PENDING  Incomplete  Urine Culture     Status: Abnormal   Collection Time: 09/15/21  7:14 PM   Specimen: Urine, Clean Catch  Result Value Ref Range Status   Specimen Description   Final    URINE, CLEAN CATCH Performed at [facility], [address] [place], [place] [address]    Special Requests   Final    NONE Performed at [facility], [address] [place], [place] [address]    Culture MULTIPLE SPECIES PRESENT, SUGGEST RECOLLECTION (A)  Final   Report Status 09/17/2021 FINAL  Final    RADIOLOGY STUDIES/RESULTS: ECHOCARDIOGRAM COMPLETE  Result Date: 09/16/2021    ECHOCARDIOGRAM REPORT   Patient Name:   [name] Date of Exam: 09/16/2021 Medical Rec #:  [id number]        Height:       63.0 in Accession #:    [id number]       Weight:       148.6 lb Date of Birth:  1980-07-02        BSA:  1.704 m Patient Age:    41 years         BP:           132/80 mmHg Patient Gender: F                HR:           65 bpm. Exam Location:  Inpatient Procedure: Cardiac Doppler, Color Doppler and Intracardiac Opacification Agent Indications:    NSTEMI  History:        Patient has no prior history of Echocardiogram examinations.                 Angina; Signs/Symptoms:Dyspnea. NSTEMI.  Sonographer:    Melissa Morford RDCS (AE, PE) Referring Phys: [place]  1. Left ventricular ejection fraction, by estimation, is 65 to 70%. The left ventricle has normal function. The left ventricle has no regional wall motion abnormalities. There is severe concentric left ventricular hypertrophy, best seen in PSAX views. Left ventricular diastolic parameters are consistent with Grade II diastolic dysfunction (pseudonormalization). No evidence of left apical aneurysm. 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  2. Right ventricular systolic function is normal. The right ventricular size is normal. There is normal pulmonary artery systolic pressure.  3. The mitral valve is normal in structure. No evidence of mitral valve regurgitation. No evidence of mitral stenosis.  4. The aortic valve was not well visualized. Aortic valve regurgitation is not visualized. No aortic stenosis is present. Conclusion(s)/Recommendation(s): If negative ischemic work up, cardiac MRI may be considered to assess for etiology of hypertrophy. FINDINGS  Left Ventricle: Left ventricular ejection fraction, by estimation, is 65 to 70%. The left ventricle has normal function. The left ventricle has no regional wall motion abnormalities. The left ventricular internal cavity size was normal in size. There is  severe concentric left ventricular hypertrophy. Left ventricular diastolic parameters are consistent with Grade II diastolic dysfunction (pseudonormalization). Right Ventricle: The right ventricular size is normal. No increase in right ventricular wall thickness. Right ventricular systolic function is normal. There is normal pulmonary artery systolic pressure. The tricuspid regurgitant velocity is 2.48 m/s, and  with an assumed right atrial pressure of 3 mmHg, the estimated right ventricular systolic pressure is 0000000 mmHg. Left Atrium: Left atrial size was normal in size. Right Atrium: Right atrial size was normal in size. Pericardium: There is no evidence of pericardial  effusion. Mitral Valve: The mitral valve is normal in structure. No evidence of mitral valve regurgitation. No evidence of mitral valve stenosis. Tricuspid Valve: The tricuspid valve is normal in structure. Tricuspid valve regurgitation is not demonstrated. No evidence of tricuspid stenosis. Aortic Valve: The aortic valve was not well visualized. Aortic valve regurgitation is not visualized. No aortic stenosis is present. Pulmonic Valve: The pulmonic valve was normal in structure. Pulmonic valve regurgitation is not visualized. No evidence of pulmonic stenosis. Aorta: The aortic root and ascending aorta are structurally normal, with no evidence of dilitation. IAS/Shunts: No atrial level shunt detected by color flow Doppler.  LEFT VENTRICLE PLAX 2D LVOT diam:     1.90 cm   Diastology LV SV:         68        LV e' medial:    6.20 cm/s LV SV Index:   40        LV E/e' medial:  14.1 LVOT Area:     2.84 cm  LV e' lateral:   4.90 cm/s  LV E/e' lateral: 17.8  RIGHT VENTRICLE RV S prime:     12.50 cm/s TAPSE (M-mode): 1.4 cm LEFT ATRIUM             Index        RIGHT ATRIUM           Index LA Vol (A2C):   28.8 ml 16.90 ml/m  RA Area:     11.00 cm LA Vol (A4C):   33.4 ml 19.60 ml/m  RA Volume:   22.10 ml  12.97 ml/m LA Biplane Vol: 33.2 ml 19.48 ml/m  AORTIC VALVE LVOT Vmax:   112.00 cm/s LVOT Vmean:  83.100 cm/s LVOT VTI:    0.241 m  AORTA Ao Asc diam: 2.65 cm MITRAL VALVE               TRICUSPID VALVE MV Area (PHT): 3.93 cm    TR Peak grad:   24.6 mmHg MV Decel Time: 193 msec    TR Vmax:        248.00 cm/s MV E velocity: 87.40 cm/s MV A velocity: 73.70 cm/s  SHUNTS MV E/A ratio:  1.19        Systemic VTI:  0.24 m                            Systemic Diam: 1.90 cm Riley Lam MD Electronically signed by Riley Lam MD Signature Date/Time: 09/16/2021/1:10:54 PM    Final    CT CHEST ABDOMEN PELVIS WO CONTRAST  Result Date: 09/15/2021 CLINICAL DATA:  Sepsis, shortness of  breath, fatigue, blurred vision, aching, history of hypertension, LEFT renal atrophy EXAM: CT CHEST, ABDOMEN AND PELVIS WITHOUT CONTRAST TECHNIQUE: Multidetector CT imaging of the chest, abdomen and pelvis was performed following the standard protocol without IV contrast. RADIATION DOSE REDUCTION: This exam was performed according to the departmental dose-optimization program which includes automated exposure control, adjustment of the mA and/or kV according to patient size and/or use of iterative reconstruction technique. COMPARISON:  CT abdomen and pelvis 05/06/2021 FINDINGS: CT CHEST FINDINGS Cardiovascular: Aorta normal caliber. Heart unremarkable. No pericardial effusion. Mediastinum/Nodes: Esophagus normal appearance. Base of cervical region unremarkable. No thoracic adenopathy. Lungs/Pleura: Mild dependent atelectasis in LEFT lower lobe. Remaining lungs clear. No infiltrate, pleural effusion, pneumothorax, or mass. Musculoskeletal: Unremarkable CT ABDOMEN PELVIS FINDINGS Hepatobiliary: Gallbladder and liver normal appearance Pancreas: Normal appearance Spleen: Normal appearance Adrenals/Urinary Tract: Adrenal glands normal appearance. Atrophic LEFT kidney with chronic mild hydronephrosis. Tiny calculus inferior pole LEFT kidney. RIGHT hydronephrosis again identified though no definite ureteral calcification or dilatation is seen. Mild diffuse bladder wall thickening. No renal masses. Stomach/Bowel: Appendix surgically absent by history. Stomach and bowel loops normal appearance. Vascular/Lymphatic: Aorta normal caliber.  No adenopathy. Reproductive: Uterus and ovaries normal appearance. Tampon in vagina. Other: No free air or free fluid. No hernia or inflammatory process. Musculoskeletal: Unremarkable IMPRESSION: Mild dependent atelectasis in LEFT lower lobe. Atrophic LEFT kidney with chronic mild hydronephrosis. RIGHT hydronephrosis again identified though no definite ureteral calcification or dilatation is  seen. Tiny nonobstructing inferior pole LEFT renal calculus. Mild chronic bladder wall thickening question cystitis, recommend correlation with urinalysis. No acute intrathoracic, intra-abdominal, or intrapelvic abnormalities. Electronically Signed   By: Ulyses Southward M.D.   On: 09/15/2021 18:09   DG Chest Port 1 View  Result Date: 09/15/2021 CLINICAL DATA:  Shortness of breath since last Thursday EXAM: PORTABLE CHEST 1 VIEW COMPARISON:  Chest radiograph 07/22/2019 FINDINGS: Cardiomediastinal silhouette is  normal. There is no focal consolidation or pulmonary edema. There is no pleural effusion or pneumothorax. A probable calcified granuloma in the right upper lobe is unchanged. There is no acute osseous abnormality. IMPRESSION: No radiographic evidence of acute cardiopulmonary process. Electronically Signed   By: Lesia Hausen M.D.   On: 09/15/2021 14:55     LOS: 2 days   Jeoffrey Massed, MD  [facility]    To contact the attending provider between 7A-7P or the covering provider during after hours 7P-7A, please log into the web site [email or web address] and access using universal [place] password for that web site. If you do not have the password, please call the hospital operator.  09/17/2021, 2:02 PM

## 2021-09-17 NOTE — Progress Notes (Signed)
Progress Note  Patient Name: [name] Date of Encounter: 09/17/2021  [facility] Cardiologist: Angelena Form  Subjective   No acute events overnight.  Denies chest pain or dyspnea.  Inpatient Medications    Scheduled Meds:  aspirin EC  81 mg Oral Daily   atorvastatin  80 mg Oral Daily   carvedilol  3.125 mg Oral BID WC   [START ON 09/18/2021] cosyntropin  0.25 mg Intravenous Once   sodium chloride flush  3 mL Intravenous Q12H   Continuous Infusions:  sodium chloride     sodium chloride 1 mL/kg/hr (09/17/21 0505)   cefTRIAXone (ROCEPHIN)  IV 1 g (09/16/21 2032)   heparin 1,250 Units/hr (09/17/21 0434)   PRN Meds: sodium chloride, acetaminophen, nitroGLYCERIN, ondansetron (ZOFRAN) IV, sodium chloride flush   Vital Signs    Vitals:   09/16/21 2200 09/16/21 2300 09/17/21 0100 09/17/21 0419  BP:  (!) 155/83  (!) 155/80  Pulse:  82    Resp: 20  16 20   Temp:    98.5 F (36.9 C)  TempSrc:    Oral  SpO2:  100%    Weight:    69.7 kg  Height:        Intake/Output Summary (Last 24 hours) at 09/17/2021 0849 Last data filed at 09/17/2021 0420 Gross per 24 hour  Intake 720 ml  Output 300 ml  Net 420 ml      09/17/2021    4:19 AM 09/16/2021    2:56 AM 09/15/2021    9:25 PM  Last 3 Weights  Weight (lbs) 153 lb 11.2 oz 148 lb 9.6 oz 148 lb 14.4 oz  Weight (kg) 69.718 kg 67.405 kg 67.541 kg      Telemetry    SR- Personally Reviewed  ECG    SR with LVH- Personally Reviewed  Physical Exam   GEN: No acute distress.   Neck: No JVD Cardiac: RRR, no murmurs, rubs, or gallops.  Respiratory: Clear to auscultation bilaterally. GI: Soft, nontender, non-distended  MS: No edema; No deformity. Neuro:  Nonfocal  Psych: Normal affect   Labs    High Sensitivity Troponin:   Recent Labs  Lab 09/15/21 1429 09/15/21 1613 09/15/21 2226 09/16/21 0326  TROPONINIHS 253* 419* 1,197* 1,049*     Chemistry Recent Labs  Lab 09/15/21 1429 09/15/21 1514 09/16/21 0326  09/16/21 0331 09/17/21 0249  NA 137   < > 142 141 141  K 2.9*   < > 3.4* 3.7 3.6  CL 107   < > 116* 115* 115*  CO2 21*  --  23 21* 22  GLUCOSE 128*   < > 101* 101* 101*  BUN 40*   < > 30* 32* 21*  CREATININE 2.29*   < > 1.55* 1.56* 1.24*  CALCIUM 9.3  --  8.1* 8.2* 8.2*  MG 2.4  --   --  2.0  --   PROT 8.2*  --  5.7*  --  5.5*  ALBUMIN 3.8  --  2.4*  --  2.4*  AST 41  --  43*  --  37  ALT 34  --  27  --  31  ALKPHOS 88  --  66  --  61  BILITOT 0.4  --  0.3  --  0.3  GFRNONAA 27*  --  43* 43* 56*  ANIONGAP 9  --  3* 5 4*   < > = values in this interval not displayed.    Lipids  Recent Labs  Lab  09/16/21 0326  CHOL 154  TRIG 181*  HDL 22*  LDLCALC 96  CHOLHDL 7.0    Hematology Recent Labs  Lab 09/15/21 1429 09/15/21 1514 09/16/21 0331 09/17/21 0249  WBC 17.1*  --  12.5* 10.6*  RBC 5.20*  --  4.10 3.96  HGB 14.3 15.6* 11.4* 11.1*  HCT 43.4 46.0 34.8* 34.4*  MCV 83.5  --  84.9 86.9  MCH 27.5  --  27.8 28.0  MCHC 32.9  --  32.8 32.3  RDW 13.7  --  13.9 14.0  PLT 392  --  326 271   Thyroid  Recent Labs  Lab 09/16/21 0326  TSH 2.063    BNPNo results for input(s): "BNP", "PROBNP" in the last 168 hours.  DDimer  Recent Labs  Lab 09/15/21 1429  DDIMER 0.49     Radiology    ECHOCARDIOGRAM COMPLETE  Result Date: 09/16/2021    ECHOCARDIOGRAM REPORT   Patient Name:   [name] Date of Exam: 09/16/2021 Medical Rec #:  [id number]        Height:       63.0 in Accession #:    [id number]       Weight:       148.6 lb Date of Birth:  Jul 22, 1980        BSA:          1.704 m Patient Age:    41 years         BP:           132/80 mmHg Patient Gender: F                HR:           65 bpm. Exam Location:  Inpatient Procedure: Cardiac Doppler, Color Doppler and Intracardiac Opacification Agent Indications:    NSTEMI  History:        Patient has no prior history of Echocardiogram examinations.                 Angina; Signs/Symptoms:Dyspnea. NSTEMI.  Sonographer:    Melissa  Morford RDCS (AE, PE) Referring Phys: [id number] JAMES KIM IMPRESSIONS  1. Left ventricular ejection fraction, by estimation, is 65 to 70%. The left ventricle has normal function. The left ventricle has no regional wall motion abnormalities. There is severe concentric left ventricular hypertrophy, best seen in PSAX views. Left ventricular diastolic parameters are consistent with Grade II diastolic dysfunction (pseudonormalization). No evidence of left apical aneurysm. 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  2. Right ventricular systolic function is normal. The right ventricular size is normal. There is normal pulmonary artery systolic pressure.  3. The mitral valve is normal in structure. No evidence of mitral valve regurgitation. No evidence of mitral stenosis.  4. The aortic valve was not well visualized. Aortic valve regurgitation is not visualized. No aortic stenosis is present. Conclusion(s)/Recommendation(s): If negative ischemic work up, cardiac MRI may be considered to assess for etiology of hypertrophy. FINDINGS  Left Ventricle: Left ventricular ejection fraction, by estimation, is 65 to 70%. The left ventricle has normal function. The left ventricle has no regional wall motion abnormalities. The left ventricular internal cavity size was normal in size. There is  severe concentric left ventricular hypertrophy. Left ventricular diastolic parameters are consistent with Grade II diastolic dysfunction (pseudonormalization). Right Ventricle: The right ventricular size is normal. No increase in right ventricular wall thickness. Right ventricular systolic function is normal. There is normal pulmonary artery systolic pressure. The tricuspid regurgitant  velocity is 2.48 m/s, and  with an assumed right atrial pressure of 3 mmHg, the estimated right ventricular systolic pressure is 27.6 mmHg. Left Atrium: Left atrial size was normal in size. Right Atrium: Right atrial size was normal in size.  Pericardium: There is no evidence of pericardial effusion. Mitral Valve: The mitral valve is normal in structure. No evidence of mitral valve regurgitation. No evidence of mitral valve stenosis. Tricuspid Valve: The tricuspid valve is normal in structure. Tricuspid valve regurgitation is not demonstrated. No evidence of tricuspid stenosis. Aortic Valve: The aortic valve was not well visualized. Aortic valve regurgitation is not visualized. No aortic stenosis is present. Pulmonic Valve: The pulmonic valve was normal in structure. Pulmonic valve regurgitation is not visualized. No evidence of pulmonic stenosis. Aorta: The aortic root and ascending aorta are structurally normal, with no evidence of dilitation. IAS/Shunts: No atrial level shunt detected by color flow Doppler.  LEFT VENTRICLE PLAX 2D LVOT diam:     1.90 cm   Diastology LV SV:         68        LV e' medial:    6.20 cm/s LV SV Index:   40        LV E/e' medial:  14.1 LVOT Area:     2.84 cm  LV e' lateral:   4.90 cm/s                          LV E/e' lateral: 17.8  RIGHT VENTRICLE RV S prime:     12.50 cm/s TAPSE (M-mode): 1.4 cm LEFT ATRIUM             Index        RIGHT ATRIUM           Index LA Vol (A2C):   28.8 ml 16.90 ml/m  RA Area:     11.00 cm LA Vol (A4C):   33.4 ml 19.60 ml/m  RA Volume:   22.10 ml  12.97 ml/m LA Biplane Vol: 33.2 ml 19.48 ml/m  AORTIC VALVE LVOT Vmax:   112.00 cm/s LVOT Vmean:  83.100 cm/s LVOT VTI:    0.241 m  AORTA Ao Asc diam: 2.65 cm MITRAL VALVE               TRICUSPID VALVE MV Area (PHT): 3.93 cm    TR Peak grad:   24.6 mmHg MV Decel Time: 193 msec    TR Vmax:        248.00 cm/s MV E velocity: 87.40 cm/s MV A velocity: 73.70 cm/s  SHUNTS MV E/A ratio:  1.19        Systemic VTI:  0.24 m                            Systemic Diam: 1.90 cm Riley Lam MD Electronically signed by Riley Lam MD Signature Date/Time: 09/16/2021/1:10:54 PM    Final    CT CHEST ABDOMEN PELVIS WO CONTRAST  Result Date:  09/15/2021 CLINICAL DATA:  Sepsis, shortness of breath, fatigue, blurred vision, aching, history of hypertension, LEFT renal atrophy EXAM: CT CHEST, ABDOMEN AND PELVIS WITHOUT CONTRAST TECHNIQUE: Multidetector CT imaging of the chest, abdomen and pelvis was performed following the standard protocol without IV contrast. RADIATION DOSE REDUCTION: This exam was performed according to the departmental dose-optimization program which includes automated exposure control, adjustment of the mA and/or kV according to patient size  and/or use of iterative reconstruction technique. COMPARISON:  CT abdomen and pelvis 05/06/2021 FINDINGS: CT CHEST FINDINGS Cardiovascular: Aorta normal caliber. Heart unremarkable. No pericardial effusion. Mediastinum/Nodes: Esophagus normal appearance. Base of cervical region unremarkable. No thoracic adenopathy. Lungs/Pleura: Mild dependent atelectasis in LEFT lower lobe. Remaining lungs clear. No infiltrate, pleural effusion, pneumothorax, or mass. Musculoskeletal: Unremarkable CT ABDOMEN PELVIS FINDINGS Hepatobiliary: Gallbladder and liver normal appearance Pancreas: Normal appearance Spleen: Normal appearance Adrenals/Urinary Tract: Adrenal glands normal appearance. Atrophic LEFT kidney with chronic mild hydronephrosis. Tiny calculus inferior pole LEFT kidney. RIGHT hydronephrosis again identified though no definite ureteral calcification or dilatation is seen. Mild diffuse bladder wall thickening. No renal masses. Stomach/Bowel: Appendix surgically absent by history. Stomach and bowel loops normal appearance. Vascular/Lymphatic: Aorta normal caliber.  No adenopathy. Reproductive: Uterus and ovaries normal appearance. Tampon in vagina. Other: No free air or free fluid. No hernia or inflammatory process. Musculoskeletal: Unremarkable IMPRESSION: Mild dependent atelectasis in LEFT lower lobe. Atrophic LEFT kidney with chronic mild hydronephrosis. RIGHT hydronephrosis again identified though no  definite ureteral calcification or dilatation is seen. Tiny nonobstructing inferior pole LEFT renal calculus. Mild chronic bladder wall thickening question cystitis, recommend correlation with urinalysis. No acute intrathoracic, intra-abdominal, or intrapelvic abnormalities. Electronically Signed   By: Ulyses Southward M.D.   On: 09/15/2021 18:09   DG Chest Port 1 View  Result Date: 09/15/2021 CLINICAL DATA:  Shortness of breath since last Thursday EXAM: PORTABLE CHEST 1 VIEW COMPARISON:  Chest radiograph 07/22/2019 FINDINGS: Cardiomediastinal silhouette is normal. There is no focal consolidation or pulmonary edema. There is no pleural effusion or pneumothorax. A probable calcified granuloma in the right upper lobe is unchanged. There is no acute osseous abnormality. IMPRESSION: No radiographic evidence of acute cardiopulmonary process. Electronically Signed   By: Lesia Hausen M.D.   On: 09/15/2021 14:55    Cardiac Studies   TTE 8/1: 1. Left ventricular ejection fraction, by estimation, is 65 to 70%. The  left ventricle has normal function. The left ventricle has no regional  wall motion abnormalities. There is severe concentric left ventricular  hypertrophy, best seen in PSAX views.  Left ventricular diastolic parameters are consistent with Grade II  diastolic dysfunction (pseudonormalization). No evidence of left apical  aneurysm. 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   2. Right ventricular systolic function is normal. The right ventricular  size is normal. There is normal pulmonary artery systolic pressure.   3. The mitral valve is normal in structure. No evidence of mitral valve  regurgitation. No evidence of mitral stenosis.   4. The aortic valve was not well visualized. Aortic valve regurgitation  is not visualized. No aortic stenosis is present.   Conclusion(s)/Recommendation(s): If negative ischemic work up, cardiac MRI  may be considered to assess for  etiology of hypertrophy.   Patient Profile     41 y.o. female  with a hx of hypertension, anxiety/depression, kidney stones, pyelonephritis, nephrolithiasis who is being seen 09/16/2021 for the evaluation of NSTEMI at the request of Dr. Jerral Ralph.  Assessment & Plan    ACS:  Coronary angiography today and if negative, dyspnea and trop elevation likely due to diastolic dysfunction/LVH.  Consider outpatient CMRI to evaluate if this is the case.  HTN:  Given LVH, patient would benefit from losartan however has CKD.  Consider low dose as outpatient depending on cr.  CKD:  Cr down to 1.2, though fluctuating quite a bit over the last few months.  LVH:  Will start Jardiance as GFR  is ok.     For questions or updates, please contact [facility] Please consult [email or web address] for contact info under        Signed, Orbie Pyo, MD  09/17/2021, 8:49 AM

## 2021-09-17 NOTE — Progress Notes (Signed)
ANTICOAGULATION CONSULT NOTE   Pharmacy Consult for heparin  Indication: chest pain/ACS  Allergies  Allergen Reactions   Adhesive [Tape] Rash    Patient Measurements: Height: 5\' 3"  (160 cm) Weight: 67.4 kg (148 lb 9.6 oz) IBW/kg (Calculated) : 52.4 Heparin Dosing Weight: 64.9 kg  Vital Signs: Temp: 98.4 F (36.9 C) (08/01 1927) Temp Source: Oral (08/01 1927) BP: 155/83 (08/01 2300) Pulse Rate: 82 (08/01 2300)  Labs: Recent Labs    09/15/21 1429 09/15/21 1514 09/15/21 1515 09/15/21 1613 09/15/21 2226 09/16/21 0326 09/16/21 0331 09/16/21 1224 09/17/21 0249  HGB 14.3 15.6*  --   --   --   --  11.4*  --  11.1*  HCT 43.4 46.0  --   --   --   --  34.8*  --  34.4*  PLT 392  --   --   --   --   --  326  --  271  LABPROT 13.0  --   --   --   --   --   --   --   --   INR 1.0  --   --   --   --   --   --   --   --   HEPARINUNFRC  --   --   --   --   --  0.16*  --  0.28* 0.27*  CREATININE 2.29* 2.50*   < >  --   --  1.55* 1.56*  --  1.24*  CKTOTAL  --   --   --   --  513*  --   --   --  211  CKMB  --   --   --   --  43.4*  --   --   --   --   TROPONINIHS 253*  --   --  419* 1,197* 1,049*  --   --   --    < > = values in this interval not displayed.     Estimated Creatinine Clearance: 55 mL/min (A) (by C-G formula based on SCr of 1.24 mg/dL (H)).   Medical History: Past Medical History:  Diagnosis Date   Anxiety    Atrophy of left kidney    Depression    History of kidney stones    Hydrosalpinx    Hypertension    followed by pcp   Nephrolithiasis    per CT 05-06-2021  right renal stone nonostructive   Pain, female pelvic    Urgency of urination     Assessment: Pt is a 41 y.o. female with medical history significant of hypertension, anxiety/depression, apparently has had dyspnea over the past 4 weeks c/o feeling weak, and dyspneic while at work today and is now ordered heparin for ACS (no STEMI).  Heparin level of 0.27 is subtherapeutic on heparin 1150  units/hr. Per RN previous IV infiltrated earlier last night but current IV with heparin running without issues for at least 6 hrs. No signs of bleeding noted.   Goal of Therapy:  Heparin level goal = 0-3.-0.7 Monitor platelets by anticoagulation protocol: Yes   Plan:  Increase heparin to 1250 units/hr No recheck heparin level with plan for cath today - f/u after cath   46, PharmD, BCPS Clinical Pharmacist 09/17/2021 4:08 AM

## 2021-09-18 ENCOUNTER — Other Ambulatory Visit (HOSPITAL_COMMUNITY): Payer: Self-pay

## 2021-09-18 ENCOUNTER — Encounter (HOSPITAL_COMMUNITY): Payer: Self-pay | Admitting: Interventional Cardiology

## 2021-09-18 ENCOUNTER — Telehealth (HOSPITAL_COMMUNITY): Payer: Self-pay | Admitting: Pharmacy Technician

## 2021-09-18 DIAGNOSIS — R778 Other specified abnormalities of plasma proteins: Secondary | ICD-10-CM | POA: Diagnosis not present

## 2021-09-18 DIAGNOSIS — E876 Hypokalemia: Secondary | ICD-10-CM | POA: Diagnosis not present

## 2021-09-18 DIAGNOSIS — N179 Acute kidney failure, unspecified: Secondary | ICD-10-CM | POA: Diagnosis not present

## 2021-09-18 DIAGNOSIS — I214 Non-ST elevation (NSTEMI) myocardial infarction: Secondary | ICD-10-CM | POA: Diagnosis not present

## 2021-09-18 LAB — BASIC METABOLIC PANEL
Anion gap: 6 (ref 5–15)
BUN: 21 mg/dL — ABNORMAL HIGH (ref 6–20)
CO2: 20 mmol/L — ABNORMAL LOW (ref 22–32)
Calcium: 8.3 mg/dL — ABNORMAL LOW (ref 8.9–10.3)
Chloride: 113 mmol/L — ABNORMAL HIGH (ref 98–111)
Creatinine, Ser: 1.44 mg/dL — ABNORMAL HIGH (ref 0.44–1.00)
GFR, Estimated: 47 mL/min — ABNORMAL LOW (ref >60)
Glucose, Bld: 157 mg/dL — ABNORMAL HIGH (ref 70–99)
Potassium: 3.2 mmol/L — ABNORMAL LOW (ref 3.5–5.1)
Sodium: 139 mmol/L (ref 135–145)

## 2021-09-18 LAB — ACTH STIMULATION, 3 TIME POINTS
Cortisol, 30 Min: 22.6 ug/dL
Cortisol, 60 Min: 23.3 ug/dL
Cortisol, Base: 7.6 ug/dL

## 2021-09-18 MED ORDER — ATORVASTATIN CALCIUM 80 MG PO TABS
80.0000 mg | ORAL_TABLET | Freq: Every day | ORAL | 3 refills | Status: DC
  Filled 2021-09-18: qty 30, 30d supply, fill #0

## 2021-09-18 MED ORDER — CARVEDILOL 6.25 MG PO TABS
6.2500 mg | ORAL_TABLET | Freq: Two times a day (BID) | ORAL | 3 refills | Status: DC
  Filled 2021-09-18: qty 60, 30d supply, fill #0

## 2021-09-18 MED ORDER — POLYETHYLENE GLYCOL 3350 17 G PO PACK
17.0000 g | PACK | Freq: Every day | ORAL | Status: DC
  Administered 2021-09-18: 17 g via ORAL
  Filled 2021-09-18: qty 1

## 2021-09-18 MED ORDER — ASPIRIN 81 MG PO TBEC
81.0000 mg | DELAYED_RELEASE_TABLET | Freq: Every day | ORAL | 12 refills | Status: DC
  Filled 2021-09-18: qty 30, 30d supply, fill #0

## 2021-09-18 MED ORDER — POTASSIUM CHLORIDE CRYS ER 20 MEQ PO TBCR
40.0000 meq | EXTENDED_RELEASE_TABLET | Freq: Once | ORAL | Status: AC
  Administered 2021-09-18: 40 meq via ORAL
  Filled 2021-09-18: qty 2

## 2021-09-18 MED ORDER — LOSARTAN POTASSIUM 25 MG PO TABS
25.0000 mg | ORAL_TABLET | Freq: Every day | ORAL | 3 refills | Status: DC
  Filled 2021-09-18: qty 30, 30d supply, fill #0

## 2021-09-18 MED ORDER — EMPAGLIFLOZIN 10 MG PO TABS
10.0000 mg | ORAL_TABLET | Freq: Every day | ORAL | 3 refills | Status: DC
  Filled 2021-09-18: qty 30, 30d supply, fill #0

## 2021-09-18 MED ORDER — LOSARTAN POTASSIUM 25 MG PO TABS
25.0000 mg | ORAL_TABLET | Freq: Every day | ORAL | Status: DC
Start: 2021-09-18 — End: 2021-09-18

## 2021-09-18 NOTE — Progress Notes (Signed)
Progress Note  Patient Name: SKYLAH MARSTON Date of Encounter: 09/18/2021  [facility] Cardiologist: Angelena Form  Subjective   Cath yesterday with mild disease, LVEDP 24.  Patient doing well today and is without complaints.  Inpatient Medications    Scheduled Meds:  aspirin EC  81 mg Oral Daily   atorvastatin  80 mg Oral Daily   carvedilol  6.25 mg Oral BID WC   empagliflozin  10 mg Oral Daily   losartan  25 mg Oral QHS   sodium chloride flush  3 mL Intravenous Q12H   sodium chloride flush  3 mL Intravenous Q12H   Continuous Infusions:  sodium chloride     PRN Meds: sodium chloride, acetaminophen, hydrALAZINE, nitroGLYCERIN, ondansetron (ZOFRAN) IV, sodium chloride flush   Vital Signs    Vitals:   09/18/21 0200 09/18/21 0410 09/18/21 0543 09/18/21 0815  BP:  (!) 160/88 (!) 147/78 (!) 155/86  Pulse:  76 85 87  Resp: (!) 21 19 (!) 22 17  Temp:  98.7 F (37.1 C)  97.8 F (36.6 C)  TempSrc:  Oral  Oral  SpO2:  99%    Weight:  70.1 kg    Height:        Intake/Output Summary (Last 24 hours) at 09/18/2021 0855 Last data filed at 09/18/2021 0413 Gross per 24 hour  Intake --  Output 350 ml  Net -350 ml      09/18/2021    4:10 AM 09/17/2021    4:19 AM 09/16/2021    2:56 AM  Last 3 Weights  Weight (lbs) 154 lb 8 oz 153 lb 11.2 oz 148 lb 9.6 oz  Weight (kg) 70.081 kg 69.718 kg 67.405 kg      Telemetry    SR- Personally Reviewed  ECG    SR with LVH- Personally Reviewed  Physical Exam   GEN: No acute distress.   Neck: No JVD Cardiac: RRR, no murmurs, rubs, or gallops.  Respiratory: Clear to auscultation bilaterally. GI: Soft, nontender, non-distended  MS: No edema; No deformity. Neuro:  Nonfocal  Psych: Normal affect   Labs    High Sensitivity Troponin:   Recent Labs  Lab 09/15/21 1429 09/15/21 1613 09/15/21 2226 09/16/21 0326  TROPONINIHS 253* 419* 1,197* 1,049*     Chemistry Recent Labs  Lab 09/15/21 1429 09/15/21 1514 09/16/21 0326  09/16/21 0331 09/17/21 0249 09/18/21 0535  NA 137   < > 142 141 141 139  K 2.9*   < > 3.4* 3.7 3.6 3.2*  CL 107   < > 116* 115* 115* 113*  CO2 21*  --  23 21* 22 20*  GLUCOSE 128*   < > 101* 101* 101* 157*  BUN 40*   < > 30* 32* 21* 21*  CREATININE 2.29*   < > 1.55* 1.56* 1.24* 1.44*  CALCIUM 9.3  --  8.1* 8.2* 8.2* 8.3*  MG 2.4  --   --  2.0  --   --   PROT 8.2*  --  5.7*  --  5.5*  --   ALBUMIN 3.8  --  2.4*  --  2.4*  --   AST 41  --  43*  --  37  --   ALT 34  --  27  --  31  --   ALKPHOS 88  --  66  --  61  --   BILITOT 0.4  --  0.3  --  0.3  --   GFRNONAA 27*  --  43* 43* 56* 47*  ANIONGAP 9  --  3* 5 4* 6   < > = values in this interval not displayed.    Lipids  Recent Labs  Lab 09/16/21 0326  CHOL 154  TRIG 181*  HDL 22*  LDLCALC 96  CHOLHDL 7.0    Hematology Recent Labs  Lab 09/15/21 1429 09/15/21 1514 09/16/21 0331 09/17/21 0249  WBC 17.1*  --  12.5* 10.6*  RBC 5.20*  --  4.10 3.96  HGB 14.3 15.6* 11.4* 11.1*  HCT 43.4 46.0 34.8* 34.4*  MCV 83.5  --  84.9 86.9  MCH 27.5  --  27.8 28.0  MCHC 32.9  --  32.8 32.3  RDW 13.7  --  13.9 14.0  PLT 392  --  326 271   Thyroid  Recent Labs  Lab 09/16/21 0326  TSH 2.063    BNPNo results for input(s): "BNP", "PROBNP" in the last 168 hours.  DDimer  Recent Labs  Lab 09/15/21 1429  DDIMER 0.49     Radiology    CARDIAC CATHETERIZATION  Result Date: 09/17/2021   Mid LAD lesion is 20% stenosed.   The left ventricular systolic function is normal.   LV end diastolic pressure is moderately elevated.   The left ventricular ejection fraction is greater than 65% by visual estimate.   There is no aortic valve stenosis. No significant CAD.  No evidence of SCAD.  Hyperdynamic LV function.  Short ascending aorta.  JL3 required to engage the left main.  Mild right radial vasospasm noted.  Extra verapamil given during the end of the case.  Continue BP and diastolic heart failure management.   ECHOCARDIOGRAM  COMPLETE  Result Date: 09/16/2021    ECHOCARDIOGRAM REPORT   Patient Name:   SHARICA NADELL Date of Exam: 09/16/2021 Medical Rec #:  [id number]        Height:       63.0 in Accession #:    [id number]       Weight:       148.6 lb Date of Birth:  07/10/1980        BSA:          1.704 m Patient Age:    41 years         BP:           132/80 mmHg Patient Gender: F                HR:           65 bpm. Exam Location:  Inpatient Procedure: Cardiac Doppler, Color Doppler and Intracardiac Opacification Agent Indications:    NSTEMI  History:        Patient has no prior history of Echocardiogram examinations.                 Angina; Signs/Symptoms:Dyspnea. NSTEMI.  Sonographer:    Melissa Morford RDCS (AE, PE) Referring Phys: [place]  1. Left ventricular ejection fraction, by estimation, is 65 to 70%. The left ventricle has normal function. The left ventricle has no regional wall motion abnormalities. There is severe concentric left ventricular hypertrophy, best seen in PSAX views. Left ventricular diastolic parameters are consistent with Grade II diastolic dysfunction (pseudonormalization). No evidence of left apical aneurysm. 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  2. Right ventricular systolic function is normal. The right ventricular size is normal. There is normal pulmonary artery systolic pressure.  3. The mitral valve is normal in structure.  No evidence of mitral valve regurgitation. No evidence of mitral stenosis.  4. The aortic valve was not well visualized. Aortic valve regurgitation is not visualized. No aortic stenosis is present. Conclusion(s)/Recommendation(s): If negative ischemic work up, cardiac MRI may be considered to assess for etiology of hypertrophy. FINDINGS  Left Ventricle: Left ventricular ejection fraction, by estimation, is 65 to 70%. The left ventricle has normal function. The left ventricle has no regional wall motion abnormalities. The left  ventricular internal cavity size was normal in size. There is  severe concentric left ventricular hypertrophy. Left ventricular diastolic parameters are consistent with Grade II diastolic dysfunction (pseudonormalization). Right Ventricle: The right ventricular size is normal. No increase in right ventricular wall thickness. Right ventricular systolic function is normal. There is normal pulmonary artery systolic pressure. The tricuspid regurgitant velocity is 2.48 m/s, and  with an assumed right atrial pressure of 3 mmHg, the estimated right ventricular systolic pressure is 27.6 mmHg. Left Atrium: Left atrial size was normal in size. Right Atrium: Right atrial size was normal in size. Pericardium: There is no evidence of pericardial effusion. Mitral Valve: The mitral valve is normal in structure. No evidence of mitral valve regurgitation. No evidence of mitral valve stenosis. Tricuspid Valve: The tricuspid valve is normal in structure. Tricuspid valve regurgitation is not demonstrated. No evidence of tricuspid stenosis. Aortic Valve: The aortic valve was not well visualized. Aortic valve regurgitation is not visualized. No aortic stenosis is present. Pulmonic Valve: The pulmonic valve was normal in structure. Pulmonic valve regurgitation is not visualized. No evidence of pulmonic stenosis. Aorta: The aortic root and ascending aorta are structurally normal, with no evidence of dilitation. IAS/Shunts: No atrial level shunt detected by color flow Doppler.  LEFT VENTRICLE PLAX 2D LVOT diam:     1.90 cm   Diastology LV SV:         68        LV e' medial:    6.20 cm/s LV SV Index:   40        LV E/e' medial:  14.1 LVOT Area:     2.84 cm  LV e' lateral:   4.90 cm/s                          LV E/e' lateral: 17.8  RIGHT VENTRICLE RV S prime:     12.50 cm/s TAPSE (M-mode): 1.4 cm LEFT ATRIUM             Index        RIGHT ATRIUM           Index LA Vol (A2C):   28.8 ml 16.90 ml/m  RA Area:     11.00 cm LA Vol (A4C):   33.4  ml 19.60 ml/m  RA Volume:   22.10 ml  12.97 ml/m LA Biplane Vol: 33.2 ml 19.48 ml/m  AORTIC VALVE LVOT Vmax:   112.00 cm/s LVOT Vmean:  83.100 cm/s LVOT VTI:    0.241 m  AORTA Ao Asc diam: 2.65 cm MITRAL VALVE               TRICUSPID VALVE MV Area (PHT): 3.93 cm    TR Peak grad:   24.6 mmHg MV Decel Time: 193 msec    TR Vmax:        248.00 cm/s MV E velocity: 87.40 cm/s MV A velocity: 73.70 cm/s  SHUNTS MV E/A ratio:  1.19  Systemic VTI:  0.24 m                            Systemic Diam: 1.90 cm Riley Lam MD Electronically signed by Riley Lam MD Signature Date/Time: 09/16/2021/1:10:54 PM    Final     Cardiac Studies   Cath 8/2: Mid LAD lesion is 20% stenosed.   The left ventricular systolic function is normal.   LV end diastolic pressure is moderately elevated.   The left ventricular ejection fraction is greater than 65% by visual estimate.   There is no aortic valve stenosis.  TTE 8/1: 1. Left ventricular ejection fraction, by estimation, is 65 to 70%. The  left ventricle has normal function. The left ventricle has no regional  wall motion abnormalities. There is severe concentric left ventricular  hypertrophy, best seen in PSAX views.  Left ventricular diastolic parameters are consistent with Grade II  diastolic dysfunction (pseudonormalization). No evidence of left apical  aneurysm. 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   2. Right ventricular systolic function is normal. The right ventricular  size is normal. There is normal pulmonary artery systolic pressure.   3. The mitral valve is normal in structure. No evidence of mitral valve  regurgitation. No evidence of mitral stenosis.   4. The aortic valve was not well visualized. Aortic valve regurgitation  is not visualized. No aortic stenosis is present.   Conclusion(s)/Recommendation(s): If negative ischemic work up, cardiac MRI  may be considered to assess for etiology of  hypertrophy.   Patient Profile     41 y.o. female  with a hx of hypertension, anxiety/depression, kidney stones, pyelonephritis, nephrolithiasis who is being seen 09/16/2021 for the evaluation of NSTEMI at the request of Dr. Jerral Ralph.  Assessment & Plan    ACS:  Reassuring cath with only mild disease.  ACS likely due to LVH/HTN.  Cont ASA, atorvastatin.  HTN:  Increase losartan to 25mg .  Check Cr as outpatient.  CKD:  Cr 1.44 which is around baseline, monitor for now  LVH/diastolic dysfunction:  High LVEDP so diastolic dysfunction likely cause of symptoms and ACS.  Cont jardiance and losartan. Consider CMRI as outpatient to evaluate further.  OK for discharge today, will arrange cardiology follow-up.     For questions or updates, please contact [facility] Please consult [email or web address] for contact info under        Signed, , MD  09/18/2021, 8:55 AM

## 2021-09-18 NOTE — TOC Progression Note (Signed)
Transition of Care [facility]) - Progression Note    Patient Details  Name: [name] MRN: [id number] Date of Birth: September 03, 1980  [facility]) CM/SW Contact  Leone Haven, RN Phone Number: 09/18/2021, 9:12 AM  Clinical Narrative:    From home, chest pain, NSTEMI, s/p cath, hep drip. Indep. TOC following.         Expected Discharge Plan and Services                                                 Social Determinants of Health (SDOH) Interventions    Readmission Risk Interventions     No data to display

## 2021-09-18 NOTE — TOC Transition Note (Addendum)
Transition of Care [facility]) - CM/SW Discharge Note   Patient Details  Name: [name] MRN: [id number] Date of Birth: [birth year]  Transition [facility] Care [facility]) CM/SW Contact:  Leone Haven, RN Phone Number: 09/18/2021, 10:24 AM   Clinical Narrative:    For dc home has no needs,  TOC to fill meds.  [facility] will transport her home.         Patient Goals and CMS Choice        Discharge Placement                       Discharge Plan and Services                                     Social Determinants of Health (SDOH) Interventions     Readmission Risk Interventions     No data to display

## 2021-09-18 NOTE — Telephone Encounter (Signed)
Pharmacy Patient Advocate Encounter  Insurance verification completed.    The patient is insured through [employer]   The patient is currently admitted and ran test claims for the following: Farxiga, Jardiance.  Copays and coinsurance results were relayed to Inpatient clinical team.      

## 2021-09-18 NOTE — Progress Notes (Signed)
Pt A/O x 4 and independent w/ steady gait. Admitted for NSTEMI. Cath performed yesterday. See note. R wrist is clean, dry, intact and soft. No active bleeding or s/s of infection. Denies pain and SOB. Education provided on meds and follow up care. Pt adequate for discharge.

## 2021-09-18 NOTE — Discharge Summary (Signed)
PATIENT DETAILS Name: SHARIYAH ELAND Age: 41 y.o. Sex: female Date of Birth: [birth year] MRN: [id number]. Admitting Physician: Pearson Grippe, MD [id number], [facility]  Admit Date: 09/15/2021 Discharge date: 09/18/2021  Recommendations for Outpatient Follow-up:  Follow up with PCP in 1-2 weeks Please obtain CMP/CBC in one week  Admitted From:  Home  Disposition: Home   Discharge Condition: good  CODE STATUS:   Code Status: Full Code   Diet recommendation:  Diet Order             Diet - low sodium heart healthy           Diet Heart Room service appropriate? Yes; Fluid consistency: Thin  Diet effective now                    Brief Summary: Patient is a 41 y.o.  female with history of HTN, tobacco use-who presented with several weeks history of fatigue-but for the past 1 week or so-has been getting exertional dyspnea as well.  She presented to the [facility] ED where she was found to have non-STEMI and transferred to the hospitalist service at [facility] for further evaluation.    Significant events: 7/31>> admit-fatigue times several weeks-exertional dyspnea x1 week-elevated cardiac enzymes.   Significant studies: 7/31>> CT chest/abdomen/pelvis: No acute intrathoracic/intra-abdominal/intrapelvic abnormalities. 8/01>> Echo: EF 65-70%, grade 2 diastolic dysfunction.   Significant microbiology data: 7/31>> blood culture: No growth 7/31>> urine culture: Multiple organisms.   Procedures: 8/2>> LHC: Mid LAD lesion is 20% stenosed.   The left ventricular systolic function is normal.   LV end diastolic pressure is moderately elevated.   The left ventricular ejection fraction is greater than 65% by visual estimate.   There is no aortic valve stenosis.   Consults: Cardiology  Brief Hospital Course: Acute coronary syndrome: Due to LVH/HTN-non-STEMI ruled out (LHC negative for significant stenosis).  Initially managed with aspirin/statin/beta-blocker and IV heparin.   Will be continued on aspirin and statin on discharge.     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  Creatinine improved with supportive care with IVF.  ARB resumed-HCTZ remains on hold.  Follow-up with PCP and repeat electrolytes in 1 week.   HTN: BP better-continue beta-blocker/ARB.     Hypokalemia: Repleted.  Asymptomatic bacteriuria: Does not have any signs of UTI-suspect asymptomatic bacteriuria.  No longer on Rocephin.   Fatigue: Ongoing for the past several weeks-since her recent laparoscopic bilateral salpingectomy in 5/15.  Echo with stable EF, TSH normal, ACTH stimulation test not suggestive of adrenal insufficiency.  Suspect this may have been from dehydration-and chronic diastolic heart failure.     Tobacco abuse: Counseled.   BMI: Estimated body mass index is 27.23 kg/m as calculated from the following:   Height as of this encounter: 5\' 3"  (1.6 m).   Weight as of this encounter: 69.7 kg.     Discharge Diagnoses:  Principal Problem:   NSTEMI (non-ST elevated myocardial infarction) (HCC) Active Problems:   UTI (urinary tract infection)   Hypokalemia   Essential hypertension   ARF (acute renal failure) (HCC)   Dyspnea   Elevated troponin   Discharge Instructions:  Activity:  As tolerated   Discharge Instructions     Call MD for:  difficulty breathing, headache or visual disturbances   Complete by: As directed    Call MD for:  persistant nausea and vomiting   Complete by:  As directed    Diet - low sodium heart healthy   Complete by: As directed    Discharge instructions   Complete by: As directed    Follow with Primary MD  Pllc, [facility] in 1-2 weeks  Please get a complete blood count and chemistry panel checked by your Primary MD at your next visit, and again as instructed by your Primary MD.  Get Medicines reviewed and  adjusted: Please take all your medications with you for your next visit with your Primary MD  Laboratory/radiological data: Please request your Primary MD to go over all hospital tests and procedure/radiological results at the follow up, please ask your Primary MD to get all Hospital records sent to his/her office.  In some cases, they will be blood work, cultures and biopsy results pending at the time of your discharge. Please request that your primary care M.D. follows up on these results.  Also Note the following: If you experience worsening of your admission symptoms, develop shortness of breath, life threatening emergency, suicidal or homicidal thoughts you must seek medical attention immediately by calling 911 or calling your MD immediately  if symptoms less severe.  You must read complete instructions/literature along with all the possible adverse reactions/side effects for all the Medicines you take and that have been prescribed to you. Take any new Medicines after you have completely understood and accpet all the possible adverse reactions/side effects.   Do not drive when taking Pain medications or sleeping medications (Benzodaizepines)  Do not take more than prescribed Pain, Sleep and Anxiety Medications. It is not advisable to combine anxiety,sleep and pain medications without talking with your primary care practitioner  Special Instructions: If you have smoked or chewed Tobacco  in the last 2 yrs please stop smoking, stop any regular Alcohol  and or any Recreational drug use.  Wear Seat belts while driving.  Please note: You were cared for by a hospitalist during your hospital stay. Once you are discharged, your primary care physician will handle any further medical issues. Please note that NO REFILLS for any discharge medications will be authorized once you are discharged, as it is imperative that you return to your primary care physician (or establish a relationship with a  primary care physician if you do not have one) for your post hospital discharge needs so that they can reassess your need for medications and monitor your lab values.   Increase activity slowly   Complete by: As directed       Allergies as of 09/18/2021       Reactions   Adhesive [tape] Rash        Medication List     STOP taking these medications    IBU 800 MG tablet Generic drug: ibuprofen   nitrofurantoin (macrocrystal-monohydrate) 100 MG capsule Commonly known as: MACROBID   olmesartan-hydrochlorothiazide 40-25 MG tablet Commonly known as: BENICAR HCT   oxyCODONE 5 MG immediate release tablet Commonly known as: Roxicodone       TAKE these medications    acetaminophen 325 MG tablet Commonly known as: Tylenol Take 2 tablets (650 mg total) by mouth every 6 (six) hours as needed.   aspirin EC 81 MG tablet Take 1 tablet (81 mg total) by mouth daily. Swallow whole. Start taking on: September 19, 2021   atorvastatin 80 MG tablet Commonly known as: LIPITOR Take 1 tablet (80 mg total) by mouth daily. Start taking on: September 19, 2021   carvedilol 6.25 MG tablet Commonly  known as: COREG Take 1 tablet (6.25 mg total) by mouth 2 (two) times daily with a meal.   empagliflozin 10 MG Tabs tablet Commonly known as: JARDIANCE Take 1 tablet (10 mg total) by mouth daily. Start taking on: September 19, 2021   losartan 25 MG tablet Commonly known as: COZAAR Take 1 tablet (25 mg total) by mouth at bedtime.        Follow-up Information     [facility], [facility] Follow up.   Specialty: Family Medicine Contact information: [address] [place] [address] [phone]         Leanor Kail, PA Follow up on 09/22/2021.   Specialty: Cardiology Why: @10 :55 for hospital follow up Contact information: [place] [address] [phone]                Allergies  Allergen Reactions   Adhesive [Tape] Rash      Other Procedures/Studies: CARDIAC CATHETERIZATION  Result Date: 09/17/2021   Mid LAD lesion is 20% stenosed.   The left ventricular systolic function is normal.   LV end diastolic pressure is moderately elevated.   The left ventricular ejection fraction is greater than 65% by visual estimate.   There is no aortic valve stenosis. No significant CAD.  No evidence of SCAD.  Hyperdynamic LV function.  Short ascending aorta.  JL3 required to engage the left main.  Mild right radial vasospasm noted.  Extra verapamil given during the end of the case.  Continue BP and diastolic heart failure management.   ECHOCARDIOGRAM COMPLETE  Result Date: 09/16/2021    ECHOCARDIOGRAM REPORT   Patient Name:   RAINI SHENBERGER Date of Exam: 09/16/2021 Medical Rec #:  [id number]        Height:       63.0 in Accession #:    [id number]       Weight:       148.6 lb Date of Birth:  [birth year]        BSA:          1.704 m Patient Age:    24 years         BP:           132/80 mmHg Patient Gender: F                HR:           65 bpm. Exam Location:  Inpatient Procedure: Cardiac Doppler, Color Doppler and Intracardiac Opacification Agent Indications:    NSTEMI  History:        Patient has no prior history of Echocardiogram examinations.                 Angina; Signs/Symptoms:Dyspnea. NSTEMI.  Sonographer:    Melissa Morford RDCS (AE, PE) Referring Phys: [place]  1. Left ventricular ejection fraction, by estimation, is 65 to 70%. The left ventricle has normal function. The left ventricle has no regional wall motion abnormalities. There is severe concentric left ventricular hypertrophy, best seen in PSAX views. Left ventricular diastolic parameters are consistent with Grade II diastolic dysfunction (pseudonormalization). No evidence of left apical aneurysm. 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  2. Right ventricular systolic function is normal. The right ventricular size is normal. There  is normal pulmonary artery systolic pressure.  3. The mitral valve is normal in structure. No evidence of mitral valve regurgitation. No evidence of mitral stenosis.  4. The aortic  valve was not well visualized. Aortic valve regurgitation is not visualized. No aortic stenosis is present. Conclusion(s)/Recommendation(s): If negative ischemic work up, cardiac MRI may be considered to assess for etiology of hypertrophy. FINDINGS  Left Ventricle: Left ventricular ejection fraction, by estimation, is 65 to 70%. The left ventricle has normal function. The left ventricle has no regional wall motion abnormalities. The left ventricular internal cavity size was normal in size. There is  severe concentric left ventricular hypertrophy. Left ventricular diastolic parameters are consistent with Grade II diastolic dysfunction (pseudonormalization). Right Ventricle: The right ventricular size is normal. No increase in right ventricular wall thickness. Right ventricular systolic function is normal. There is normal pulmonary artery systolic pressure. The tricuspid regurgitant velocity is 2.48 m/s, and  with an assumed right atrial pressure of 3 mmHg, the estimated right ventricular systolic pressure is 0000000 mmHg. Left Atrium: Left atrial size was normal in size. Right Atrium: Right atrial size was normal in size. Pericardium: There is no evidence of pericardial effusion. Mitral Valve: The mitral valve is normal in structure. No evidence of mitral valve regurgitation. No evidence of mitral valve stenosis. Tricuspid Valve: The tricuspid valve is normal in structure. Tricuspid valve regurgitation is not demonstrated. No evidence of tricuspid stenosis. Aortic Valve: The aortic valve was not well visualized. Aortic valve regurgitation is not visualized. No aortic stenosis is present. Pulmonic Valve: The pulmonic valve was normal in structure. Pulmonic valve regurgitation is not visualized. No evidence of pulmonic stenosis. Aorta: The  aortic root and ascending aorta are structurally normal, with no evidence of dilitation. IAS/Shunts: No atrial level shunt detected by color flow Doppler.  LEFT VENTRICLE PLAX 2D LVOT diam:     1.90 cm   Diastology LV SV:         68        LV e' medial:    6.20 cm/s LV SV Index:   40        LV E/e' medial:  14.1 LVOT Area:     2.84 cm  LV e' lateral:   4.90 cm/s                          LV E/e' lateral: 17.8  RIGHT VENTRICLE RV S prime:     12.50 cm/s TAPSE (M-mode): 1.4 cm LEFT ATRIUM             Index        RIGHT ATRIUM           Index LA Vol (A2C):   28.8 ml 16.90 ml/m  RA Area:     11.00 cm LA Vol (A4C):   33.4 ml 19.60 ml/m  RA Volume:   22.10 ml  12.97 ml/m LA Biplane Vol: 33.2 ml 19.48 ml/m  AORTIC VALVE LVOT Vmax:   112.00 cm/s LVOT Vmean:  83.100 cm/s LVOT VTI:    0.241 m  AORTA Ao Asc diam: 2.65 cm MITRAL VALVE               TRICUSPID VALVE MV Area (PHT): 3.93 cm    TR Peak grad:   24.6 mmHg MV Decel Time: 193 msec    TR Vmax:        248.00 cm/s MV E velocity: 87.40 cm/s MV A velocity: 73.70 cm/s  SHUNTS MV E/A ratio:  1.19        Systemic VTI:  0.24 m  Systemic Diam: 1.90 cm Rudean Haskell MD Electronically signed by Rudean Haskell MD Signature Date/Time: 09/16/2021/1:10:54 PM    Final    CT CHEST ABDOMEN PELVIS WO CONTRAST  Result Date: 09/15/2021 CLINICAL DATA:  Sepsis, shortness of breath, fatigue, blurred vision, aching, history of hypertension, LEFT renal atrophy EXAM: CT CHEST, ABDOMEN AND PELVIS WITHOUT CONTRAST TECHNIQUE: Multidetector CT imaging of the chest, abdomen and pelvis was performed following the standard protocol without IV contrast. RADIATION DOSE REDUCTION: This exam was performed according to the departmental dose-optimization program which includes automated exposure control, adjustment of the mA and/or kV according to patient size and/or use of iterative reconstruction technique. COMPARISON:  CT abdomen and pelvis 05/06/2021 FINDINGS:  CT CHEST FINDINGS Cardiovascular: Aorta normal caliber. Heart unremarkable. No pericardial effusion. Mediastinum/Nodes: Esophagus normal appearance. Base of cervical region unremarkable. No thoracic adenopathy. Lungs/Pleura: Mild dependent atelectasis in LEFT lower lobe. Remaining lungs clear. No infiltrate, pleural effusion, pneumothorax, or mass. Musculoskeletal: Unremarkable CT ABDOMEN PELVIS FINDINGS Hepatobiliary: Gallbladder and liver normal appearance Pancreas: Normal appearance Spleen: Normal appearance Adrenals/Urinary Tract: Adrenal glands normal appearance. Atrophic LEFT kidney with chronic mild hydronephrosis. Tiny calculus inferior pole LEFT kidney. RIGHT hydronephrosis again identified though no definite ureteral calcification or dilatation is seen. Mild diffuse bladder wall thickening. No renal masses. Stomach/Bowel: Appendix surgically absent by history. Stomach and bowel loops normal appearance. Vascular/Lymphatic: Aorta normal caliber.  No adenopathy. Reproductive: Uterus and ovaries normal appearance. Tampon in vagina. Other: No free air or free fluid. No hernia or inflammatory process. Musculoskeletal: Unremarkable IMPRESSION: Mild dependent atelectasis in LEFT lower lobe. Atrophic LEFT kidney with chronic mild hydronephrosis. RIGHT hydronephrosis again identified though no definite ureteral calcification or dilatation is seen. Tiny nonobstructing inferior pole LEFT renal calculus. Mild chronic bladder wall thickening question cystitis, recommend correlation with urinalysis. No acute intrathoracic, intra-abdominal, or intrapelvic abnormalities. Electronically Signed   By: Lavonia Dana M.D.   On: 09/15/2021 18:09   DG Chest Port 1 View  Result Date: 09/15/2021 CLINICAL DATA:  Shortness of breath since last Thursday EXAM: PORTABLE CHEST 1 VIEW COMPARISON:  Chest radiograph 07/22/2019 FINDINGS: Cardiomediastinal silhouette is normal. There is no focal consolidation or pulmonary edema. There is no  pleural effusion or pneumothorax. A probable calcified granuloma in the right upper lobe is unchanged. There is no acute osseous abnormality. IMPRESSION: No radiographic evidence of acute cardiopulmonary process. Electronically Signed   By: Valetta Mole M.D.   On: 09/15/2021 14:55     TODAY-DAY OF DISCHARGE:  Subjective:   Latricia Evora today has no headache,no chest abdominal pain,no new weakness tingling or numbness, feels much better wants to go home today.  Objective:   Blood pressure (!) 155/86, pulse 87, temperature 97.8 F (36.6 C), temperature source Oral, resp. rate 17, height 5\' 3"  (1.6 m), weight 70.1 kg, last menstrual period 09/11/2021, SpO2 95 %.  Intake/Output Summary (Last 24 hours) at 09/18/2021 0959 Last data filed at 09/18/2021 0413 Gross per 24 hour  Intake --  Output 350 ml  Net -350 ml   Filed Weights   09/16/21 0256 09/17/21 0419 09/18/21 0410  Weight: 67.4 kg 69.7 kg 70.1 kg    Exam: Awake Alert, Oriented *3, No new F.N deficits, Normal affect Lloyd.AT,PERRAL Supple Neck,No JVD, No cervical lymphadenopathy appriciated.  Symmetrical Chest wall movement, Good air movement bilaterally, CTAB RRR,No Gallops,Rubs or new Murmurs, No Parasternal Heave +ve B.Sounds, Abd Soft, Non tender, No organomegaly appriciated, No rebound -guarding or rigidity. No Cyanosis, Clubbing or edema, No new  Rash or bruise   PERTINENT RADIOLOGIC STUDIES: CARDIAC CATHETERIZATION  Result Date: 09/17/2021   Mid LAD lesion is 20% stenosed.   The left ventricular systolic function is normal.   LV end diastolic pressure is moderately elevated.   The left ventricular ejection fraction is greater than 65% by visual estimate.   There is no aortic valve stenosis. No significant CAD.  No evidence of SCAD.  Hyperdynamic LV function.  Short ascending aorta.  JL3 required to engage the left main.  Mild right radial vasospasm noted.  Extra verapamil given during the end of the case.  Continue BP and  diastolic heart failure management.   ECHOCARDIOGRAM COMPLETE  Result Date: 09/16/2021    ECHOCARDIOGRAM REPORT   Patient Name:   ADREANNE LEVAR Date of Exam: 09/16/2021 Medical Rec #:  [id number]        Height:       63.0 in Accession #:    [id number]       Weight:       148.6 lb Date of Birth:  30-May-1980        BSA:          1.704 m Patient Age:    12 years         BP:           132/80 mmHg Patient Gender: F                HR:           65 bpm. Exam Location:  Inpatient Procedure: Cardiac Doppler, Color Doppler and Intracardiac Opacification Agent Indications:    NSTEMI  History:        Patient has no prior history of Echocardiogram examinations.                 Angina; Signs/Symptoms:Dyspnea. NSTEMI.  Sonographer:    Melissa Morford RDCS (AE, PE) Referring Phys: [place]  1. Left ventricular ejection fraction, by estimation, is 65 to 70%. The left ventricle has normal function. The left ventricle has no regional wall motion abnormalities. There is severe concentric left ventricular hypertrophy, best seen in PSAX views. Left ventricular diastolic parameters are consistent with Grade II diastolic dysfunction (pseudonormalization). No evidence of left apical aneurysm. 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  2. Right ventricular systolic function is normal. The right ventricular size is normal. There is normal pulmonary artery systolic pressure.  3. The mitral valve is normal in structure. No evidence of mitral valve regurgitation. No evidence of mitral stenosis.  4. The aortic valve was not well visualized. Aortic valve regurgitation is not visualized. No aortic stenosis is present. Conclusion(s)/Recommendation(s): If negative ischemic work up, cardiac MRI may be considered to assess for etiology of hypertrophy. FINDINGS  Left Ventricle: Left ventricular ejection fraction, by estimation, is 65 to 70%. The left ventricle has normal function. The left ventricle has no  regional wall motion abnormalities. The left ventricular internal cavity size was normal in size. There is  severe concentric left ventricular hypertrophy. Left ventricular diastolic parameters are consistent with Grade II diastolic dysfunction (pseudonormalization). Right Ventricle: The right ventricular size is normal. No increase in right ventricular wall thickness. Right ventricular systolic function is normal. There is normal pulmonary artery systolic pressure. The tricuspid regurgitant velocity is 2.48 m/s, and  with an assumed right atrial pressure of 3 mmHg, the estimated right ventricular systolic pressure is 0000000 mmHg. Left Atrium: Left atrial size was normal in size. Right Atrium:  Right atrial size was normal in size. Pericardium: There is no evidence of pericardial effusion. Mitral Valve: The mitral valve is normal in structure. No evidence of mitral valve regurgitation. No evidence of mitral valve stenosis. Tricuspid Valve: The tricuspid valve is normal in structure. Tricuspid valve regurgitation is not demonstrated. No evidence of tricuspid stenosis. Aortic Valve: The aortic valve was not well visualized. Aortic valve regurgitation is not visualized. No aortic stenosis is present. Pulmonic Valve: The pulmonic valve was normal in structure. Pulmonic valve regurgitation is not visualized. No evidence of pulmonic stenosis. Aorta: The aortic root and ascending aorta are structurally normal, with no evidence of dilitation. IAS/Shunts: No atrial level shunt detected by color flow Doppler.  LEFT VENTRICLE PLAX 2D LVOT diam:     1.90 cm   Diastology LV SV:         68        LV e' medial:    6.20 cm/s LV SV Index:   40        LV E/e' medial:  14.1 LVOT Area:     2.84 cm  LV e' lateral:   4.90 cm/s                          LV E/e' lateral: 17.8  RIGHT VENTRICLE RV S prime:     12.50 cm/s TAPSE (M-mode): 1.4 cm LEFT ATRIUM             Index        RIGHT ATRIUM           Index LA Vol (A2C):   28.8 ml 16.90 ml/m   RA Area:     11.00 cm LA Vol (A4C):   33.4 ml 19.60 ml/m  RA Volume:   22.10 ml  12.97 ml/m LA Biplane Vol: 33.2 ml 19.48 ml/m  AORTIC VALVE LVOT Vmax:   112.00 cm/s LVOT Vmean:  83.100 cm/s LVOT VTI:    0.241 m  AORTA Ao Asc diam: 2.65 cm MITRAL VALVE               TRICUSPID VALVE MV Area (PHT): 3.93 cm    TR Peak grad:   24.6 mmHg MV Decel Time: 193 msec    TR Vmax:        248.00 cm/s MV E velocity: 87.40 cm/s MV A velocity: 73.70 cm/s  SHUNTS MV E/A ratio:  1.19        Systemic VTI:  0.24 m                            Systemic Diam: 1.90 cm Rudean Haskell MD Electronically signed by Rudean Haskell MD Signature Date/Time: 09/16/2021/1:10:54 PM    Final      PERTINENT LAB RESULTS: CBC: Recent Labs    09/16/21 0331 09/17/21 0249  WBC 12.5* 10.6*  HGB 11.4* 11.1*  HCT 34.8* 34.4*  PLT 326 271   CMET CMP     Component Value Date/Time   NA 139 09/18/2021 0535   K 3.2 (L) 09/18/2021 0535   CL 113 (H) 09/18/2021 0535   CO2 20 (L) 09/18/2021 0535   GLUCOSE 157 (H) 09/18/2021 0535   BUN 21 (H) 09/18/2021 0535   CREATININE 1.44 (H) 09/18/2021 0535   CALCIUM 8.3 (L) 09/18/2021 0535   PROT 5.5 (L) 09/17/2021 0249   ALBUMIN 2.4 (L) 09/17/2021 0249   AST 37 09/17/2021 0249  ALT 31 09/17/2021 0249   ALKPHOS 61 09/17/2021 0249   BILITOT 0.3 09/17/2021 0249   GFRNONAA 47 (L) 09/18/2021 0535   GFRAA >60 10/25/2019 0845    GFR Estimated Creatinine Clearance: 48.3 mL/min (A) (by C-G formula based on SCr of 1.44 mg/dL (H)). No results for input(s): "LIPASE", "AMYLASE" in the last 72 hours. Recent Labs    09/15/21 2226 09/17/21 0249  CKTOTAL 513* 211  CKMB 43.4*  --    Invalid input(s): "POCBNP" Recent Labs    09/15/21 1429  DDIMER 0.49   No results for input(s): "HGBA1C" in the last 72 hours. Recent Labs    09/16/21 0326  CHOL 154  HDL 22*  LDLCALC 96  TRIG 829*  CHOLHDL 7.0   Recent Labs    09/16/21 0326  TSH 2.063   No results for input(s):  "VITAMINB12", "FOLATE", "FERRITIN", "TIBC", "IRON", "RETICCTPCT" in the last 72 hours. Coags: Recent Labs    09/15/21 1429  INR 1.0   Microbiology: Recent Results (from the past 240 hour(s))  Blood Culture (routine x 2)     Status: None (Preliminary result)   Collection Time: 09/15/21  2:29 PM   Specimen: Left Antecubital; Blood  Result Value Ref Range Status   Specimen Description   Final    LEFT ANTECUBITAL BOTTLES DRAWN AEROBIC AND ANAEROBIC   Special Requests Blood Culture adequate volume  Final   Culture   Final    NO GROWTH 2 DAYS Performed at [facility], [address], [place], [place] [address]    Report Status PENDING  Incomplete  Blood Culture (routine x 2)     Status: None (Preliminary result)   Collection Time: 09/15/21  2:59 PM   Specimen: Right Antecubital; Blood  Result Value Ref Range Status   Specimen Description   Final    RIGHT ANTECUBITAL BOTTLES DRAWN AEROBIC AND ANAEROBIC   Special Requests Blood Culture adequate volume  Final   Culture   Final    NO GROWTH 2 DAYS Performed at [facility], [address] [place], [place] [address]    Report Status PENDING  Incomplete  Urine Culture     Status: Abnormal   Collection Time: 09/15/21  7:14 PM   Specimen: Urine, Clean Catch  Result Value Ref Range Status   Specimen Description   Final    URINE, CLEAN CATCH Performed at [facility], [address] [place], [place] [address]    Special Requests   Final    NONE Performed at [facility], [address], [place], [place] [address]    Culture MULTIPLE SPECIES PRESENT, SUGGEST RECOLLECTION (A)  Final   Report Status 09/17/2021 FINAL  Final    FURTHER DISCHARGE INSTRUCTIONS:  Get Medicines reviewed and adjusted: Please take all your medications with you for your next visit with your Primary MD  Laboratory/radiological data: Please request your Primary MD to go over all hospital tests and procedure/radiological results at the follow up, please ask  your Primary MD to get all Hospital records sent to his/her office.  In some cases, they will be blood work, cultures and biopsy results pending at the time of your discharge. Please request that your primary care M.D. goes through all the records of your hospital data and follows up on these results.  Also Note the following: If you experience worsening of your admission symptoms, develop shortness of breath, life threatening emergency, suicidal or homicidal thoughts you must seek medical attention immediately by calling 911 or  calling your MD immediately  if symptoms less severe.  You must read complete instructions/literature along with all the possible adverse reactions/side effects for all the Medicines you take and that have been prescribed to you. Take any new Medicines after you have completely understood and accpet all the possible adverse reactions/side effects.   Do not drive when taking Pain medications or sleeping medications (Benzodaizepines)  Do not take more than prescribed Pain, Sleep and Anxiety Medications. It is not advisable to combine anxiety,sleep and pain medications without talking with your primary care practitioner  Special Instructions: If you have smoked or chewed Tobacco  in the last 2 yrs please stop smoking, stop any regular Alcohol  and or any Recreational drug use.  Wear Seat belts while driving.  Please note: You were cared for by a hospitalist during your hospital stay. Once you are discharged, your primary care physician will handle any further medical issues. Please note that NO REFILLS for any discharge medications will be authorized once you are discharged, as it is imperative that you return to your primary care physician (or establish a relationship with a primary care physician if you do not have one) for your post hospital discharge needs so that they can reassess your need for medications and monitor your lab values.  Total Time spent coordinating  discharge including counseling, education and face to face time equals greater than 30 minutes.  SignedOren Binet 09/18/2021 9:59 AM

## 2021-09-18 NOTE — TOC Benefit Eligibility Note (Signed)
Patient Product/process development scientist completed.    The patient is currently admitted and upon discharge could be taking Farixga 10 mg.  The current 30 day co-pay is $162.02.   The patient is currently admitted and upon discharge could be taking Jardiance 10 mg.  The current 30 day co-pay is $170.05.   The patient is insured through [employer]    Roland Earl, CPhT Pharmacy Patient Advocate Specialist [facility] Patient Advocate Team Direct Number: [phone]  Fax: [phone]

## 2021-09-20 LAB — CULTURE, BLOOD (ROUTINE X 2)
Culture: NO GROWTH
Culture: NO GROWTH
Special Requests: ADEQUATE
Special Requests: ADEQUATE

## 2021-09-22 ENCOUNTER — Encounter: Payer: Self-pay | Admitting: Physician Assistant

## 2021-09-22 ENCOUNTER — Ambulatory Visit (INDEPENDENT_AMBULATORY_CARE_PROVIDER_SITE_OTHER): Payer: 59 | Admitting: Physician Assistant

## 2021-09-22 VITALS — BP 142/90 | HR 64 | Ht 63.0 in | Wt 154.0 lb

## 2021-09-22 DIAGNOSIS — I119 Hypertensive heart disease without heart failure: Secondary | ICD-10-CM | POA: Diagnosis not present

## 2021-09-22 DIAGNOSIS — Z79899 Other long term (current) drug therapy: Secondary | ICD-10-CM

## 2021-09-22 DIAGNOSIS — I10 Essential (primary) hypertension: Secondary | ICD-10-CM

## 2021-09-22 DIAGNOSIS — N1831 Chronic kidney disease, stage 3a: Secondary | ICD-10-CM

## 2021-09-22 LAB — BASIC METABOLIC PANEL
BUN/Creatinine Ratio: 8 — ABNORMAL LOW (ref 9–23)
BUN: 11 mg/dL (ref 6–24)
CO2: 23 mmol/L (ref 20–29)
Calcium: 8.9 mg/dL (ref 8.7–10.2)
Chloride: 107 mmol/L — ABNORMAL HIGH (ref 96–106)
Creatinine, Ser: 1.31 mg/dL — ABNORMAL HIGH (ref 0.57–1.00)
Glucose: 72 mg/dL (ref 70–99)
Potassium: 4.7 mmol/L (ref 3.5–5.2)
Sodium: 139 mmol/L (ref 134–144)
eGFR: 52 mL/min/{1.73_m2} — ABNORMAL LOW (ref >59)

## 2021-09-22 NOTE — Progress Notes (Signed)
Cardiology Office Note:    Date:  09/22/2021   ID:  [name], DOB 03-18-1980, MRN [id number]  PCP:  [facility]  [facility] Cardiologist:  None  [facility] Electrophysiologist:  None   Chief Complaint: Hospital follow up   History of Present Illness:    [name] is a 41 y.o. female with a hx of hypertension, anxiety/depression,CKD,  pyelonephritis, nephrolithiasis and tobacco abuse who  seen for hospital follow up.   Admitted last week with NSTEMI. Cath showed mild CAD with elevated LVEDP. Felt ACS likely due to LVH/HTN and diastolic dysfunction. Added jardiance and losartan. Plan to consider cMRI as outpatient.   Patient is here for follow-up.  Her breathing is improving.  No chest pain, orthopnea, PND, syncope, lower extremity edema or melena.  Has a blood pressure cuff at home but has not checked it.  Past Medical History:  Diagnosis Date   Anxiety    Atrophy of left kidney    Depression    History of kidney stones    Hydrosalpinx    Hypertension    followed by pcp   Nephrolithiasis    per CT 05-06-2021  right renal stone nonostructive   Pain, female pelvic    Urgency of urination     Past Surgical History:  Procedure Laterality Date   APPENDECTOMY  age 89   ARTHOSCOPIC 49 CUFF REPAIR Right 10/31/2019   Procedure: ARTHROSCOPIC ROTATOR CUFF REPAIR;  Surgeon: Vickki Hearing, MD;  Location: [facility];  Service: Orthopedics;  Laterality: Right;   BALLOON DILATION Left 01/17/2013   Procedure: BALLOON DILATION OF LEFT URETERAL STRICTURE;  Surgeon: Ky Barban, MD;  Location: [facility];  Service: Urology;  Laterality: Left;   CYSTOSCOPY W/ URETERAL STENT PLACEMENT Left 01/17/2013   Procedure: CYSTOSCOPY WITH LEFT RETROGRADE PYELOGRAM; LEFT URETERAL STENT PLACEMENT;  Surgeon: Ky Barban, MD;  Location: [facility];  Service: Urology;  Laterality: Left;   CYSTOSCOPY WITH RETROGRADE PYELOGRAM, URETEROSCOPY AND STENT PLACEMENT  Bilateral 12/23/2018   Procedure: CYSTOSCOPY WITH RETROGRADE PYELOGRAM, URETEROSCOPY AND STENT PLACEMENT;  Surgeon: Sebastian Ache, MD;  Location: [facility];  Service: Urology;  Laterality: Bilateral;  90 MINS   CYSTOSCOPY WITH RETROGRADE PYELOGRAM, URETEROSCOPY AND STENT PLACEMENT Bilateral 01/06/2019   Procedure: CYSTOSCOPY WITH RETROGRADE PYELOGRAM, URETEROSCOPY AND STENT PLACEMENT;  Surgeon: Sebastian Ache, MD;  Location: [facility];  Service: Urology;  Laterality: Bilateral;  90 MINS   CYSTOSCOPY WITH RETROGRADE PYELOGRAM, URETEROSCOPY AND STENT PLACEMENT Bilateral 11/15/2020   Procedure: CYSTOSCOPY WITH RETROGRADE PYELOGRAM, LEFT DIAGNOSTIC URETEROSCOPY, RIGHT DIAGNOSTIC URETEROSCOPY WITH BIOPSY; RIGHT STENT PLACEMENT;  Surgeon: Sebastian Ache, MD;  Location: [facility];  Service: Urology;  Laterality: Bilateral;  75 MINS   CYSTOSCOPY/URETEROSCOPY/HOLMIUM LASER/STENT PLACEMENT  2008;  2010;  2011;  2012   FLEXIBLE URETEROSCOPY Left 01/17/2013   Procedure: FLEXIBLE LEFT URETEROSCOPY; ATTEMPTED STONE BASKET EXTRACTION;  Surgeon: Ky Barban, MD;  Location: [facility];  Service: Urology;  Laterality: Left;   HOLMIUM LASER APPLICATION Bilateral 12/23/2018   Procedure: HOLMIUM LASER APPLICATION;  Surgeon: Sebastian Ache, MD;  Location: [facility];  Service: Urology;  Laterality: Bilateral;   HOLMIUM LASER APPLICATION Bilateral 01/06/2019   Procedure: HOLMIUM LASER APPLICATION;  Surgeon: Sebastian Ache, MD;  Location: [facility];  Service: Urology;  Laterality: Bilateral;   LAPAROSCOPIC BILATERAL SALPINGECTOMY Bilateral 06/30/2021   Procedure: LAPAROSCOPIC BILATERAL SALPINGECTOMY; LYSIS OF ADHESIONS;  Surgeon: Lyn Henri, MD;  Location: [place];  Service: Gynecology;  Laterality: Bilateral;   LEFT HEART CATH AND CORONARY ANGIOGRAPHY N/A 09/17/2021   Procedure: LEFT HEART CATH AND CORONARY  ANGIOGRAPHY;  Surgeon: Jettie Booze, MD;  Location: [place] [facility];  Service: Cardiovascular;  Laterality: N/A;   PERCUTANEOUS NEPHROSTOLITHOTOMY  06/15/2011   @[facility]     Current Medications: Current Meds  Medication Sig   aspirin EC 81 MG tablet Take 1 tablet (81 mg total) by mouth daily. Swallow whole.   atorvastatin (LIPITOR) 80 MG tablet Take 1 tablet (80 mg total) by mouth daily.   carvedilol (COREG) 6.25 MG tablet Take 1 tablet (6.25 mg total) by mouth 2 (two) times daily with a meal.   empagliflozin (JARDIANCE) 10 MG TABS tablet Take 1 tablet (10 mg total) by mouth daily.   losartan (COZAAR) 25 MG tablet Take 1 tablet (25 mg total) by mouth at bedtime.     Allergies:   Adhesive [tape]   Social History   Socioeconomic History   Marital status: Single    Spouse name: Not on file   Number of children: Not on file   Years of education: Not on file   Highest education level: Not on file  Occupational History   Not on file  Tobacco Use   Smoking status: Former    Types: Cigars   Smokeless tobacco: Never   Tobacco comments:    06-25-2021  per pt 6 per week, black mal small cigar's  Vaping Use   Vaping Use: Never used  Substance and Sexual Activity   Alcohol use: Yes    Comment: occasional   Drug use: Never   Sexual activity: Not on file  Other Topics Concern   Not on file  Social History Narrative   Not on file   Social Determinants of Health   Financial Resource Strain: Not on file  Food Insecurity: Not on file  Transportation Needs: Not on file  Physical Activity: Not on file  Stress: Not on file  Social Connections: Not on file     Family History: The patient's family history includes Hypertension in her brother, father, and mother; Thyroid disease in her father. There is no history of CAD.    ROS:   Please see the history of present illness.    All other systems reviewed and are negative.   EKGs/Labs/Other Studies Reviewed:    The  following studies were reviewed today:  Cath 8/2: Mid LAD lesion is 20% stenosed.   The left ventricular systolic function is normal.   LV end diastolic pressure is moderately elevated.   The left ventricular ejection fraction is greater than 65% by visual estimate.   There is no aortic valve stenosis.   TTE 8/1: 1. Left ventricular ejection fraction, by estimation, is 65 to 70%. The  left ventricle has normal function. The left ventricle has no regional  wall motion abnormalities. There is severe concentric left ventricular  hypertrophy, best seen in PSAX views.  Left ventricular diastolic parameters are consistent with Grade II  diastolic dysfunction (pseudonormalization). No evidence of left apical  aneurysm. 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   2. Right ventricular systolic function is normal. The right ventricular  size is normal. There is normal pulmonary artery systolic pressure.   3. The mitral valve is normal in structure. No evidence of mitral valve  regurgitation. No evidence of mitral stenosis.   4. The aortic valve was not well visualized. Aortic valve  regurgitation  is not visualized. No aortic stenosis is present.   Conclusion(s)/Recommendation(s): If negative ischemic work up, cardiac MRI  may be considered to assess for etiology of hypertrophy.     EKG:  EKG is not ordered today.    Recent Labs: 09/16/2021: Magnesium 2.0; TSH 2.063 09/17/2021: ALT 31; Hemoglobin 11.1; Platelets 271 09/18/2021: BUN 21; Creatinine, Ser 1.44; Potassium 3.2; Sodium 139  Recent Lipid Panel    Component Value Date/Time   CHOL 154 09/16/2021 0326   TRIG 181 (H) 09/16/2021 0326   HDL 22 (L) 09/16/2021 0326   CHOLHDL 7.0 09/16/2021 0326   VLDL 36 09/16/2021 0326   LDLCALC 96 09/16/2021 0326    Physical Exam:    VS:  BP (!) 142/90   Pulse 64   Ht 5\' 3"  (1.6 m)   Wt 154 lb (69.9 kg)   LMP 09/11/2021   BMI 27.28 kg/m     Wt Readings from Last 3  Encounters:  09/22/21 154 lb (69.9 kg)  09/18/21 154 lb 8 oz (70.1 kg)  06/30/21 143 lb 8 oz (65.1 kg)     GEN:  Well nourished, well developed in no acute distress HEENT: Normal NECK: No JVD; No carotid bruits LYMPHATICS: No lymphadenopathy CARDIAC: RRR, no murmurs, rubs, gallops RESPIRATORY:  Clear to auscultation without rales, wheezing or rhonchi  ABDOMEN: Soft, non-tender, non-distended MUSCULOSKELETAL:  No edema; No deformity  SKIN: Warm and dry NEUROLOGIC:  Alert and oriented x 3 PSYCHIATRIC:  Normal affect   ASSESSMENT AND PLAN:    LVH/diastolic dysfunction - Echo with LVEF of 65-70%m grade II DD and hypertrophy. 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  - Get cMRI at next office visit (wants to defer now until blood pressure gets better)  - Continue Losartan and jardiance  2. HTN -Continue carvedilol and losartan.  Medication adjustment based on renal function checked today.  3. CKD  - Seems baseline 1.2-1.4 - Recheck BMP today   4.  Tobacco abuse -Congratulated on cessation.   Medication Adjustments/Labs and Tests Ordered: Current medicines are reviewed at length with the patient today.  Concerns regarding medicines are outlined above.  Orders Placed This Encounter  Procedures   Basic metabolic panel   No orders of the defined types were placed in this encounter.   Patient Instructions  Medication Instructions:  Your physician recommends that you continue on your current medications as directed. Please refer to the Current Medication list given to you today.    *If you need a refill on your cardiac medications before your next appointment, please call your pharmacy*   Lab Work: TODAY:  BMET  If you have labs (blood work) drawn today and your tests are completely normal, you will receive your results only by: [place] (if you have MyChart) OR A paper copy in the mail If you have any lab test that is abnormal or we need to change your treatment, we  will call you to review the results.   Testing/Procedures: None ordered   Follow-Up: At [facility], you and your health needs are our priority.  As part of our continuing mission to provide you with exceptional heart care, we have created designated Provider Care Teams.  These Care Teams include your primary Cardiologist (physician) and Advanced Practice Providers (APPs -  Physician Assistants and Nurse Practitioners) who all work together to provide you with the care you need, when you need it.  We recommend signing up for the patient portal called "MyChart".  Sign  up information is provided on this After Visit Summary.  MyChart is used to connect with patients for Virtual Visits (Telemedicine).  Patients are able to view lab/test results, encounter notes, upcoming appointments, etc.  Non-urgent messages can be sent to your provider as well.   To learn more about what you can do with MyChart, go to [email or web address].    Your next appointment:   2-3 WEEKS   The format for your next appointment:   In Person  Provider:   You may see 1ST AVAILABLE MD  or one of the following Advanced Practice Providers on your designated Care Team:   Randall An, PA-C  Jacolyn Reedy, PA-C     Other Instructions   Important Information About Sugar         Signed, Manson Passey, [place]  09/22/2021 11:20 AM     [facility]

## 2021-09-22 NOTE — Patient Instructions (Signed)
Medication Instructions:  Your physician recommends that you continue on your current medications as directed. Please refer to the Current Medication list given to you today.    *If you need a refill on your cardiac medications before your next appointment, please call your pharmacy*   Lab Work: TODAY:  BMET  If you have labs (blood work) drawn today and your tests are completely normal, you will receive your results only by: MyChart Message (if you have MyChart) OR A paper copy in the mail If you have any lab test that is abnormal or we need to change your treatment, we will call you to review the results.   Testing/Procedures: None ordered   Follow-Up: At [facility], you and your health needs are our priority.  As part of our continuing mission to provide you with exceptional heart care, we have created designated Provider Care Teams.  These Care Teams include your primary Cardiologist (physician) and Advanced Practice Providers (APPs -  Physician Assistants and Nurse Practitioners) who all work together to provide you with the care you need, when you need it.  We recommend signing up for the patient portal called "MyChart".  Sign up information is provided on this After Visit Summary.  MyChart is used to connect with patients for Virtual Visits (Telemedicine).  Patients are able to view lab/test results, encounter notes, upcoming appointments, etc.  Non-urgent messages can be sent to your provider as well.   To learn more about what you can do with MyChart, go to [email or web address].    Your next appointment:   2-3 WEEKS   The format for your next appointment:   In Person  Provider:   You may see 1ST AVAILABLE MD  or one of the following Advanced Practice Providers on your designated Care Team:   [place], PA-C  Jacolyn Reedy, PA-C     Other Instructions   Important Information About Sugar

## 2021-09-23 ENCOUNTER — Other Ambulatory Visit: Payer: Self-pay

## 2021-09-23 MED ORDER — CARVEDILOL 12.5 MG PO TABS: 6.2500 mg | ORAL_TABLET | Freq: Two times a day (BID) | ORAL | 3 refills | Status: DC

## 2021-09-28 ENCOUNTER — Encounter (HOSPITAL_COMMUNITY): Payer: Self-pay

## 2021-10-20 ENCOUNTER — Encounter: Payer: Self-pay | Admitting: Physician Assistant

## 2021-10-20 NOTE — Progress Notes (Addendum)
Cardiology Office Note    Date:  10/21/2021   ID:  MIKIALA FUGETT, DOB [birth year], MRN [id number]  PCP:  [facility]  Cardiologist:  Verne Carrow, MD during hospitalization but will eventually need to establish with [facility]. Electrophysiologist:  None   Chief Complaint: f/u HTN, LVH  History of Present Illness:   [name] is a 41 y.o. female with history of HTN, anxiety, depression, CKD stage 3a, tobacco abuse, pyelonephritis, nephrolithasis, [place] 08/2021 with minimal CAD and possible chronic HFpEF +/- hypertrophic cardiomyopathy who is seen for follow-up.  She was admitted 09/15/21 with SOB, fatigue, blurry vision and found to have elevated troponin, AKI on CKD, hypokalemia, and asymptomatic bacteruria. There was possible question of dehydration per IM notes. Labs also showed an albumin of 2.4 at discharge though UA did not show significant proteinuria. 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 Cath 09/17/21 with 20% mLAD, moderately elevated LVEDP. Findings felt c/w LVH/HTN and diastolic dysfunction. Outpatient cMRI was recommended to evaluate for HCM. She was switched from olmesartan-HCT to carvedilol 6.25mg  BID, Jardiance 10mg  daily, and losartan 25mg  QHS. She saw Vin Bhagat PA-C in followup 09/22/21. The patient wished to defer MRI until BP was better. Per result note the plan was to increase carvedilol to 12.5mg  BID. The rx appears to have been transmitted as a 12.5mg  tablet to take 0.5 tablets (6.25mg ) BID but the patient does affirm she received verbal instructions to take 2 of her 6.25mg  tablets twice daily, so has been effectively taking 12.5mg  BID.  She is seen today for follow-up. She reports generalized vague frontal headache over the last week, no other focal neurologic symptoms. She has not been checking her BP at home. No CP or SOB. She does report some ankle swelling which does  not appear clinically significant on exam. She recalls having elevated BP for at least the last 2-3 years. She also recalls sometime last year having her mom comment that she would stop breathing during sleep several times, but has never been tested for sleep apnea. She denies any family history of cardiomyopathies. She has a history of kidney stones and has seen Dr. but has not previously had to see nephrology.   Labwork independently reviewed: 09/22/21 K 4.7, Cr 1.31 Earlier 09/2021 albumin 2.4, AST/ALT OK, Hgb 11.1, Mg OK, LDL 96, trig 181, TSH wnl, Lpa 97.1, UDS neg, UA neg for protein  Cardiology Studies:   Studies reviewed are outlined and summarized above. Reports included below if pertinent.   09/2021   Mid LAD lesion is 20% stenosed.   The left ventricular systolic function is normal.   LV end diastolic pressure is moderately elevated.   The left ventricular ejection fraction is greater than 65% by visual estimate.   There is no aortic valve stenosis.   No significant CAD.  No evidence of SCAD.  Hyperdynamic LV function.  Short ascending aorta.  JL3 required to engage the left main.  Mild right radial vasospasm noted.  Extra verapamil given during the end of the case.    Continue BP and diastolic heart failure management.   2d echo 09/16/21   1. Left ventricular ejection fraction, by estimation, is 65 to 70%. The  left ventricle has normal function. The left ventricle has no regional  wall motion abnormalities. There is severe concentric left ventricular  hypertrophy, best seen in PSAX views.  Left ventricular diastolic parameters are consistent with Grade II  diastolic dysfunction (pseudonormalization). No evidence of left apical  aneurysm. 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   2. Right ventricular systolic function is normal. The right ventricular  size is normal. There is normal pulmonary artery systolic pressure.   3. The mitral valve is  normal in structure. No evidence of mitral valve  regurgitation. No evidence of mitral stenosis.   4. The aortic valve was not well visualized. Aortic valve regurgitation  is not visualized. No aortic stenosis is present.   Conclusion(s)/Recommendation(s): If negative ischemic work up, cardiac MRI  may be considered to assess for etiology of hypertrophy.     Past Medical History:  Diagnosis Date   Anxiety    Atrophy of left kidney    Chronic heart failure with preserved ejection fraction (HFpEF) (HCC)    Chronic kidney disease, stage 3a (HCC)    Depression    History of kidney stones    Hydrosalpinx    Hypertension    followed by pcp   Mild CAD    Nephrolithiasis    per CT 05-06-2021  right renal stone nonostructive   Pain, female pelvic    Urgency of urination     Past Surgical History:  Procedure Laterality Date   APPENDECTOMY  age 42   ARTHOSCOPIC 81 CUFF REPAIR Right 10/31/2019   Procedure: ARTHROSCOPIC ROTATOR CUFF REPAIR;  Surgeon: Carole Civil, MD;  Location: [facility];  Service: Orthopedics;  Laterality: Right;   BALLOON DILATION Left 01/17/2013   Procedure: BALLOON DILATION OF LEFT URETERAL STRICTURE;  Surgeon: Marissa Nestle, MD;  Location: [facility];  Service: Urology;  Laterality: Left;   CYSTOSCOPY W/ URETERAL STENT PLACEMENT Left 01/17/2013   Procedure: CYSTOSCOPY WITH LEFT RETROGRADE PYELOGRAM; LEFT URETERAL STENT PLACEMENT;  Surgeon: Marissa Nestle, MD;  Location: [facility];  Service: Urology;  Laterality: Left;   CYSTOSCOPY WITH RETROGRADE PYELOGRAM, URETEROSCOPY AND STENT PLACEMENT Bilateral 12/23/2018   Procedure: CYSTOSCOPY WITH RETROGRADE PYELOGRAM, URETEROSCOPY AND STENT PLACEMENT;  Surgeon: Alexis Frock, MD;  Location: [facility];  Service: Urology;  Laterality: Bilateral;  65 MINS   CYSTOSCOPY WITH RETROGRADE PYELOGRAM, URETEROSCOPY AND STENT PLACEMENT Bilateral 01/06/2019   Procedure: CYSTOSCOPY WITH RETROGRADE PYELOGRAM,  URETEROSCOPY AND STENT PLACEMENT;  Surgeon: Alexis Frock, MD;  Location: [facility];  Service: Urology;  Laterality: Bilateral;  18 MINS   CYSTOSCOPY WITH RETROGRADE PYELOGRAM, URETEROSCOPY AND STENT PLACEMENT Bilateral 11/15/2020   Procedure: CYSTOSCOPY WITH RETROGRADE PYELOGRAM, LEFT DIAGNOSTIC URETEROSCOPY, RIGHT DIAGNOSTIC URETEROSCOPY WITH BIOPSY; RIGHT STENT PLACEMENT;  Surgeon: Alexis Frock, MD;  Location: [facility];  Service: Urology;  Laterality: Bilateral;  75 MINS   CYSTOSCOPY/URETEROSCOPY/HOLMIUM LASER/STENT PLACEMENT  2008;  2010;  2011;  2012   FLEXIBLE URETEROSCOPY Left 01/17/2013   Procedure: FLEXIBLE LEFT URETEROSCOPY; ATTEMPTED STONE BASKET EXTRACTION;  Surgeon: Marissa Nestle, MD;  Location: [facility];  Service: Urology;  Laterality: Left;   HOLMIUM LASER APPLICATION Bilateral [id number]   Procedure: HOLMIUM LASER APPLICATION;  Surgeon: Alexis Frock, MD;  [facility];  Service: Urology;  Laterality: Bilateral;   HOLMIUM LASER APPLICATION Bilateral [id number]   Procedure: HOLMIUM LASER APPLICATION;  Surgeon: Alexis Frock, MD;  Location: [facility];  Service: Urology;  Laterality: Bilateral;   LAPAROSCOPIC BILATERAL SALPINGECTOMY Bilateral 06/30/2021   Procedure: LAPAROSCOPIC BILATERAL SALPINGECTOMY; LYSIS OF ADHESIONS;  Surgeon: Carlyon Shadow, MD;  Location: [place];  Service: Gynecology;  Laterality: Bilateral;   LEFT HEART CATH AND  CORONARY ANGIOGRAPHY N/A 09/17/2021   Procedure: LEFT HEART CATH AND CORONARY ANGIOGRAPHY;  Surgeon: Corky Crafts, MD;  Location: [facility] [facility];  Service: Cardiovascular;  Laterality: N/A;   PERCUTANEOUS NEPHROSTOLITHOTOMY  06/15/2011   @[facility]     Current Medications: Current Meds  Medication Sig   aspirin EC 81 MG tablet Take 1 tablet (81 mg total) by mouth daily. Swallow whole.   atorvastatin (LIPITOR) 80 MG tablet Take 1 tablet  (80 mg total) by mouth daily.   empagliflozin (JARDIANCE) 10 MG TABS tablet Take 1 tablet (10 mg total) by mouth daily.   nitrofurantoin, macrocrystal-monohydrate, (MACROBID) 100 MG capsule Take 100 mg by mouth every 12 (twelve) hours.   varenicline (CHANTIX PAK) 0.5 MG X 11 & 1 MG X 42 tablet Take 1 tablet by mouth as directed.   carvedilol (COREG) 12.5 MG tablet Take 0.5 tablets (6.25 mg total) by mouth 2 (two) times daily with a meal. Incorrect rx. Patient is taking 2 tablets of 6.25mg  tablets to equal 12.5mg  BID as instructed per result note 09/23/21   losartan (COZAAR) 25 MG tablet Take 1 tablet (25 mg total) by mouth at bedtime.      Allergies:   Adhesive [tape]   Social History   Socioeconomic History   Marital status: Single    Spouse name: Not on file   Number of children: Not on file   Years of education: Not on file   Highest education level: Not on file  Occupational History   Not on file  Tobacco Use   Smoking status: Some Days    Types: Cigars   Smokeless tobacco: Never   Tobacco comments:    06-25-2021  per pt 6 per week, black mal small cigar's  Vaping Use   Vaping Use: Never used  Substance and Sexual Activity   Alcohol use: Yes    Comment: occasional   Drug use: Never   Sexual activity: Not on file  Other Topics Concern   Not on file  Social History Narrative   Not on file   Social Determinants of Health   Financial Resource Strain: Not on file  Food Insecurity: Not on file  Transportation Needs: Not on file  Physical Activity: Not on file  Stress: Not on file  Social Connections: Not on file     Family History:  The patient's family history includes Hypertension in her brother, father, and mother; Thyroid disease in her father. There is no history of CAD.  ROS:   Please see the history of present illness.  All other systems are reviewed and otherwise negative.    EKG(s)/Additional Labs   EKG:  EKG is not ordered today  Recent  Labs: 09/16/2021: Magnesium 2.0; TSH 2.063 09/17/2021: ALT 31; Hemoglobin 11.1; Platelets 271 09/22/2021: BUN 11; Creatinine, Ser 1.31; Potassium 4.7; Sodium 139  Recent Lipid Panel    Component Value Date/Time   CHOL 154 09/16/2021 0326   TRIG 181 (H) 09/16/2021 0326   HDL 22 (L) 09/16/2021 0326   CHOLHDL 7.0 09/16/2021 0326   VLDL 36 09/16/2021 0326   LDLCALC 96 09/16/2021 0326    PHYSICAL EXAM:    VS:  BP (!) 180/90   Pulse 96   Ht 5\' 3"  (1.6 m)   Wt 147 lb 12.8 oz (67 kg)   SpO2 97%   BMI 26.18 kg/m   BMI: Body mass index is 26.18 kg/m.  GEN: Well nourished, well developed female in no acute distress HEENT: normocephalic, atraumatic  Neck: no JVD, carotid bruits, or masses Cardiac: RRR; no murmurs, rubs, or gallops, no edema  Respiratory:  clear to auscultation bilaterally, normal work of breathing GI: soft, nontender, nondistended, + BS MS: no deformity or atrophy Skin: warm and dry, no rash Neuro:  Alert and Oriented x 3, Strength and sensation are intact, follows commands, nonfocal, normal gait Psych: euthymic mood, full affect  Wt Readings from Last 3 Encounters:  10/21/21 147 lb 12.8 oz (67 kg)  09/22/21 154 lb (69.9 kg)  09/18/21 154 lb 8 oz (70.1 kg)     ASSESSMENT & PLAN:   1. Uncontrolled HTN, sleep disordered breathing - BP 180/90 by nursing staff, repeated by me with same value. Needs better control. Will increase carvedilol from 12.5mg  BID (has been taking 2 6.25mg  tablets twice daily) to 25mg  BID and increase losartan from 25mg  to 50mg  daily with switch from PM to AM dosing. Her recent BMET was performed only a few days after discharge. Will repeat CMET today which will also allow [place] to see her albumin level. Given her young age and severe LVH with evidence for CKD I think secondary workup of HTN is indicated. Given headache, will check urine metanephrines and catecholamines. Hopeful this will improve with better BP control but will need close PCP f/u if it does  not. She did just see them last week as well. She reports prior observation of family that she has pauses in breathing during sleep therefore needs sleep study, will arrange Itamar sleep study. Will also pursue a renal artery duplex to exclude RAS. Will hold off renin/aldo level given that she is on losartan and we cannot presently stop this due to high BP - also had normal adrenals by recent imaging. I have asked her to begin checking her BP at home and to bring a log to next visit. Will arrange nurse BP check in 1 week with BMET and clinical follow-up with either me or Vin Bhagat in 4 weeks. Consideration would be given at f/u in 1 week to increasing losartan to 100mg  daily if f/u BMET stable. Cardiac MRI, as outlined below, will also help [place] understand how best to approach her BP as well. We also discussed avoiding pregnancy given that some of her medications are contraindicated in pregnancy - she is not actively trying to conceive and had bilateral salpingectomy in May.   HYPERTENSION CONTROL Vitals:   10/21/21 1523 10/21/21 1618  BP: (!) 180/90 (!) 180/90    The patient's blood pressure is elevated above target today.  In order to address the patient's elevated BP: A current anti-hypertensive medication was adjusted today.    2. Chronic HFpEF - appears euvolemic on exam today. Reports some ankle swelling but no pitting edema or major signs on exam. Discussed warning sx. Treat BP as above.  3. 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 No high risk symptoms or family history reported at this time.  4. CKD 3a - recheck labs today. Given recent AKI with residual lingering CKD stage 3, recommend referral to nephrology to establish care.  4. Mild anemia by labs - recheck with labs (required prior to cMRI per nursing protocol).  5. Mild CAD - started on ASA, statin during recent hospital  stay. Will get f/u CMET as above today but otherwise consider rechecking lipids at next OV given statin initiation last month.     Disposition: F/u  with me or Vin Bhagat to maintain continuity in 4 weeks.   Medication Adjustments/Labs and Tests Ordered: Current medicines are reviewed at length with the patient today.  Concerns regarding medicines are outlined above. Medication changes, Labs and Tests ordered today are summarized above and listed in the Patient Instructions accessible in Encounters.    Signed, Charlie Pitter, PA-C  10/21/2021 4:19 PM    [facility] in [place]. [place], [place] [address] Ph: [phone]; Fax [phone]

## 2021-10-21 ENCOUNTER — Ambulatory Visit: Payer: 59 | Attending: Physician Assistant | Admitting: Physician Assistant

## 2021-10-21 ENCOUNTER — Encounter: Payer: Self-pay | Admitting: Physician Assistant

## 2021-10-21 ENCOUNTER — Other Ambulatory Visit: Payer: Self-pay | Admitting: Physician Assistant

## 2021-10-21 VITALS — BP 180/90 | HR 96 | Ht 63.0 in | Wt 147.8 lb

## 2021-10-21 DIAGNOSIS — I10 Essential (primary) hypertension: Secondary | ICD-10-CM | POA: Diagnosis not present

## 2021-10-21 DIAGNOSIS — N1831 Chronic kidney disease, stage 3a: Secondary | ICD-10-CM

## 2021-10-21 DIAGNOSIS — G4730 Sleep apnea, unspecified: Secondary | ICD-10-CM

## 2021-10-21 DIAGNOSIS — I5032 Chronic diastolic (congestive) heart failure: Secondary | ICD-10-CM

## 2021-10-21 DIAGNOSIS — I517 Cardiomegaly: Secondary | ICD-10-CM | POA: Diagnosis not present

## 2021-10-21 DIAGNOSIS — D649 Anemia, unspecified: Secondary | ICD-10-CM

## 2021-10-21 DIAGNOSIS — I2510 Atherosclerotic heart disease of native coronary artery without angina pectoris: Secondary | ICD-10-CM

## 2021-10-21 MED ORDER — CARVEDILOL 25 MG PO TABS: 25.0000 mg | ORAL_TABLET | Freq: Two times a day (BID) | ORAL | 3 refills | Status: AC

## 2021-10-21 MED ORDER — LOSARTAN POTASSIUM 50 MG PO TABS: 50.0000 mg | ORAL_TABLET | Freq: Every day | ORAL | 3 refills | Status: DC

## 2021-10-21 NOTE — Patient Instructions (Addendum)
   Medication Instructions:  INCREASE Coreg to 25 mg twice a day   INCREASE Losartan to 50 mg every am  Labwork: As instructed  Testing/Procedures: Cardiac MRI  Renal US   Itamar Sleep study  Follow-Up: 1 week Nurse BP check with BMET   4 weeks with APP  Bhagat in [place]   Any Other Special Instructions Will Be Listed Below (If Applicable).    You have been referred to Nephrology    If you need a refill on your cardiac medications before your next appointment, please call your pharmacy.   You are scheduled for Cardiac MRI on ______________. Please arrive for your appointment at ______________ ( arrive 30-45 minutes prior to test start time). ?  [facility] [address] [place], [place] [address] [phone] Please take advantage of the free valet parking available at the MAIN entrance (A entrance).  Proceed to the [facility] Radiology Department (First Floor) for check-in.   OR   [facility] [address] [place], [place] [address] [phone] Please take advantage of the free valet parking available at the MAIN entrance. Proceed to [facility] registration for check-in (first floor).  Magnetic resonance imaging (MRI) is a painless test that produces images of the inside of the body without using Xrays.  During an MRI, strong magnets and radio waves work together in a Data processing manager to form detailed images.   MRI images may provide more details about a medical condition than X-rays, CT scans, and ultrasounds can provide.  You may be given earphones to listen for instructions.  You may eat a light breakfast and take medications as ordered with the exception of HCTZ (fluid pill, other). Please avoid stimulants for 12 hr prior to test. (Ie. Caffeine, nicotine, chocolate, or antihistamine medications)  If a contrast material will be used, an IV will be inserted into one of your veins. Contrast material will be  injected into your IV. It will leave your body through your urine within a day. You may be told to drink plenty of fluids to help flush the contrast material out of your system.  You will be asked to remove all metal, including: Watch, jewelry, and other metal objects including hearing aids, hair pieces and dentures. Also wearable glucose monitoring systems (ie. Freestyle Libre and Omnipods) (Braces and fillings normally are not a problem.)   TEST WILL TAKE APPROXIMATELY 1 HOUR  PLEASE NOTIFY SCHEDULING AT LEAST 24 HOURS IN ADVANCE IF YOU ARE UNABLE TO KEEP YOUR APPOINTMENT. [phone]  Please call Rockwell Alexandria, cardiac imaging nurse navigator with any questions/concerns. Rockwell Alexandria RN Navigator Cardiac Imaging Larey Brick RN Navigator Cardiac Imaging [facility] [phone] Office

## 2021-10-22 ENCOUNTER — Encounter: Payer: Self-pay | Admitting: Family Medicine

## 2021-10-23 ENCOUNTER — Encounter (HOSPITAL_BASED_OUTPATIENT_CLINIC_OR_DEPARTMENT_OTHER): Payer: 59 | Admitting: Cardiology

## 2021-10-23 ENCOUNTER — Ambulatory Visit: Payer: 59 | Attending: Physician Assistant

## 2021-10-23 ENCOUNTER — Telehealth: Payer: Self-pay

## 2021-10-23 ENCOUNTER — Telehealth: Payer: Self-pay | Admitting: *Deleted

## 2021-10-23 DIAGNOSIS — R0683 Snoring: Secondary | ICD-10-CM | POA: Diagnosis not present

## 2021-10-23 DIAGNOSIS — I10 Essential (primary) hypertension: Secondary | ICD-10-CM

## 2021-10-23 DIAGNOSIS — N1831 Chronic kidney disease, stage 3a: Secondary | ICD-10-CM

## 2021-10-23 DIAGNOSIS — I771 Stricture of artery: Secondary | ICD-10-CM

## 2021-10-23 NOTE — Telephone Encounter (Signed)
Prior Authorization for [facility] sent to [employer] via Phone. Reference # NO PA REQ -REF# [id number].

## 2021-10-23 NOTE — Telephone Encounter (Signed)
Patient notified and verbalized understanding, Patient had no questions or concerns at this time. PCP copied/Urology Copied. [facility] referral entered per provider.

## 2021-10-23 NOTE — Telephone Encounter (Signed)
-----   Message from Laurann Montana, [place] sent at 10/23/2021 12:58 PM EDT ----- This is a preliminary report, but let patient know there is no evidence of significant blockage in kidney arteries. There is hydronephrosis noted, or fluid buildup noted within the kidney. The report does not really quantify how much but this was also seen on her CT in July so I recommend to please let her urologist Dr. Berneice Heinrich know that her recent scans have shown hydronephrosis to see if they would like to see her.   Her scan did incidentally pick up some narrowing of the arteries within her stomach arteries. This can cause symptoms of abdominal pain after eating. This is not an emergent finding but I would suggest to go ahead and refer to vascular surgery for further evaluation.

## 2021-10-23 NOTE — Telephone Encounter (Signed)
Patient asked that I leave password [id number] on her cell phone

## 2021-10-28 ENCOUNTER — Telehealth: Payer: Self-pay | Admitting: *Deleted

## 2021-10-28 ENCOUNTER — Other Ambulatory Visit
Admission: RE | Admit: 2021-10-28 | Discharge: 2021-10-28 | Disposition: A | Discharge status: Home / Self Care | Payer: 59 | Source: Ambulatory Visit | Attending: Cardiology | Admitting: Cardiology

## 2021-10-28 ENCOUNTER — Ambulatory Visit: Payer: 59

## 2021-10-28 VITALS — BP 166/102 | HR 88 | Ht 63.0 in | Wt 147.0 lb

## 2021-10-28 DIAGNOSIS — G4730 Sleep apnea, unspecified: Secondary | ICD-10-CM | POA: Diagnosis present

## 2021-10-28 DIAGNOSIS — I10 Essential (primary) hypertension: Secondary | ICD-10-CM

## 2021-10-28 DIAGNOSIS — I517 Cardiomegaly: Secondary | ICD-10-CM | POA: Insufficient documentation

## 2021-10-28 DIAGNOSIS — D649 Anemia, unspecified: Secondary | ICD-10-CM | POA: Insufficient documentation

## 2021-10-28 DIAGNOSIS — N1831 Chronic kidney disease, stage 3a: Secondary | ICD-10-CM | POA: Insufficient documentation

## 2021-10-28 DIAGNOSIS — I5032 Chronic diastolic (congestive) heart failure: Secondary | ICD-10-CM | POA: Diagnosis present

## 2021-10-28 DIAGNOSIS — I2510 Atherosclerotic heart disease of native coronary artery without angina pectoris: Secondary | ICD-10-CM | POA: Insufficient documentation

## 2021-10-28 LAB — COMPREHENSIVE METABOLIC PANEL
ALT: 50 U/L — ABNORMAL HIGH (ref 0–44)
AST: 19 U/L (ref 15–41)
Albumin: 3.6 g/dL (ref 3.5–5.0)
Alkaline Phosphatase: 129 U/L — ABNORMAL HIGH (ref 38–126)
Anion gap: 6 (ref 5–15)
BUN: 14 mg/dL (ref 6–20)
CO2: 20 mmol/L — ABNORMAL LOW (ref 22–32)
Calcium: 8.9 mg/dL (ref 8.9–10.3)
Chloride: 114 mmol/L — ABNORMAL HIGH (ref 98–111)
Creatinine, Ser: 1.33 mg/dL — ABNORMAL HIGH (ref 0.44–1.00)
GFR, Estimated: 52 mL/min — ABNORMAL LOW (ref >60)
Glucose, Bld: 88 mg/dL (ref 70–99)
Potassium: 3.5 mmol/L (ref 3.5–5.1)
Sodium: 140 mmol/L (ref 135–145)
Total Bilirubin: 0.4 mg/dL (ref 0.3–1.2)
Total Protein: 7.5 g/dL (ref 6.5–8.1)

## 2021-10-28 LAB — CBC
HCT: 38.7 % (ref 36.0–46.0)
Hemoglobin: 12.3 g/dL (ref 12.0–15.0)
MCH: 26.9 pg (ref 26.0–34.0)
MCHC: 31.8 g/dL (ref 30.0–36.0)
MCV: 84.5 fL (ref 80.0–100.0)
Platelets: 354 10*3/uL (ref 150–400)
RBC: 4.58 MIL/uL (ref 3.87–5.11)
RDW: 15.5 % (ref 11.5–15.5)
WBC: 12.9 10*3/uL — ABNORMAL HIGH (ref 4.0–10.5)
nRBC: 0 % (ref 0.0–0.2)

## 2021-10-28 NOTE — Telephone Encounter (Signed)
Error

## 2021-10-28 NOTE — Progress Notes (Signed)
Patient presents today for 1 wk bp check per Ronie Spies, PA-C. Pt had lab work drawn today before coming into the office.   Pt states that she has discomfort intermittently in the center of her chest. Pt states that the discomfort comes and goes and is not a steady pain. Pt rates pain as a 5 on a scale of 1-10. Pt denies SOB, N/V when chest discomfort occurs.   Pt states that she is unable to check bp at home d/t cuff not working. Pt states that she is unable to afford bp cuff at this time. I offered pt $25 gift card from social worker to buy bp cuff at [place] or on [place] and she accepted.   Pt sat quietly for 5 minutes before bp was checked. Pt's bp was 166/102. Pt stated that she is compliant with medications and takes them in the morning before leaving for work. Pt stated that she wanted to remind provider that she has a UTI and that may be what is causing her bp to be elevated. Will fwd [place] to provider for review.

## 2021-10-31 ENCOUNTER — Telehealth: Payer: Self-pay

## 2021-10-31 DIAGNOSIS — I10 Essential (primary) hypertension: Secondary | ICD-10-CM

## 2021-10-31 MED ORDER — LOSARTAN POTASSIUM 100 MG PO TABS: 100.0000 mg | ORAL_TABLET | Freq: Every day | ORAL | 3 refills | Status: DC

## 2021-10-31 MED ORDER — AMLODIPINE BESYLATE 5 MG PO TABS: 5.0000 mg | ORAL_TABLET | Freq: Every day | ORAL | 3 refills | Status: DC

## 2021-10-31 NOTE — Telephone Encounter (Signed)
Spoke to pt who verbalized understanding. Pt had completed U-Pep and was told that she would have to re-do test as there was the wrong type of chemical in the urine specimen jug. Pt agreeable to medication changes and agreeable to lab work and nurse visit on 09/25 at the [place] office. Pt also completed Itamar Sleep Study- will wait for results from Dr. Mayford Knife.

## 2021-10-31 NOTE — Telephone Encounter (Signed)
-----   Message from Laurann Montana, [place] sent at 10/29/2021  8:43 AM EDT ----- Nurse BP check reviewed. Labs abnormalities noted but relatively stable. Given her most recent cath was OK, chest pain felt less likely to be ischemic but difficult to fully eval without acutely being checked out so if this is new, needs to return to the hospital since she was not previously reporting this symptoms. If she opts not to go, continue to work on BP control with increasing losartan to 100mg  daily and adding amlodipine 5mg  daily. Will likely need 10mg  daily but would start lower given possible hypertrophic cardiomyopathy and go from there. LFTs, white count mildly abnormal as well. Recheck nurse BP check 7-10 days with repeat CMET. Bring log of BPs to appt. Given her echo findings and recent admission, BP is likely long-standing and not necessarily related to UTI. Keep f/u in October as planned.

## 2021-11-02 NOTE — Procedures (Unsigned)
   Patient Information Study Date: 10/30/21 Patient Name: [name] Patient ID: [id number] Birth Date: October 22, 1980 Age: 41 Gender:  BMI: 26.2 (W=148 lb, H=5' 3'') Stopbang: 4 Referring Physician: Fransico Him, MD  TEST DESCRIPTION: Home sleep apnea testing was completed using the WatchPat, a Type 1 device, utilizing peripheral arterial tonometry (PAT), chest movement, actigraphy, pulse oximetry, pulse rate, body position and snore. AHI was calculated with apnea and hypopnea using valid sleep time as the denominator. RDI includes apneas, hypopneas, and RERAs. The data acquired and the scoring of sleep and all associated events were performed in accordance with the recommended standards and specifications as outlined in the AASM Manual for the Scoring of Sleep and Associated Events 2.2.0 (2015).   FINDINGS: 1.  No evidence of Obstructive Sleep Apnea with AHI 0.3/hr.  2.  No Central Sleep Apnea. 3.  Oxygen desaturations as low as 92%. 4.  Minimal snoring was present. O2 sats were < 88% for 0 minutes. 5.  Total sleep time was 7 hrs and 29 min. 6.  26.8% of total sleep time was spent in REM sleep.  7.  Shortened sleep onset latency at 5 min.  8.  Shortened REM sleep onset latency at 57 min.  9.  Total awakenings were 6.   DIAGNOSIS:  Normal study with no significant sleep disordered breathing.  RECOMMENDATIONS:   1. Normal study with no significant sleep disordered breathing.  2.  Healthy sleep recommendations include:  adequate nightly sleep (normal 7-9 hrs/night), avoidance of caffeine after noon and alcohol near bedtime, and maintaining a sleep environment that is cool, dark and quiet.  3.  Weight loss for overweight patients is recommended.    4.  Snoring recommendations include:  weight loss where appropriate, side sleeping, and avoidance of alcohol before bed.  5.  Operation of motor vehicle or dangerous equipment must be avoided when feeling drowsy, excessively sleepy, or  mentally fatigued.    6.  An ENT consultation which may be useful for specific causes of and possible treatment of bothersome snoring.   7. Weight loss may be of benefit in reducing the severity of snoring.   Signature: Fransico Him, MD; [facility]; [place], [place] Board of Sleep Medicine Electronically Signed: 11/02/21

## 2021-11-03 ENCOUNTER — Ambulatory Visit: Payer: 59 | Attending: Physician Assistant

## 2021-11-03 ENCOUNTER — Telehealth: Payer: Self-pay | Admitting: Physician Assistant

## 2021-11-03 DIAGNOSIS — I517 Cardiomegaly: Secondary | ICD-10-CM

## 2021-11-03 DIAGNOSIS — D649 Anemia, unspecified: Secondary | ICD-10-CM

## 2021-11-03 DIAGNOSIS — N1831 Chronic kidney disease, stage 3a: Secondary | ICD-10-CM

## 2021-11-03 DIAGNOSIS — I2510 Atherosclerotic heart disease of native coronary artery without angina pectoris: Secondary | ICD-10-CM

## 2021-11-03 DIAGNOSIS — G4730 Sleep apnea, unspecified: Secondary | ICD-10-CM

## 2021-11-03 DIAGNOSIS — I10 Essential (primary) hypertension: Secondary | ICD-10-CM

## 2021-11-03 DIAGNOSIS — I5032 Chronic diastolic (congestive) heart failure: Secondary | ICD-10-CM

## 2021-11-03 MED ORDER — EMPAGLIFLOZIN 10 MG PO TABS: 10.0000 mg | ORAL_TABLET | Freq: Every day | ORAL | 3 refills | Status: AC

## 2021-11-03 MED ORDER — ASPIRIN 81 MG PO TBEC: 81.0000 mg | DELAYED_RELEASE_TABLET | Freq: Every day | ORAL | 12 refills | Status: AC

## 2021-11-03 NOTE — Telephone Encounter (Signed)
Pt c/o medication issue:  1. Name of Medication: atorvastatin (LIPITOR) 80 MG tablet aspirin EC 81 MG tablet empagliflozin (JARDIANCE) 10 MG TABS tablet   2. How are you currently taking this medication (dosage and times per day)? As prescribed   3. Are you having a reaction (difficulty breathing--STAT)? No  4. What is your medication issue?  Pt needs this sent to a different pharmacy today. [place], [place]  Pt states she is out of this medication.

## 2021-11-03 NOTE — Telephone Encounter (Signed)
Completed.

## 2021-11-05 ENCOUNTER — Encounter: Payer: Self-pay | Admitting: Vascular Surgery

## 2021-11-05 ENCOUNTER — Ambulatory Visit (INDEPENDENT_AMBULATORY_CARE_PROVIDER_SITE_OTHER): Payer: 59 | Admitting: Vascular Surgery

## 2021-11-05 VITALS — BP 182/93 | HR 70 | Temp 98.4°F | Resp 18 | Ht 63.0 in | Wt 141.2 lb

## 2021-11-05 DIAGNOSIS — K551 Chronic vascular disorders of intestine: Secondary | ICD-10-CM

## 2021-11-05 NOTE — Progress Notes (Signed)
Vascular and Vein Specialist of [place]  Patient name: [name] MRN: [id number] DOB: [birth year] Sex: female  REASON FOR CONSULT: Evaluation recent ultrasound suggesting celiac and SMA stenosis  HPI: [name] is a 41 y.o. female, who is here today for evaluation.  She has a complicated past history.  Has history of chronic congestive heart failure also history of atrophy of her left kidney related to multiple prior renal stones.  She does have hypertension and recently underwent renal duplex for evaluation of this.  Her duplex did show some hydronephrosis but no evidence of significant renal artery stenosis.  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  The patient reports that she is having weight loss and a difficult time with eating.  She has not specifically having postprandial pain but is learned that she can only have small snacks and does not have any desire to eat anymore.  Has had no change in her bowel habit.  She has no appetite.  Past Medical History:  Diagnosis Date   Anxiety    Atrophy of left kidney    Chronic heart failure with preserved ejection fraction (HFpEF) (HCC)    Chronic kidney disease, stage 3a (HCC)    Depression    History of kidney stones    Hydrosalpinx    Hypertension    followed by pcp   Mild CAD    Nephrolithiasis    per CT 05-06-2021  right renal stone nonostructive   Pain, female pelvic    Urgency of urination     Family History  Problem Relation Age of Onset   Hypertension Mother    Hypertension Father    Thyroid disease Father    Hypertension Brother    CAD Neg Hx     SOCIAL HISTORY: Social History   Socioeconomic History   Marital status: Single    Spouse name: Not on file   Number of children: Not on file   Years of education: Not on file   Highest education level: Not on file  Occupational History   Not on file  Tobacco Use    Smoking status: Some Days    Types: Cigars   Smokeless tobacco: Never   Tobacco comments:    06-25-2021  per pt 6 per week, black mal small cigar's  Vaping Use   Vaping Use: Never used  Substance and Sexual Activity   Alcohol use: Yes    Comment: occasional   Drug use: Never   Sexual activity: Not on file  Other Topics Concern   Not on file  Social History Narrative   Not on file   Social Determinants of Health   Financial Resource Strain: Not on file  Food Insecurity: Not on file  Transportation Needs: Not on file  Physical Activity: Not on file  Stress: Not on file  Social Connections: Not on file  Intimate Partner Violence: Not on file    Allergies  Allergen Reactions   Adhesive [Tape] Rash    Current Outpatient Medications  Medication Sig Dispense Refill   amLODipine (NORVASC) 5 MG tablet Take 1 tablet (5 mg total) by mouth daily. 90 tablet 3   aspirin EC 81 MG tablet Take 1 tablet (81 mg total) by mouth daily. Swallow whole. 30 tablet 12   atorvastatin (LIPITOR) 80 MG tablet Take 1 tablet (80 mg total) by mouth daily. 30 tablet 3   carvedilol (COREG) 25 MG tablet Take 1 tablet (  25 mg total) by mouth 2 (two) times daily. 180 tablet 3   empagliflozin (JARDIANCE) 10 MG TABS tablet Take 1 tablet (10 mg total) by mouth daily. 30 tablet 3   losartan (COZAAR) 100 MG tablet Take 1 tablet (100 mg total) by mouth daily. 90 tablet 3   varenicline (CHANTIX PAK) 0.5 MG X 11 & 1 MG X 42 tablet Take 1 tablet by mouth as directed.     No current facility-administered medications for this visit.    REVIEW OF SYSTEMS:  [X]  denotes positive finding, [ ]  denotes negative finding Cardiac  Comments:  Chest pain or chest pressure: x   Shortness of breath upon exertion:    Short of breath when lying flat:    Irregular heart rhythm:        Vascular    Pain in calf, thigh, or hip brought on by ambulation:    Pain in feet at night that wakes you up from your sleep:     Blood clot  in your veins:    Leg swelling:         Pulmonary    Oxygen at home:    Productive cough:     Wheezing:         Neurologic    Sudden weakness in arms or legs:     Sudden numbness in arms or legs:     Sudden onset of difficulty speaking or slurred speech:    Temporary loss of vision in one eye:     Problems with dizziness:         Gastrointestinal    Blood in stool:     Vomited blood:         Genitourinary    Burning when urinating:     Blood in urine:        Psychiatric    Major depression:  x       Hematologic    Bleeding problems:    Problems with blood clotting too easily:        Skin    Rashes or ulcers:        Constitutional    Fever or chills:      PHYSICAL EXAM: Vitals:   11/05/21 1018  BP: (!) 182/93  Pulse: 70  Resp: 18  Temp: 98.4 F (36.9 C)  TempSrc: Temporal  SpO2: 98%  Weight: 141 lb 3.2 oz (64 kg)  Height: 5\' 3"  (1.6 m)    GENERAL: The patient is a well-nourished female, in no acute distress. The vital signs are documented above. CARDIOVASCULAR: Plus radial pulses bilaterally.  2+ dorsalis pedis pulses bilaterally.  She does not have an abdominal bruit.  No abdominal tenderness PULMONARY: There is good air exchange  MUSCULOSKELETAL: There are no major deformities or cyanosis. NEUROLOGIC: No focal weakness or paresthesias are detected. SKIN: There are no ulcers or rashes noted. PSYCHIATRIC: The patient has a normal affect.  DATA:  I reviewed her abdominal duplex with markedly elevated velocities in the celiac and superior mesenteric artery.  She had a noncontrast CT scan of her abdomen for evaluation of renal stones in March 2023.  She has no evidence of significant calcification in this noncontrast study.  She does not appear to have any compression of her celiac or superior mesenteric artery.  MEDICAL ISSUES: Had a long discussion with the patient regarding her possible mesenteric stenosis based on duplex.  I did explain that this is  somewhat of a technically difficult study sometimes.  If she was not having any abdominal complaints, I would recommend observation only.  With her postprandial difficulty, I would recommend CT angiogram for further evaluation.  She does have mild renal insufficiency with a creatinine of 1.3.  We will proceed with CT angiogram and I will see her back in the office following this for further discussion   Rosetta Posner, MD [facility] [place] [phone] Pager [phone]  Note: Portions of this report may have been transcribed using voice recognition software.  Every effort has been made to ensure accuracy; however, inadvertent computerized transcription errors may still be present.

## 2021-11-07 ENCOUNTER — Telehealth: Payer: Self-pay | Admitting: *Deleted

## 2021-11-07 NOTE — Telephone Encounter (Signed)
The patient has been notified of the result. Left detailed message on voicemail and informed patient to call back .Marolyn Hammock, CMA .  Sleep study showed no significant sleep apnea.

## 2021-11-07 NOTE — Telephone Encounter (Signed)
-----   Message from Lauralee Evener, [place] sent at 11/03/2021  8:18 AM EDT -----  ----- Message ----- From: Sueanne Margarita, MD Sent: 11/02/2021   7:27 PM EDT To: Cv Div Sleep Studies  Please let patient know that sleep study showed no significant sleep apnea.

## 2021-11-10 ENCOUNTER — Telehealth: Payer: Self-pay | Admitting: Physician Assistant

## 2021-11-10 ENCOUNTER — Ambulatory Visit: Payer: 59 | Attending: Cardiology

## 2021-11-10 VITALS — BP 146/90 | HR 83 | Ht 63.0 in | Wt 141.0 lb

## 2021-11-10 DIAGNOSIS — I10 Essential (primary) hypertension: Secondary | ICD-10-CM | POA: Diagnosis not present

## 2021-11-10 MED ORDER — ATORVASTATIN CALCIUM 80 MG PO TABS: 80.0000 mg | ORAL_TABLET | Freq: Every day | ORAL | 3 refills | Status: DC

## 2021-11-10 NOTE — Progress Notes (Signed)
Patient presents today for Nurse Visit- BP check per D. Dunn, PA-C.  Pt denies cp, sob, n/v.  Will fwd to provider for review.

## 2021-11-10 NOTE — Telephone Encounter (Signed)
Nurse BP check reviewed. Thank you for helping with this! BP slowly improving. At last phone note 9/15, losartan increased to 100mg  daily + added amlodipine 5mg  daily.  Pt was to get f/u CMET today - can we check to make sure she went to go get this done? Will await labs before adjusting meds further.  Also want to find out if she brought log of BPs to visit.  Keep OV 10/3 as scheduled.

## 2021-11-11 NOTE — Telephone Encounter (Signed)
Patient notified and will have labs completed.

## 2021-11-11 NOTE — Telephone Encounter (Signed)
Pt did not have f/u labs completed and did not bring bp readings with her to nurse visit.

## 2021-11-13 ENCOUNTER — Other Ambulatory Visit: Payer: Self-pay

## 2021-11-13 DIAGNOSIS — K551 Chronic vascular disorders of intestine: Secondary | ICD-10-CM

## 2021-11-18 ENCOUNTER — Ambulatory Visit: Payer: 59 | Attending: Physician Assistant | Admitting: Physician Assistant

## 2021-11-18 ENCOUNTER — Encounter: Payer: Self-pay | Admitting: Physician Assistant

## 2021-11-18 VITALS — BP 116/70 | HR 71 | Ht 63.0 in | Wt 144.0 lb

## 2021-11-18 DIAGNOSIS — I517 Cardiomegaly: Secondary | ICD-10-CM | POA: Diagnosis not present

## 2021-11-18 DIAGNOSIS — N1831 Chronic kidney disease, stage 3a: Secondary | ICD-10-CM | POA: Diagnosis not present

## 2021-11-18 DIAGNOSIS — I5032 Chronic diastolic (congestive) heart failure: Secondary | ICD-10-CM

## 2021-11-18 DIAGNOSIS — K551 Chronic vascular disorders of intestine: Secondary | ICD-10-CM

## 2021-11-18 DIAGNOSIS — I10 Essential (primary) hypertension: Secondary | ICD-10-CM | POA: Diagnosis not present

## 2021-11-18 NOTE — Patient Instructions (Signed)
Medication Instructions:  Your physician recommends that you continue on your current medications as directed. Please refer to the Current Medication list given to you today. *If you need a refill on your cardiac medications before your next appointment, please call your pharmacy*   Lab Work: None Ordered   Testing/Procedures: None Ordered   Follow-Up: At [facility], you and your health needs are our priority.  As part of our continuing mission to provide you with exceptional heart care, we have created designated Provider Care Teams.  These Care Teams include your primary Cardiologist (physician) and Advanced Practice Providers (APPs -  Physician Assistants and Nurse Practitioners) who all work together to provide you with the care you need, when you need it.  We recommend signing up for the patient portal called "MyChart".  Sign up information is provided on this After Visit Summary.  MyChart is used to connect with patients for Virtual Visits (Telemedicine).  Patients are able to view lab/test results, encounter notes, upcoming appointments, etc.  Non-urgent messages can be sent to your provider as well.   To learn more about what you can do with MyChart, go to [email or web address].    Your next appointment:   4 month(s) in [place]  The format for your next appointment:   In Person  Provider:   Melina Copa or New Doctor in [place] office     Other Instructions   Important Information About Sugar

## 2021-11-18 NOTE — Progress Notes (Signed)
Cardiology Office Note:    Date:  11/18/2021   ID:  [name], DOB 11-17-1980, MRN [id number]  PCP:  [facility] Cardiologist:  Lauree Chandler, MD  [facility] Electrophysiologist:  None   Chief Complaint: 4 weeks follow up  History of Present Illness:    [name] is a 41 y.o. female with a hx of hypertension, LVH, anxiety/depression,CKD IIIA,  pyelonephritis, nephrolithiasis and tobacco abuse seen for follow up.  Admitted 09/2021 with NSTEMI. Cath showed mild CAD with elevated LVEDP. Felt ACS likely due to LVH/HTN and diastolic dysfunction. Added jardiance and losartan. Plan to consider cMRI as outpatient.   Seen for follow-up and antihypertensive adjusted multiple times.  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  Patient was seen by Dr. Donnetta Hutching with plan to perform CT angio of abdominal pelvis.  Its been pending.  Patient is here for follow-up.  Blood pressure accidentally controlled today at 116/70.  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  Few days ago had some chest discomfort which was reproducible with palpation at that time.  She denies shortness of breath, orthopnea, PND, syncope, lower extremity edema or melena.  Past Medical History:  Diagnosis Date   Anxiety    Atrophy of left kidney    Chronic heart failure with preserved ejection fraction (HFpEF) (HCC)    Chronic kidney disease, stage 3a (HCC)    Depression    History of kidney stones    Hydrosalpinx    Hypertension    followed by pcp   Mild CAD    Nephrolithiasis    per CT 05-06-2021  right renal stone nonostructive   Pain, female pelvic    Urgency of urination     Past Surgical History:  Procedure Laterality Date   APPENDECTOMY  age 18   ARTHOSCOPIC 76 CUFF REPAIR Right 10/31/2019   Procedure:  ARTHROSCOPIC ROTATOR CUFF REPAIR;  Surgeon: Carole Civil, MD;  Location: [facility];  Service: Orthopedics;  Laterality: Right;   BALLOON DILATION Left 01/17/2013   Procedure: BALLOON DILATION OF LEFT URETERAL STRICTURE;  Surgeon: Marissa Nestle, MD;  Location: [facility];  Service: Urology;  Laterality: Left;   CYSTOSCOPY W/ URETERAL STENT PLACEMENT Left 01/17/2013   Procedure: CYSTOSCOPY WITH LEFT RETROGRADE PYELOGRAM; LEFT URETERAL STENT PLACEMENT;  Surgeon: Marissa Nestle, MD;  Location: [facility];  Service: Urology;  Laterality: Left;   CYSTOSCOPY WITH RETROGRADE PYELOGRAM, URETEROSCOPY AND STENT PLACEMENT Bilateral 12/23/2018   Procedure: CYSTOSCOPY WITH RETROGRADE PYELOGRAM, URETEROSCOPY AND STENT PLACEMENT;  Surgeon: Alexis Frock, MD;  Location[facility];  Service: Urology;  Laterality: Bilateral;  52 MINS   CYSTOSCOPY WITH RETROGRADE PYELOGRAM, URETEROSCOPY AND STENT PLACEMENT Bilateral 01/06/2019   Procedure: CYSTOSCOPY WITH RETROGRADE PYELOGRAM, URETEROSCOPY AND STENT PLACEMENT;  Surgeon: Alexis Frock, MD;  Location: [facility];  Service: Urology;  Laterality: Bilateral;  35 MINS   CYSTOSCOPY WITH RETROGRADE PYELOGRAM, URETEROSCOPY AND STENT PLACEMENT Bilateral 11/15/2020   Procedure: CYSTOSCOPY WITH RETROGRADE PYELOGRAM, LEFT DIAGNOSTIC URETEROSCOPY, RIGHT DIAGNOSTIC URETEROSCOPY WITH BIOPSY; RIGHT STENT PLACEMENT;  Surgeon: Alexis Frock, MD;  Location: [facility];  Service: Urology;  Laterality: Bilateral;  75 MINS   CYSTOSCOPY/URETEROSCOPY/HOLMIUM LASER/STENT PLACEMENT  2008;  2010;  2011;  2012   FLEXIBLE URETEROSCOPY Left 01/17/2013   Procedure: FLEXIBLE LEFT URETEROSCOPY; ATTEMPTED STONE BASKET EXTRACTION;  Surgeon:  Marissa Nestle, MD;  Location: [facility];  Service: Urology;  Laterality: Left;   HOLMIUM LASER APPLICATION Bilateral [id number]   Procedure: HOLMIUM LASER APPLICATION;  Surgeon: Alexis Frock, MD;  Location:  [facility];  Service: Urology;  Laterality: Bilateral;   HOLMIUM LASER APPLICATION Bilateral [id number]   Procedure: HOLMIUM LASER APPLICATION;  Surgeon: Alexis Frock, MD;  Location: [facility];  Service: Urology;  Laterality: Bilateral;   LAPAROSCOPIC BILATERAL SALPINGECTOMY Bilateral 06/30/2021   Procedure: LAPAROSCOPIC BILATERAL SALPINGECTOMY; LYSIS OF ADHESIONS;  Surgeon: Carlyon Shadow, MD;  Location: [facility];  Service: Gynecology;  Laterality: Bilateral;   LEFT HEART CATH AND CORONARY ANGIOGRAPHY N/A 09/17/2021   Procedure: LEFT HEART CATH AND CORONARY ANGIOGRAPHY;  Surgeon: Jettie Booze, MD;  Location: [facility];  Service: Cardiovascular;  Laterality: N/A;   PERCUTANEOUS NEPHROSTOLITHOTOMY  06/15/2011   @[facility]     Current Medications: Current Meds  Medication Sig   amLODipine (NORVASC) 5 MG tablet Take 1 tablet (5 mg total) by mouth daily.   aspirin EC 81 MG tablet Take 1 tablet (81 mg total) by mouth daily. Swallow whole.   atorvastatin (LIPITOR) 80 MG tablet Take 1 tablet (80 mg total) by mouth daily.   carvedilol (COREG) 25 MG tablet Take 1 tablet (25 mg total) by mouth 2 (two) times daily.   empagliflozin (JARDIANCE) 10 MG TABS tablet Take 1 tablet (10 mg total) by mouth daily.   losartan (COZAAR) 100 MG tablet Take 1 tablet (100 mg total) by mouth daily.   varenicline (CHANTIX PAK) 0.5 MG X 11 & 1 MG X 42 tablet Take 1 tablet by mouth as directed.     Allergies:   Adhesive [tape]   Social History   Socioeconomic History   Marital status: Single    Spouse name: Not on file   Number of children: Not on file   Years of education: Not on file   Highest education level: Not on file  Occupational History   Not on file  Tobacco Use   Smoking status: Some Days    Types: Cigars   Smokeless tobacco: Never   Tobacco comments:    06-25-2021  per pt 6 per week, black mal small cigar's  Vaping Use   Vaping  Use: Never used  Substance and Sexual Activity   Alcohol use: Yes    Comment: occasional   Drug use: Never   Sexual activity: Not on file  Other Topics Concern   Not on file  Social History Narrative   Not on file   Social Determinants of Health   Financial Resource Strain: Not on file  Food Insecurity: Not on file  Transportation Needs: Not on file  Physical Activity: Not on file  Stress: Not on file  Social Connections: Not on file     Family History: The patient's family history includes Hypertension in her brother, father, and mother; Thyroid disease in her father. There is no history of CAD.    ROS:   Please see the history of present illness.    All other systems reviewed and are negative.   EKGs/Labs/Other Studies Reviewed:    The following studies were reviewed today:  Renal doppler 10/23/21 Summary:  Largest Aortic Diameter: 1.9 cm     Renal:     Right: Normal size right kidney. Hydronephrosis Normal cortical         thickness of right kidney. Normal right Resisitive Index.  1-59% stenosis of the right renal artery. RRV flow present.  Left:  Abnormal size for the left kidney. Abnormal left Resisitve         Index. Abnormal cortical thickness of the left kidney. No         evidence of left renal artery stenosis. Cyst(s) noted.         Hydronephrosis LRV flow present.  Mesenteric:  70 to 99% stenosis in the celiac artery and superior mesenteric artery.  IVC is patent.   Cath 8/2: Mid LAD lesion is 20% stenosed.   The left ventricular systolic function is normal.   LV end diastolic pressure is moderately elevated.   The left ventricular ejection fraction is greater than 65% by visual estimate.   There is no aortic valve stenosis.   TTE 8/1: 1. Left ventricular ejection fraction, by estimation, is 65 to 70%. The  left ventricle has normal function. The left ventricle has no regional  wall motion abnormalities. There is severe concentric left  ventricular  hypertrophy, best seen in PSAX views.  Left ventricular diastolic parameters are consistent with Grade II  diastolic dysfunction (pseudonormalization). No evidence of left apical  aneurysm. 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   2. Right ventricular systolic function is normal. The right ventricular  size is normal. There is normal pulmonary artery systolic pressure.   3. The mitral valve is normal in structure. No evidence of mitral valve  regurgitation. No evidence of mitral stenosis.   4. The aortic valve was not well visualized. Aortic valve regurgitation  is not visualized. No aortic stenosis is present.   Conclusion(s)/Recommendation(s): If negative ischemic work up, cardiac MRI  may be considered to assess for etiology of hypertrophy.   EKG:  EKG is not  ordered today.   Recent Labs: 09/16/2021: Magnesium 2.0; TSH 2.063 10/28/2021: ALT 50; BUN 14; Creatinine, Ser 1.33; Hemoglobin 12.3; Platelets 354; Potassium 3.5; Sodium 140  Recent Lipid Panel    Component Value Date/Time   CHOL 154 09/16/2021 0326   TRIG 181 (H) 09/16/2021 0326   HDL 22 (L) 09/16/2021 0326   CHOLHDL 7.0 09/16/2021 0326   VLDL 36 09/16/2021 0326   LDLCALC 96 09/16/2021 0326    Physical Exam:    VS:  BP 116/70   Pulse 71   Ht 5\' 3"  (1.6 m)   Wt 144 lb (65.3 kg)   SpO2 97%   BMI 25.51 kg/m     Wt Readings from Last 3 Encounters:  11/18/21 144 lb (65.3 kg)  11/10/21 141 lb (64 kg)  11/05/21 141 lb 3.2 oz (64 kg)     GEN:  Well nourished, well developed in no acute distress HEENT: Normal NECK: No JVD; No carotid bruits LYMPHATICS: No lymphadenopathy CARDIAC: RRR, no murmurs, rubs, gallops RESPIRATORY:  Clear to auscultation without rales, wheezing or rhonchi  ABDOMEN: Soft, non-tender, non-distended MUSCULOSKELETAL:  No edema; No deformity  SKIN: Warm and dry NEUROLOGIC:  Alert and oriented x 3 PSYCHIATRIC:  Normal affect   ASSESSMENT AND PLAN:     Diastolic dysfunction/LVH No heart failure symptoms.  Appears euvolemic.  Has pending cardiac MRI next month.  2.  Hypertension Blood pressure is excellently controlled on amlodipine 5 mg daily, carvedilol 25 mg twice daily and losartan 100 mg daily.  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  Followed by Dr. Donnetta Hutching.   4. CKD IIIa - Scr stable   Medication Adjustments/Labs and Tests Ordered: Current medicines are  reviewed at length with the patient today.  Concerns regarding medicines are outlined above.  No orders of the defined types were placed in this encounter.  No orders of the defined types were placed in this encounter.   Patient Instructions  Medication Instructions:  Your physician recommends that you continue on your current medications as directed. Please refer to the Current Medication list given to you today. *If you need a refill on your cardiac medications before your next appointment, please call your pharmacy*   Lab Work: None Ordered   Testing/Procedures: None Ordered   Follow-Up: At [facility], you and your health needs are our priority.  As part of our continuing mission to provide you with exceptional heart care, we have created designated Provider Care Teams.  These Care Teams include your primary Cardiologist (physician) and Advanced Practice Providers (APPs -  Physician Assistants and Nurse Practitioners) who all work together to provide you with the care you need, when you need it.  We recommend signing up for the patient portal called "MyChart".  Sign up information is provided on this After Visit Summary.  MyChart is used to connect with patients for Virtual Visits (Telemedicine).  Patients are able to view lab/test results, encounter notes, upcoming appointments, etc.  Non-urgent messages can be sent to your provider as well.   To learn more about what you can do with MyChart, go to [email or web address].    Your next  appointment:   4 month(s) in [place]  The format for your next appointment:   In Person  Provider:   Melina Copa or New Doctor in [place] office     Other Instructions   Important Information About Sugar         Jarrett Soho, [place]  11/18/2021 4:13 PM    [place]

## 2021-11-19 ENCOUNTER — Other Ambulatory Visit (HOSPITAL_COMMUNITY)
Admission: RE | Admit: 2021-11-19 | Discharge: 2021-11-19 | Disposition: A | Discharge status: Home / Self Care | Payer: 59 | Source: Ambulatory Visit | Attending: Physician Assistant | Admitting: Physician Assistant

## 2021-11-19 DIAGNOSIS — I517 Cardiomegaly: Secondary | ICD-10-CM

## 2021-11-19 DIAGNOSIS — D649 Anemia, unspecified: Secondary | ICD-10-CM

## 2021-11-19 DIAGNOSIS — I5032 Chronic diastolic (congestive) heart failure: Secondary | ICD-10-CM

## 2021-11-19 DIAGNOSIS — G4730 Sleep apnea, unspecified: Secondary | ICD-10-CM

## 2021-11-19 DIAGNOSIS — I2510 Atherosclerotic heart disease of native coronary artery without angina pectoris: Secondary | ICD-10-CM

## 2021-11-19 DIAGNOSIS — N1831 Chronic kidney disease, stage 3a: Secondary | ICD-10-CM

## 2021-11-19 DIAGNOSIS — I10 Essential (primary) hypertension: Secondary | ICD-10-CM | POA: Insufficient documentation

## 2021-11-19 LAB — COMPREHENSIVE METABOLIC PANEL
ALT: 19 U/L (ref 0–44)
AST: 17 U/L (ref 15–41)
Albumin: 3.7 g/dL (ref 3.5–5.0)
Alkaline Phosphatase: 89 U/L (ref 38–126)
Anion gap: 5 (ref 5–15)
BUN: 19 mg/dL (ref 6–20)
CO2: 23 mmol/L (ref 22–32)
Calcium: 9.3 mg/dL (ref 8.9–10.3)
Chloride: 112 mmol/L — ABNORMAL HIGH (ref 98–111)
Creatinine, Ser: 1.21 mg/dL — ABNORMAL HIGH (ref 0.44–1.00)
GFR, Estimated: 58 mL/min — ABNORMAL LOW (ref >60)
Glucose, Bld: 84 mg/dL (ref 70–99)
Potassium: 3.8 mmol/L (ref 3.5–5.1)
Sodium: 140 mmol/L (ref 135–145)
Total Bilirubin: 0.6 mg/dL (ref 0.3–1.2)
Total Protein: 7.3 g/dL (ref 6.5–8.1)

## 2021-11-24 LAB — CATECHOLAMINES,UR.,FREE,24 HR
Dopamine, Rand Ur: 180 ug/L
Dopamine, Ur, 24Hr: 216 ug/24 hr (ref 0–510)
Epinephrine, Rand Ur: 4 ug/L
Epinephrine, U, 24Hr: 5 ug/24 hr (ref 0–20)
Norepinephrine, Rand Ur: 28 ug/L
Norepinephrine,U,24H: 34 ug/24 hr (ref 0–135)
Total Volume: 1200

## 2021-12-03 ENCOUNTER — Encounter: Payer: Self-pay | Admitting: Vascular Surgery

## 2021-12-03 ENCOUNTER — Ambulatory Visit (INDEPENDENT_AMBULATORY_CARE_PROVIDER_SITE_OTHER): Payer: 59 | Admitting: Vascular Surgery

## 2021-12-03 VITALS — BP 170/101 | HR 71 | Temp 98.1°F | Ht 63.0 in | Wt 149.0 lb

## 2021-12-03 DIAGNOSIS — R93429 Abnormal radiologic findings on diagnostic imaging of unspecified kidney: Secondary | ICD-10-CM

## 2021-12-03 NOTE — Progress Notes (Signed)
[facility] [place]  Patient name: [name] MRN: [id number] DOB: 07-17-1980 Sex: female  REASON FOR VISIT: Follow-up CT scan  HPI: [name] is a 41 y.o. female here today for follow-up.  I saw her in our office on 11/05/2021.  She had undergone a renal duplex to rule out renovascular disease as a cause for her hypertension and renal insufficiency.  She has a history of multiple renal stones and nonfunctional left kidney.  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  She underwent CT scan for further evaluation of this and is here today for discussion. Past Medical History:  Diagnosis Date   Anxiety    Atrophy of left kidney    Chronic heart failure with preserved ejection fraction (HFpEF) (HCC)    Chronic kidney disease, stage 3a (HCC)    Depression    History of kidney stones    Hydrosalpinx    Hypertension    followed by pcp   Mild CAD    Nephrolithiasis    per CT 05-06-2021  right renal stone nonostructive   Pain, female pelvic    Urgency of urination     Family History  Problem Relation Age of Onset   Hypertension Mother    Hypertension Father    Thyroid disease Father    Hypertension Brother    CAD Neg Hx     SOCIAL HISTORY: Social History   Tobacco Use   Smoking status: Some Days    Types: Cigars   Smokeless tobacco: Never   Tobacco comments:    06-25-2021  per pt 6 per week, black mal small cigar's  Substance Use Topics   Alcohol use: Yes    Comment: occasional    Allergies  Allergen Reactions   Adhesive [Tape] Rash    Current Outpatient Medications  Medication Sig Dispense Refill   amLODipine (NORVASC) 5 MG tablet Take 1 tablet (5 mg total) by mouth daily. 90 tablet 3   aspirin EC 81 MG tablet Take 1 tablet (81 mg total) by mouth daily. Swallow whole. 30 tablet 12   atorvastatin (LIPITOR) 80 MG tablet Take 1 tablet (80 mg total) by  mouth daily. 30 tablet 3   carvedilol (COREG) 25 MG tablet Take 1 tablet (25 mg total) by mouth 2 (two) times daily. 180 tablet 3   empagliflozin (JARDIANCE) 10 MG TABS tablet Take 1 tablet (10 mg total) by mouth daily. 30 tablet 3   losartan (COZAAR) 100 MG tablet Take 1 tablet (100 mg total) by mouth daily. 90 tablet 3   varenicline (CHANTIX PAK) 0.5 MG X 11 & 1 MG X 42 tablet Take 1 tablet by mouth as directed.     No current facility-administered medications for this visit.    PHYSICAL EXAM: Vitals:   12/03/21 1027  BP: (!) 170/101  Pulse: 71  Temp: 98.1 F (36.7 C)  SpO2: 98%  Weight: 149 lb (67.6 kg)  Height: 5\' 3"  (1.6 m)    GENERAL: The patient is a well-nourished female, in no acute distress. The vital signs are documented above.  DATA:  I reviewed her CT images and her CT report with the patient.  This shows widely patent celiac, SMA and IMA arteries.  She does have a small left kidney as expected and does have some hydronephrosis of her right kidney.  MEDICAL ISSUES: I discussed this with the patient explaining that her duplex was incorrect in its  prediction of high-grade mesenteric stenosis.  Would not recommend any further evaluation or follow-up.    Rosetta Posner, MD FACS [facility] [phone]  Note: Portions of this report may have been transcribed using voice recognition software.  Every effort has been made to ensure accuracy; however, inadvertent computerized transcription errors may still be present.

## 2021-12-10 LAB — METANEPHRINES, URINE, 24 HOUR
Metaneph Total, Ur: 140 ug/L
Metanephrines, 24H Ur: 168 ug/24 hr (ref 36–209)
Normetanephrine, 24H Ur: 336 ug/24 hr (ref 131–612)
Normetanephrine, Ur: 280 ug/L
Total Volume: 1200

## 2022-01-07 ENCOUNTER — Telehealth (HOSPITAL_COMMUNITY): Payer: Self-pay | Admitting: *Deleted

## 2022-01-07 NOTE — Telephone Encounter (Signed)
Attempted to call patient regarding upcoming cardiac MRI appointment. Left message on voicemail with name and callback number  Josephine Rudnick RN Navigator Cardiac Imaging [facility] [phone] Office [phone] Cell  

## 2022-01-07 NOTE — Telephone Encounter (Signed)
Patient returning call regarding upcoming cardiac imaging study; pt verbalizes understanding of appt date/time, parking situation and where to check in, and verified current allergies; name and call back number provided for further questions should they arise  Ariabella Brien RN Navigator Cardiac Imaging [facility] [phone] office [phone] cell  Patient denies metal or claustrophobia. 

## 2022-01-09 ENCOUNTER — Other Ambulatory Visit: Payer: Self-pay | Admitting: Physician Assistant

## 2022-01-09 ENCOUNTER — Ambulatory Visit (HOSPITAL_COMMUNITY)
Admission: RE | Admit: 2022-01-09 | Discharge: 2022-01-09 | Disposition: A | Discharge status: Home / Self Care | Payer: 59 | Source: Ambulatory Visit | Attending: Physician Assistant | Admitting: Physician Assistant

## 2022-01-09 DIAGNOSIS — N1831 Chronic kidney disease, stage 3a: Secondary | ICD-10-CM | POA: Insufficient documentation

## 2022-01-09 DIAGNOSIS — I2510 Atherosclerotic heart disease of native coronary artery without angina pectoris: Secondary | ICD-10-CM | POA: Insufficient documentation

## 2022-01-09 DIAGNOSIS — I10 Essential (primary) hypertension: Secondary | ICD-10-CM

## 2022-01-09 DIAGNOSIS — G4730 Sleep apnea, unspecified: Secondary | ICD-10-CM | POA: Diagnosis present

## 2022-01-09 DIAGNOSIS — I5032 Chronic diastolic (congestive) heart failure: Secondary | ICD-10-CM

## 2022-01-09 DIAGNOSIS — D649 Anemia, unspecified: Secondary | ICD-10-CM

## 2022-01-09 DIAGNOSIS — I517 Cardiomegaly: Secondary | ICD-10-CM

## 2022-01-09 MED ORDER — GADOBUTROL 1 MMOL/ML IV SOLN
10.0000 mL | Freq: Once | INTRAVENOUS | Status: AC | PRN
  Administered 2022-01-09: 10 mL via INTRAVENOUS

## 2022-01-13 ENCOUNTER — Telehealth: Payer: Self-pay

## 2022-01-13 DIAGNOSIS — I421 Obstructive hypertrophic cardiomyopathy: Secondary | ICD-10-CM

## 2022-01-13 NOTE — Telephone Encounter (Signed)
Cardiac MRI results discussed with patient. She agrees to wear 7 day Zio. Order placed. Patient states she has tape sensitivity, I have notified Zio of this.  I will move apt with D.Dunn,PA-C to Dr.Mallipeddi.

## 2022-01-13 NOTE — Telephone Encounter (Signed)
-----   Message from Laurann [place], [place] sent at 01/12/2022  7:43 AM EST ----- Please let pt know that cardiac MRI is positive for abnormal thickness of her heart muscle. Mainstay of treatment for this would be beta blocker which she is already on. I would recommend we get a 7 day non live zio to quantify if any VT present (dx: Hypertrophic Cardiomyopathy). She has an appt with me in 02/2022 but would see if we can instead move this up sooner after the monitor (I.e. 3 weeks) to establish with Dr. Jenene Slicker instead as she will need someone to continue to follow this.

## 2022-01-14 NOTE — Telephone Encounter (Signed)
Error

## 2022-01-21 ENCOUNTER — Other Ambulatory Visit: Payer: Self-pay | Admitting: Physician Assistant

## 2022-01-21 ENCOUNTER — Ambulatory Visit: Payer: 59 | Attending: Physician Assistant

## 2022-01-21 ENCOUNTER — Telehealth: Payer: Self-pay | Admitting: Cardiovascular Disease

## 2022-01-21 DIAGNOSIS — I493 Ventricular premature depolarization: Secondary | ICD-10-CM

## 2022-01-21 DIAGNOSIS — I421 Obstructive hypertrophic cardiomyopathy: Secondary | ICD-10-CM

## 2022-01-21 NOTE — Telephone Encounter (Signed)
Pt calling to f/u on when she should expect Heart Monitor to arrive. Please advise

## 2022-01-21 NOTE — Progress Notes (Unsigned)
Enrolled for [employer] to mail a ZIO XT long term holter monitor to the patients address on file.   Dr. Hart Rochester Mallipeddi to read.

## 2022-01-21 NOTE — Telephone Encounter (Signed)
Investigated why patient has not received her monitor. Order was place for monitor order to be processed by [facility] office.  I do not believe they process orders through a work que. Changed order to be done by [facility].  Enrolled patient for monitor to be mailed 2 day delivery from [place].  Explained to patient the [employer] (ZIO patch) is a single patch monitor.  They do not offer a sensitive skin alternative. Instructions for monitor application will be in kit and patient sent my chart message with instructions as well. Dr. Azzie Almas Mallipeddi to read.

## 2022-01-28 DIAGNOSIS — I493 Ventricular premature depolarization: Secondary | ICD-10-CM

## 2022-01-28 DIAGNOSIS — I421 Obstructive hypertrophic cardiomyopathy: Secondary | ICD-10-CM | POA: Diagnosis not present

## 2022-02-17 ENCOUNTER — Telehealth: Payer: Self-pay | Admitting: Physician Assistant

## 2022-02-17 NOTE — Telephone Encounter (Signed)
Charlie Pitter, PA-C: Plase let pt know monitor overall reassuring. One brief episode of SVT, rare skips, no afib seen, no malignant arrhythmias. Keep follow-up with Dr. Dellia Cloud to review further.

## 2022-02-17 NOTE — Telephone Encounter (Signed)
Patient notified and verbalized understanding. Patient had no questions or concerns at this time. PCP copied 

## 2022-02-17 NOTE — Telephone Encounter (Signed)
    Pt is returning call to get heart monitor result 

## 2022-02-25 ENCOUNTER — Ambulatory Visit: Payer: 59 | Admitting: Physician Assistant

## 2022-03-03 ENCOUNTER — Encounter: Payer: Self-pay | Admitting: Internal Medicine

## 2022-03-03 ENCOUNTER — Ambulatory Visit: Payer: 59 | Attending: Physician Assistant | Admitting: Internal Medicine

## 2022-03-03 VITALS — BP 185/100 | HR 108 | Ht 63.0 in | Wt 150.8 lb

## 2022-03-03 DIAGNOSIS — I422 Other hypertrophic cardiomyopathy: Secondary | ICD-10-CM | POA: Insufficient documentation

## 2022-03-03 DIAGNOSIS — I119 Hypertensive heart disease without heart failure: Secondary | ICD-10-CM

## 2022-03-03 NOTE — Progress Notes (Addendum)
Cardiology Office Note  Date: 03/03/2022   ID: [name], DOB 07-27-1980, MRN [id number]  PCP:  [facility]  Cardiologist:  Verne Carrow, MD Electrophysiologist:  None   Reason for Office Visit: Follow-up of poorly controlled HTN and apical HCM   History of Present Illness: [name] is a 42 y.o. 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  She was admitted in 09/2021 with NSTEMI and LHC showed mild CAD with elevated LVEDP. She was discharged on Jardiance and losartan as this was thought to be secondary to stent dysfunction. Cardiac MRI was performed in 12/2021 which showed severe apical hypertrophy with apical septal thickness 17 mm and no evidence of apical aneurysm or apical thrombus. No late gadolinium enhancement in the LV myocardium.  She underwent event monitor which showed 1 run of SVT with aberrancy and no evidence of nonsustained ventricular tachycardia.  She also underwent workup for secondary causes of HTN including urine/serum metanephrines, norepinephrine, ACTH stimulation test which were within normal limits.  USG renal show showed stable atrophic left kidney due to chronic mild hydronephrosis and stable adnexal cyst. Patient presented today for follow-up visit. She checks her blood pressures couple of times a week and the highest it has gone up was 160 mmHg SBP. She reports having palpitations but denied any dizziness/lightness, syncope. Denies any chest pain/SOB. Denied diaphoresis. She has 2 college going kids and she says they stress her out sometimes. She is worried about hair loss caused by one of the medications and wants treatment for that.  Past Medical History:  Diagnosis Date   Anxiety    Atrophy of left kidney    Chronic heart failure with preserved ejection fraction (HFpEF) (HCC)    Chronic  kidney disease, stage 3a (HCC)    Depression    History of kidney stones    Hydrosalpinx    Hypertension    followed by pcp   Mild CAD    Nephrolithiasis    per CT 05-06-2021  right renal stone nonostructive   Pain, female pelvic    Urgency of urination     Past Surgical History:  Procedure Laterality Date   APPENDECTOMY  age 26   ARTHOSCOPIC 59 CUFF REPAIR Right 10/31/2019   Procedure: ARTHROSCOPIC ROTATOR CUFF REPAIR;  Surgeon: Vickki Hearing, MD;  Location: [facility];  Service: Orthopedics;  Laterality: Right;   BALLOON DILATION Left 01/17/2013   Procedure: BALLOON DILATION OF LEFT URETERAL STRICTURE;  Surgeon: Ky Barban, MD;  Location: [facility];  Service: Urology;  Laterality: Left;   CYSTOSCOPY W/ URETERAL STENT PLACEMENT Left 01/17/2013   Procedure: CYSTOSCOPY WITH LEFT RETROGRADE PYELOGRAM; LEFT URETERAL STENT PLACEMENT;  Surgeon: Ky Barban, MD;  Location: [facility];  Service: Urology;  Laterality: Left;   CYSTOSCOPY WITH RETROGRADE PYELOGRAM, URETEROSCOPY AND STENT PLACEMENT Bilateral 12/23/2018   Procedure: CYSTOSCOPY WITH RETROGRADE PYELOGRAM, URETEROSCOPY AND STENT PLACEMENT;  Surgeon: Sebastian Ache, MD;  Location: [facility];  Service: Urology;  Laterality: Bilateral;  90 MINS   CYSTOSCOPY WITH RETROGRADE PYELOGRAM, URETEROSCOPY AND STENT PLACEMENT Bilateral 01/06/2019   Procedure: CYSTOSCOPY WITH RETROGRADE PYELOGRAM, URETEROSCOPY AND STENT PLACEMENT;  Surgeon: Sebastian Ache, MD;  Location: [facility];  Service: Urology;  Laterality: Bilateral;  90 MINS   CYSTOSCOPY WITH RETROGRADE PYELOGRAM, URETEROSCOPY AND STENT PLACEMENT Bilateral 11/15/2020   Procedure: CYSTOSCOPY  WITH RETROGRADE PYELOGRAM, LEFT DIAGNOSTIC URETEROSCOPY, RIGHT DIAGNOSTIC URETEROSCOPY WITH BIOPSY; RIGHT STENT PLACEMENT;  Surgeon: Alexis Frock, MD;  Location: [facility];  Service: Urology;  Laterality: Bilateral;  75 MINS    CYSTOSCOPY/URETEROSCOPY/HOLMIUM LASER/STENT PLACEMENT  2008;  2010;  2011;  2012   FLEXIBLE URETEROSCOPY Left 01/17/2013   Procedure: FLEXIBLE LEFT URETEROSCOPY; ATTEMPTED STONE BASKET EXTRACTION;  Surgeon: Marissa Nestle, MD;  Location: [facility];  Service: Urology;  Laterality: Left;   HOLMIUM LASER APPLICATION Bilateral 01/60/1093   Procedure: HOLMIUM LASER APPLICATION;  Surgeon: Alexis Frock, MD;  Location[facility];  Service: Urology;  Laterality: Bilateral;   HOLMIUM LASER APPLICATION Bilateral 23/55/7322   Procedure: HOLMIUM LASER APPLICATION;  Surgeon: Alexis Frock, MD;  Location: [facility];  Service: Urology;  Laterality: Bilateral;   LAPAROSCOPIC BILATERAL SALPINGECTOMY Bilateral 06/30/2021   Procedure: LAPAROSCOPIC BILATERAL SALPINGECTOMY; LYSIS OF ADHESIONS;  Surgeon: Carlyon Shadow, MD;  Location: [place];  Service: Gynecology;  Laterality: Bilateral;   LEFT HEART CATH AND CORONARY ANGIOGRAPHY N/A 09/17/2021   Procedure: LEFT HEART CATH AND CORONARY ANGIOGRAPHY;  Surgeon: Jettie Booze, MD;  Location: [facility];  Service: Cardiovascular;  Laterality: N/A;   PERCUTANEOUS NEPHROSTOLITHOTOMY  06/15/2011   @[facility]     Current Outpatient Medications  Medication Sig Dispense Refill   amLODipine (NORVASC) 5 MG tablet Take 1 tablet (5 mg total) by mouth daily. 90 tablet 3   aspirin EC 81 MG tablet Take 1 tablet (81 mg total) by mouth daily. Swallow whole. 30 tablet 12   atorvastatin (LIPITOR) 80 MG tablet Take 1 tablet (80 mg total) by mouth daily. 30 tablet 3   carvedilol (COREG) 25 MG tablet Take 1 tablet (25 mg total) by mouth 2 (two) times daily. 180 tablet 3   empagliflozin (JARDIANCE) 10 MG TABS tablet Take 1 tablet (10 mg total) by mouth daily. 30 tablet 3   losartan (COZAAR) 100 MG tablet Take 1 tablet (100 mg total) by mouth daily. 90 tablet 3   varenicline (CHANTIX PAK) 0.5 MG X 11 & 1 MG X 42 tablet Take  1 tablet by mouth as directed.     No current facility-administered medications for this visit.   Allergies:  Adhesive [tape]   Social History: The patient  reports that she has been smoking cigars. She has never used smokeless tobacco. She reports current alcohol use. She reports that she does not use drugs.   Family History: The patient's family history includes Hypertension in her brother, father, and mother; Thyroid disease in her father.   ROS:  Please see the history of present illness. Otherwise, complete review of systems is positive for none.  All other systems are reviewed and negative.   Physical Exam: VS:  BP (!) 185/100   Pulse (!) 108   Ht 5\' 3"  (1.6 m)   Wt 150 lb 12.8 oz (68.4 kg)   SpO2 98%   BMI 26.71 kg/m , BMI Body mass index is 26.71 kg/m.  Wt Readings from Last 3 Encounters:  03/03/22 150 lb 12.8 oz (68.4 kg)  12/03/21 149 lb (67.6 kg)  11/18/21 144 lb (65.3 kg)    General: Patient appears comfortable at rest. HEENT: Conjunctiva and lids normal, oropharynx clear with moist mucosa. Neck: Supple, no elevated JVP or carotid bruits, no thyromegaly. Lungs: Clear to auscultation, nonlabored breathing at rest. Cardiac: Regular rate and rhythm, no S3 or significant systolic murmur, no pericardial rub. Abdomen: Soft,  nontender, no hepatomegaly, bowel sounds present, no guarding or rebound. Extremities: No pitting edema, distal pulses 2+. Skin: Warm and dry. Musculoskeletal: No kyphosis. Neuropsychiatric: Alert and oriented x3, affect grossly appropriate.  ECG:  An ECG dated 09/17/2021 was personally reviewed today and demonstrated:  Normal sinus rhythm, diffuse T wave inversions  Recent Labwork: 09/16/2021: Magnesium 2.0; TSH 2.063 10/28/2021: Hemoglobin 12.3; Platelets 354 11/19/2021: ALT 19; AST 17; BUN 19; Creatinine, Ser 1.21; Potassium 3.8; Sodium 140     Component Value Date/Time   CHOL 154 09/16/2021 0326   TRIG 181 (H) 09/16/2021 0326   HDL 22 (L)  09/16/2021 0326   CHOLHDL 7.0 09/16/2021 0326   VLDL 36 09/16/2021 0326   LDLCALC 96 09/16/2021 0326    Other Studies Reviewed Today: I personally reviewed the event monitor from 12/23, cardiac MRI result and 11/23 and LHC results from 09/2021.  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 Instructed patient to check blood pressures in a.m. and p.m. and bring log of BP readings in the next clinic visit. -Cardiac MRI from 12/2021 showed no evidence of apical aneurysm or apical thrombus.  No late gadolinium enhancement seen in the LV myocardium. Event monitor from 01/2022 showed 1 run of SVT with aberrancy and no evidence of ventricular tachycardia. Will obtain 30-day event monitor to rule out any ventricular arrhythmias.  I have spent a total of 33 minutes with patient reviewing chart, EKGs, labs and examining patient as well as establishing an assessment and plan that was discussed with the patient.  > 50% of time was spent in direct patient care.  '  Medication Adjustments/Labs and Tests Ordered: Current medicines are reviewed at length with the patient today.  Concerns regarding medicines are outlined above.   Tests Ordered: No orders of the defined types were placed in this encounter.   Medication Changes: No orders of the defined types were placed in this encounter.   Disposition:  Follow up  3 months  Signed, Airabella Barley Fidel Levy, MD, 03/03/2022 11:40 AM    [place] [facility] [address] [place],  [place] [address]

## 2022-03-03 NOTE — Patient Instructions (Signed)
Medication Instructions:  Your physician recommends that you continue on your current medications as directed. Please refer to the Current Medication list given to you today.   Labwork: None  Testing/Procedures: None  Follow-Up: Follow up with Dr. Dellia Cloud in 3 months.   Any Other Special Instructions Will Be Listed Below (If Applicable).  Your physician has requested that you regularly monitor and record your blood pressure readings at home. Please use the same machine at the same time of day to check your readings and record them to bring to your follow-up visit.    If you need a refill on your cardiac medications before your next appointment, please call your pharmacy.

## 2022-03-04 ENCOUNTER — Telehealth: Payer: Self-pay | Admitting: Internal Medicine

## 2022-03-04 NOTE — Telephone Encounter (Signed)
I will need 30 day event monitor to r/o ventricular tachycardias due to thick heart muscle and her continuing to have heart palpitations. I did not discuss this with the patient during the clinic visit but please call her and let her know. She can come pick up the monitor or you can mail the monitor.   Per VM

## 2022-03-04 NOTE — Telephone Encounter (Signed)
Pt returning cal from yesterday regarding Heart Monitor. Please advise

## 2022-03-04 NOTE — Telephone Encounter (Signed)
Patient notified and verbalized understanding. 

## 2022-03-19 ENCOUNTER — Ambulatory Visit: Payer: 59

## 2022-03-19 ENCOUNTER — Telehealth: Payer: Self-pay | Admitting: Internal Medicine

## 2022-03-19 DIAGNOSIS — I493 Ventricular premature depolarization: Secondary | ICD-10-CM | POA: Diagnosis not present

## 2022-03-19 NOTE — Telephone Encounter (Signed)
Pt is requesting a call back to see if she can stop by the office to get assistance putting on the monitor she received. Please advise.

## 2022-03-19 NOTE — Telephone Encounter (Signed)
MyChart message sent to patient.

## 2022-03-19 NOTE — Telephone Encounter (Signed)
Patient to come after clinic today at 4 pm for monitor placement.

## 2022-03-23 ENCOUNTER — Ambulatory Visit: Payer: 59 | Attending: Internal Medicine

## 2022-03-23 ENCOUNTER — Other Ambulatory Visit: Payer: Self-pay

## 2022-03-23 DIAGNOSIS — I493 Ventricular premature depolarization: Secondary | ICD-10-CM

## 2022-04-16 ENCOUNTER — Encounter: Payer: Self-pay | Admitting: Radiology

## 2022-05-04 ENCOUNTER — Telehealth: Payer: Self-pay | Admitting: Internal Medicine

## 2022-05-04 NOTE — Telephone Encounter (Signed)
Please advise 

## 2022-05-04 NOTE — Telephone Encounter (Signed)
Patient is calling for her monitor results.  

## 2022-05-05 NOTE — Telephone Encounter (Signed)
Patient notified via My Chart

## 2022-06-08 ENCOUNTER — Ambulatory Visit: Payer: 59 | Attending: Internal Medicine | Admitting: Internal Medicine

## 2022-06-08 ENCOUNTER — Encounter: Payer: Self-pay | Admitting: Internal Medicine

## 2022-06-08 VITALS — BP 136/64 | HR 92 | Ht 63.0 in | Wt 164.0 lb

## 2022-06-08 DIAGNOSIS — I493 Ventricular premature depolarization: Secondary | ICD-10-CM

## 2022-06-08 DIAGNOSIS — I739 Peripheral vascular disease, unspecified: Secondary | ICD-10-CM | POA: Diagnosis not present

## 2022-06-08 NOTE — Patient Instructions (Signed)
Medication Instructions:  Your physician recommends that you continue on your current medications as directed. Please refer to the Current Medication list given to you today.  *If you need a refill on your cardiac medications before your next appointment, please call your pharmacy*   Lab Work: None If you have labs (blood work) drawn today and your tests are completely normal, you will receive your results only by: MyChart Message (if you have MyChart) OR A paper copy in the mail If you have any lab test that is abnormal or we need to change your treatment, we will call you to review the results.   Testing/Procedures: Your physician has requested that you have an ankle brachial index (ABI). During this test an ultrasound and blood pressure cuff are used to evaluate the arteries that supply the arms and legs with blood. Allow thirty minutes for this exam. There are no restrictions or special instructions.    Follow-Up: At [facility], you and your health needs are our priority.  As part of our continuing mission to provide you with exceptional heart care, we have created designated Provider Care Teams.  These Care Teams include your primary Cardiologist (physician) and Advanced Practice Providers (APPs -  Physician Assistants and Nurse Practitioners) who all work together to provide you with the care you need, when you need it.  We recommend signing up for the patient portal called "MyChart".  Sign up information is provided on this After Visit Summary.  MyChart is used to connect with patients for Virtual Visits (Telemedicine).  Patients are able to view lab/test results, encounter notes, upcoming appointments, etc.  Non-urgent messages can be sent to your provider as well.   To learn more about what you can do with MyChart, go to [email or web address].    Your next appointment:   6 month(s)  Provider:   Luane School, MD    Other Instructions

## 2022-06-11 DIAGNOSIS — I739 Peripheral vascular disease, unspecified: Secondary | ICD-10-CM | POA: Insufficient documentation

## 2022-06-11 NOTE — Progress Notes (Signed)
Cardiology Office Note  Date: 06/11/2022   ID: [name], DOB Nov 27, 1980, MRN [id number]  PCP:  [facility]  Cardiologist:  Verne Carrow, MD Electrophysiologist:  None   Reason for Office Visit: Follow-up of poorly controlled HTN and apical HCM   History of Present Illness: [name] is a 42 y.o. 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  She was admitted in 09/2021 with NSTEMI and LHC showed mild CAD with elevated LVEDP. She was discharged on Jardiance and losartan as this was thought to be secondary to diastolic dysfunction. Cardiac MRI was performed in 12/2021 which showed severe apical hypertrophy with apical septal thickness 17 mm and no evidence of apical aneurysm or apical thrombus. No late gadolinium enhancement in the LV myocardium.  She underwent event monitor which showed 1 run of SVT with aberrancy and no evidence of nonsustained ventricular tachycardia as well as 30 event monitor that did not show any evidence of atrial arrhythmias.  She also underwent workup for secondary causes of HTN including urine/serum metanephrines, norepinephrine, ACTH stimulation test which were within normal limits. USG renal show showed stable atrophic left kidney due to chronic mild hydronephrosis and stable adnexal cyst. Patient presented today for follow-up visit.  Her PCP started her on amlodipine-valsartan 10-320 mg once daily after which her blood pressures are well-controlled.  Denies any symptoms of angina, DOE, dizziness, syncope or palpitations.  Overall doing great. She has 2 college going kids and she says they stress her out sometimes. Her legs become tired when she walks and has to stop walking for symptoms to get better.  Current smoker.  Past Medical History:  Diagnosis Date   Anxiety    Atrophy of left kidney     Chronic heart failure with preserved ejection fraction (HFpEF)    Chronic kidney disease, stage 3a    Depression    History of kidney stones    Hydrosalpinx    Hypertension    followed by pcp   Mild CAD    Nephrolithiasis    per CT 05-06-2021  right renal stone nonostructive   Pain, female pelvic    Urgency of urination     Past Surgical History:  Procedure Laterality Date   APPENDECTOMY  age 10   ARTHOSCOPIC 55 CUFF REPAIR Right 10/31/2019   Procedure: ARTHROSCOPIC ROTATOR CUFF REPAIR;  Surgeon: Vickki Hearing, MD;  Location: [facility];  Service: Orthopedics;  Laterality: Right;   BALLOON DILATION Left 01/17/2013   Procedure: BALLOON DILATION OF LEFT URETERAL STRICTURE;  Surgeon: Ky Barban, MD;  Location: [facility];  Service: Urology;  Laterality: Left;   CYSTOSCOPY W/ URETERAL STENT PLACEMENT Left 01/17/2013   Procedure: CYSTOSCOPY WITH LEFT RETROGRADE PYELOGRAM; LEFT URETERAL STENT PLACEMENT;  Surgeon: Ky Barban, MD;  Location: [facility];  Service: Urology;  Laterality: Left;   CYSTOSCOPY WITH RETROGRADE PYELOGRAM, URETEROSCOPY AND STENT PLACEMENT Bilateral 12/23/2018   Procedure: CYSTOSCOPY WITH RETROGRADE PYELOGRAM, URETEROSCOPY AND STENT PLACEMENT;  Surgeon: Sebastian Ache, MD;  Location: [facility];  Service: Urology;  Laterality: Bilateral;  90 MINS   CYSTOSCOPY WITH RETROGRADE PYELOGRAM, URETEROSCOPY AND STENT PLACEMENT Bilateral 01/06/2019   Procedure: CYSTOSCOPY WITH RETROGRADE PYELOGRAM, URETEROSCOPY AND STENT PLACEMENT;  Surgeon: Sebastian Ache, MD;  Location: [facility];  Service: Urology;  Laterality: Bilateral;  90 MINS   CYSTOSCOPY WITH  RETROGRADE PYELOGRAM, URETEROSCOPY AND STENT PLACEMENT Bilateral 11/15/2020   Procedure: CYSTOSCOPY WITH RETROGRADE PYELOGRAM, LEFT DIAGNOSTIC URETEROSCOPY, RIGHT DIAGNOSTIC URETEROSCOPY WITH BIOPSY; RIGHT STENT PLACEMENT;  Surgeon: Sebastian Ache, MD;  Location: [facility];  Service: Urology;  Laterality: Bilateral;  75 MINS   CYSTOSCOPY/URETEROSCOPY/HOLMIUM LASER/STENT PLACEMENT  2008;  2010;  2011;  2012   FLEXIBLE URETEROSCOPY Left 01/17/2013   Procedure: FLEXIBLE LEFT URETEROSCOPY; ATTEMPTED STONE BASKET EXTRACTION;  Surgeon: Ky Barban, MD;  Location: [facility];  Service: Urology;  Laterality: Left;   HOLMIUM LASER APPLICATION Bilateral 12/23/2018   Procedure: HOLMIUM LASER APPLICATION;  Surgeon: Sebastian Ache, MD;  Location: [facility];  Service: Urology;  Laterality: Bilateral;   HOLMIUM LASER APPLICATION Bilateral 01/06/2019   Procedure: HOLMIUM LASER APPLICATION;  Surgeon: Sebastian Ache, MD;  Location: [facility];  Service: Urology;  Laterality: Bilateral;   LAPAROSCOPIC BILATERAL SALPINGECTOMY Bilateral 06/30/2021   Procedure: LAPAROSCOPIC BILATERAL SALPINGECTOMY; LYSIS OF ADHESIONS;  Surgeon: Lyn Henri, MD;  Location[facility];  Service: Gynecology;  Laterality: Bilateral;   LEFT HEART CATH AND CORONARY ANGIOGRAPHY N/A 09/17/2021   Procedure: LEFT HEART CATH AND CORONARY ANGIOGRAPHY;  Surgeon: Corky Crafts, MD;  Location: [facility];  Service: Cardiovascular;  Laterality: N/A;   PERCUTANEOUS NEPHROSTOLITHOTOMY  06/15/2011   @[facility]     Current Outpatient Medications  Medication Sig Dispense Refill   amLODipine-valsartan (EXFORGE) 10-320 MG tablet Take 1 tablet by mouth daily.     aspirin EC 81 MG tablet Take 1 tablet (81 mg total) by mouth daily. Swallow whole. 30 tablet 12   atorvastatin (LIPITOR) 80 MG tablet Take 1 tablet (80 mg total) by mouth daily. 30 tablet 3   carvedilol (COREG) 25 MG tablet Take 1 tablet (25 mg total) by mouth 2 (two) times daily. 180 tablet 3   empagliflozin (JARDIANCE) 10 MG TABS tablet Take 1 tablet (10 mg total) by mouth daily. 30 tablet 3   varenicline (CHANTIX PAK) 0.5 MG X 11 & 1 MG X 42 tablet Take 1 tablet by mouth as directed.      No current facility-administered medications for this visit.   Allergies:  Adhesive [tape]   Social History: The patient  reports that she has been smoking cigars. She has never used smokeless tobacco. She reports current alcohol use. She reports that she does not use drugs.   Family History: The patient's family history includes Hypertension in her brother, father, and mother; Thyroid disease in her father.   ROS:  Please see the history of present illness. Otherwise, complete review of systems is positive for none.  All other systems are reviewed and negative.   Physical Exam: VS:  BP 136/64   Pulse 92   Ht 5\' 3"  (1.6 m)   Wt 164 lb (74.4 kg)   SpO2 98%   BMI 29.05 kg/m , BMI Body mass index is 29.05 kg/m.  Wt Readings from Last 3 Encounters:  06/08/22 164 lb (74.4 kg)  03/03/22 150 lb 12.8 oz (68.4 kg)  12/03/21 149 lb (67.6 kg)    General: Patient appears comfortable at rest. HEENT: Conjunctiva and lids normal, oropharynx clear with moist mucosa. Neck: Supple, no elevated JVP or carotid bruits, no thyromegaly. Lungs: Clear to auscultation, nonlabored breathing at rest. Cardiac: Regular rate and rhythm, no S3 or significant systolic murmur, no pericardial rub. Abdomen: Soft, nontender, no hepatomegaly, bowel sounds present, no guarding or rebound. Extremities: No pitting edema, distal  pulses 2+. Skin: Warm and dry. Musculoskeletal: No kyphosis. Neuropsychiatric: Alert and oriented x3, affect grossly appropriate.  ECG:  An ECG dated 09/17/2021 was personally reviewed today and demonstrated:  Normal sinus rhythm, diffuse T wave inversions (LVH with repolarization abnormality).  Recent Labwork: 09/16/2021: Magnesium 2.0; TSH 2.063 10/28/2021: Hemoglobin 12.3; Platelets 354 11/19/2021: ALT 19; AST 17; BUN 19; Creatinine, Ser 1.21; Potassium 3.8; Sodium 140     Component Value Date/Time   CHOL 154 09/16/2021 0326   TRIG 181 (H) 09/16/2021 0326   HDL 22 (L) 09/16/2021 0326    CHOLHDL 7.0 09/16/2021 0326   VLDL 36 09/16/2021 0326   LDLCALC 96 09/16/2021 0326    Other Studies Reviewed Today: I personally reviewed the event monitor from 12/23, cardiac MRI result and 11/23 and LHC results from 09/2021.  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  Otherwise, continue carvedilol 25 mg twice daily and Jardiance 10 mg once daily. Pressures were in the range of 130 mmHg SBP at home. -Cardiac MRI from 12/2021 showed no evidence of apical aneurysm or apical thrombus.  No late gadolinium enhancement seen in the LV myocardium. 2 week event monitor from 01/2022 showed 1 run of SVT with aberrancy and no evidence of ventricular tachycardia.  30-day event monitor subsequently obtained showed no evidence of atrial arrhythmias.  Patient has no risk factors to be referred to EP for ICD candidacy.  # Bilateral leg claudication: Obtain ABI with USG arterial Doppler lower EXTR  I have spent a total of 33 minutes with patient reviewing chart, EKGs, labs and examining patient as well as establishing an assessment and plan that was discussed with the patient.  > 50% of time was spent in direct patient care.  '  Medication Adjustments/Labs and Tests Ordered: Current medicines are reviewed at length with the patient today.  Concerns regarding medicines are outlined above.   Tests Ordered: Orders Placed This Encounter  Procedures   US ARTERIAL ABI (SCREENING LOWER EXTREMITY)   EKG 12-Lead    Medication  Changes: No orders of the defined types were placed in this encounter.   Disposition:  Follow up  6 months  Signed, Chalice Philbert Verne Spurr, MD, 06/11/2022 10:17 AM    [facility] at [facility] [address] [place], [place] [address]

## 2022-06-16 ENCOUNTER — Ambulatory Visit (HOSPITAL_COMMUNITY)
Admission: RE | Admit: 2022-06-16 | Discharge: 2022-06-16 | Disposition: A | Discharge status: Home / Self Care | Payer: 59 | Source: Ambulatory Visit | Attending: Internal Medicine | Admitting: Internal Medicine

## 2022-06-16 DIAGNOSIS — I739 Peripheral vascular disease, unspecified: Secondary | ICD-10-CM | POA: Insufficient documentation

## 2022-06-25 ENCOUNTER — Other Ambulatory Visit: Payer: Self-pay

## 2022-06-25 DIAGNOSIS — R0609 Other forms of dyspnea: Secondary | ICD-10-CM

## 2022-07-01 ENCOUNTER — Ambulatory Visit (HOSPITAL_COMMUNITY)
Admission: RE | Admit: 2022-07-01 | Discharge: 2022-07-01 | Disposition: A | Discharge status: Home / Self Care | Payer: 59 | Source: Ambulatory Visit | Attending: Family Medicine | Admitting: Family Medicine

## 2022-07-01 DIAGNOSIS — R0609 Other forms of dyspnea: Secondary | ICD-10-CM | POA: Diagnosis present

## 2022-07-01 LAB — EXERCISE TOLERANCE TEST
Angina Index: 0
Base ST Depression (mm): 0 mm
Duke Treadmill Score: 6
Estimated workload: 7
Exercise duration (min): 5 min
Exercise duration (sec): 37 s
MPHR: 179 {beats}/min
Peak HR: 130 {beats}/min
Percent HR: 72 %
RPE: 15
Rest HR: 72 {beats}/min
ST Depression (mm): 0 mm

## 2022-07-02 ENCOUNTER — Telehealth: Payer: Self-pay

## 2022-07-02 NOTE — Telephone Encounter (Signed)
Patient notified via My Chart

## 2022-07-02 NOTE — Telephone Encounter (Signed)
-----   Message from Marjo Bicker, MD sent at 07/01/2022  7:03 PM EDT ----- Pain in the legs bilaterally with exercise but was able to walk for 5 minutes. ABIs showed borderline PAD and this should not cause severe pains in her legs.Let's stop atorvastatin and evaluate her leg pains as this can also cause cramps/pain in legs.

## 2022-09-14 ENCOUNTER — Telehealth: Payer: Self-pay | Admitting: Internal Medicine

## 2022-09-14 NOTE — Telephone Encounter (Signed)
I called patient back and expressed to her that Dr.Branch wanted her to go to the ER for evaluation. She said "I'm at work, nobody [place] take my route. I will go when I get off at 5 o clock "

## 2022-09-14 NOTE — Telephone Encounter (Signed)
I spoke with patient and she reports being awakened from sleep at 1 am with SOB. She said she was afraid to go back to sleep. She denies CP but has had SOB in the past as her anginal equivalent. She does say she has some nausea and she just doesn't feel like her usual self but cannot be more explicit. She describes taking deep breaths as painful and the SOB has "come and go " all day.  I encouraged her to go to the ER for evaluation, I will FYI Dr.Branch

## 2022-09-14 NOTE — Telephone Encounter (Signed)
Pt c/o of Chest Pain: STAT if active CP, including tightness, pressure, jaw pain, radiating pain to shoulder/upper arm/back, CP unrelieved by Nitro. Symptoms reported of SOB, nausea, vomiting, sweating.  1. Are you having CP right now? No     2. Are you experiencing any other symptoms (ex. SOB, nausea, vomiting, sweating)? No    3. Is your CP continuous or coming and going? Coming and going    4. Have you taken Nitroglycerin? No    5. How long have you been experiencing CP? Started around 1 am this morning waking her from her sleep.     6. If NO CP at time of call then end call with telling Pt to call back or call 911 if Chest pain returns prior to return call from triage team.

## 2022-09-14 NOTE — Telephone Encounter (Signed)
Needs ER evaluation  Emily Abts MD

## 2022-09-14 NOTE — Telephone Encounter (Signed)
Patient notified and verbalized understanding to seek ER evaluation per providers recommendation. Pt had no further concerns or questions at this time.

## 2022-09-16 ENCOUNTER — Ambulatory Visit (INDEPENDENT_AMBULATORY_CARE_PROVIDER_SITE_OTHER): Payer: 59 | Admitting: Internal Medicine

## 2022-09-16 ENCOUNTER — Other Ambulatory Visit: Payer: Self-pay

## 2022-09-16 ENCOUNTER — Encounter: Payer: Self-pay | Admitting: Internal Medicine

## 2022-09-16 VITALS — BP 173/102 | HR 85 | Temp 98.8°F | Resp 16 | Wt 161.0 lb

## 2022-09-16 DIAGNOSIS — R8271 Bacteriuria: Secondary | ICD-10-CM | POA: Diagnosis not present

## 2022-09-16 NOTE — Progress Notes (Signed)
[facility]      Reason for Consult: positive urine culture    Referring Physician: Terie Purser PA    Patient ID: [name], female    DOB: 01/29/1981, 42 y.o.   MRN: [id number]  HPI:   Ms Jean Rosenthal is here for evaluation of persistently positive urine cultures and concern for UTI.   She has a history of kidney stones, smoker and has frequent visits to her PCPs office for concern for UTIs.  She reports no symptoms of urinary tract infections including no dysuria, no pyuria, no fever or other concerns.  Her only complaint is a strong urinary odor that she notes and diminishes when she takes antibiotics.  By report, she now has highly resistant E coli in her urine.  There is one urine culture from last year during her hospitalization that shows a panresistent E coli.  She insists that she needs antibiotics because she can't stand the smell.     Past Medical History:  Diagnosis Date   Anxiety    Atrophy of left kidney    Chronic heart failure with preserved ejection fraction (HFpEF) (HCC)    Chronic kidney disease, stage 3a (HCC)    Depression    History of kidney stones    Hydrosalpinx    Hypertension    followed by pcp   Mild CAD    Nephrolithiasis    per CT 05-06-2021  right renal stone nonostructive   Pain, female pelvic    Urgency of urination     Prior to Admission medications   Medication Sig Start Date End Date Taking? Authorizing Provider  amLODipine-valsartan (EXFORGE) 10-320 MG tablet Take 1 tablet by mouth daily. 04/23/22  Yes [provider]  aspirin EC 81 MG tablet Take 1 tablet (81 mg total) by mouth daily. Swallow whole. 11/03/21  Yes Bhagat, Bhavinkumar, PA  atorvastatin (LIPITOR) 80 MG tablet Take 80 mg by mouth daily. 08/14/22  Yes [provider]  carvedilol (COREG) 25 MG tablet Take 1 tablet (25 mg total) by mouth 2 (two) times daily. 10/21/21 10/21/22 Yes Dunn, Dayna N, PA-C  empagliflozin (JARDIANCE) 10 MG  TABS tablet Take 1 tablet (10 mg total) by mouth daily. 11/03/21  Yes Bhagat, Bhavinkumar, PA  varenicline (CHANTIX PAK) 0.5 MG X 11 & 1 MG X 42 tablet Take 1 tablet by mouth as directed. 10/14/21  Yes [provider]    Allergies  Allergen Reactions   Adhesive [Tape] Rash    Social History   Tobacco Use   Smoking status: Some Days    Types: Cigars   Smokeless tobacco: Never   Tobacco comments:    06-25-2021  per pt 6 per week, black mal small cigar's  Vaping Use   Vaping status: Never Used  Substance Use Topics   Alcohol use: Not Currently    Comment: occasional   Drug use: Never    Family History  Problem Relation Age of Onset   Hypertension Mother    Hypertension Father    Thyroid disease Father    Hypertension Brother    CAD Neg Hx     Review of Systems  Constitutional: negative for fevers and chills Genitourinary: negative for frequency, dysuria, nocturia, urinary incontinence, and hematuria All other systems reviewed and are negative    Constitutional: in no apparent distress  Vitals:   09/16/22 0855  BP: (!) 173/102  Pulse: 85  Resp: 16  Temp: 98.8 F (37.1 C)  SpO2: 98%   EYES: anicteric Respiratory: normal respiratory effort Musculoskeletal: no edema  Labs: Lab Results  Component Value Date   WBC 12.9 (H) 10/28/2021   HGB 12.3 10/28/2021   HCT 38.7 10/28/2021   MCV 84.5 10/28/2021   PLT 354 10/28/2021    Lab Results  Component Value Date   CREATININE 1.21 (H) 11/19/2021   BUN 19 11/19/2021   NA 140 11/19/2021   K 3.8 11/19/2021   CL 112 (H) 11/19/2021   CO2 23 11/19/2021    Lab Results  Component Value Date   ALT 19 11/19/2021   AST 17 11/19/2021   ALKPHOS 89 11/19/2021   BILITOT 0.6 11/19/2021   INR 1.0 09/15/2021     Assessment: Asymptomatic bacteruria.  I reviewed with her symptoms of a symptomatic UTI that include dysuria, pyuria, fever, chills.  I also discussed with her at length that a urinary odor in isolation is  not c/w a true UTI and can be associated with asymptomatic bacteruria and does not warrant treatment.  From our discussion, she has received a lot of antibiotics over the years simply for the odor and it is unclear why she is getting many prescriptions for antibiotics.  I discussed that I recommend to refrain from inappropriate antibiotic use and only use when indicated and it can be very detrimental for her long term health to develop resistance to antibiotics and potential for medication side effects with no indication.    Plan: 1)  continue supportive care 2) refrain from unnecessary antibiotic use.    Thanks for the consultation and she can return as needed

## 2022-10-20 ENCOUNTER — Other Ambulatory Visit (HOSPITAL_COMMUNITY): Payer: Self-pay | Admitting: Family Medicine

## 2022-10-20 DIAGNOSIS — G44309 Post-traumatic headache, unspecified, not intractable: Secondary | ICD-10-CM

## 2022-10-25 ENCOUNTER — Ambulatory Visit (HOSPITAL_COMMUNITY)
Admission: RE | Admit: 2022-10-25 | Discharge: 2022-10-25 | Disposition: A | Discharge status: Home / Self Care | Payer: 59 | Source: Ambulatory Visit | Attending: Family Medicine | Admitting: Family Medicine

## 2022-10-25 ENCOUNTER — Encounter (HOSPITAL_COMMUNITY): Payer: Self-pay

## 2022-10-25 ENCOUNTER — Ambulatory Visit (HOSPITAL_COMMUNITY): Payer: 59

## 2022-10-25 DIAGNOSIS — G44309 Post-traumatic headache, unspecified, not intractable: Secondary | ICD-10-CM | POA: Diagnosis present

## 2022-11-03 ENCOUNTER — Encounter: Payer: Self-pay | Admitting: Neurology

## 2022-11-04 ENCOUNTER — Encounter: Payer: Self-pay | Admitting: Neurology

## 2022-11-04 ENCOUNTER — Ambulatory Visit (INDEPENDENT_AMBULATORY_CARE_PROVIDER_SITE_OTHER): Payer: 59 | Admitting: Neurology

## 2022-11-04 VITALS — BP 180/102 | HR 82 | Ht 63.0 in | Wt 155.0 lb

## 2022-11-04 DIAGNOSIS — E236 Other disorders of pituitary gland: Secondary | ICD-10-CM

## 2022-11-04 DIAGNOSIS — I10 Essential (primary) hypertension: Secondary | ICD-10-CM

## 2022-11-04 DIAGNOSIS — G44319 Acute post-traumatic headache, not intractable: Secondary | ICD-10-CM | POA: Diagnosis not present

## 2022-11-04 MED ORDER — DULOXETINE HCL 30 MG PO CPEP: 30.0000 mg | ORAL_CAPSULE | Freq: Every day | ORAL | 5 refills | Status: AC

## 2022-11-04 MED ORDER — NORTRIPTYLINE HCL 10 MG PO CAPS: 10.0000 mg | ORAL_CAPSULE | Freq: Every day | ORAL | 5 refills | Status: DC

## 2022-11-04 NOTE — Patient Instructions (Addendum)
Start duloxetine 30mg  daily.  If no improvement in headaches in 4 weeks (before refill), contact me and I will increase dose Limit use of pain relievers to no more than 2 days out of week to prevent risk of rebound or medication-overuse headache. Refer to the eye doctor.  If you do not hear from anyone in one week, contact me. DR. Sinda Du, [address] [place], [place], [place] [address], Phone: [phone] Otherwise, follow up in 6 months Follow up with PCP ASAP regarding blood pressure
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
## 2022-11-05 ENCOUNTER — Other Ambulatory Visit (HOSPITAL_COMMUNITY): Payer: Self-pay | Admitting: Family Medicine

## 2022-11-05 DIAGNOSIS — I2510 Atherosclerotic heart disease of native coronary artery without angina pectoris: Secondary | ICD-10-CM

## 2022-11-11 ENCOUNTER — Telehealth: Payer: Self-pay

## 2022-11-11 DIAGNOSIS — E236 Other disorders of pituitary gland: Secondary | ICD-10-CM

## 2022-11-11 NOTE — Telephone Encounter (Signed)
Note received from Dr. Sinda Du office They are not in network with the patient insurance plan. They do not have anything until 2025 for new patient's.

## 2022-11-11 NOTE — Telephone Encounter (Signed)
Patient is requesting to be referred to another doctor

## 2022-11-12 ENCOUNTER — Ambulatory Visit (HOSPITAL_COMMUNITY)
Admission: RE | Admit: 2022-11-12 | Discharge: 2022-11-12 | Disposition: A | Discharge status: Home / Self Care | Payer: 59 | Source: Ambulatory Visit | Attending: Family Medicine | Admitting: Family Medicine

## 2022-11-12 DIAGNOSIS — I2510 Atherosclerotic heart disease of native coronary artery without angina pectoris: Secondary | ICD-10-CM | POA: Insufficient documentation

## 2022-11-24 NOTE — Addendum Note (Signed)
Addended by: Leida Lauth on: 11/24/2022 03:10 PM   Modules accepted: Orders

## 2022-11-24 NOTE — Telephone Encounter (Signed)
Mychart message to patient, I would send her to an optometry practice.  I don't know anybody specifically..  She can go to anyone but she needs to have them fax their note/findings to me.  When she sees an optometrist, she should send [place] a mychart message with the provider's name and number.    Referral added

## 2022-12-14 ENCOUNTER — Ambulatory Visit: Payer: 59 | Admitting: Internal Medicine

## 2022-12-29 ENCOUNTER — Ambulatory Visit: Payer: 59 | Admitting: Neurology

## 2023-01-12 ENCOUNTER — Encounter: Payer: Self-pay | Admitting: Internal Medicine

## 2023-01-12 ENCOUNTER — Ambulatory Visit: Payer: 59 | Attending: Internal Medicine | Admitting: Internal Medicine

## 2023-01-12 VITALS — BP 178/100 | HR 85 | Ht 63.0 in | Wt 145.8 lb

## 2023-01-12 DIAGNOSIS — I493 Ventricular premature depolarization: Secondary | ICD-10-CM

## 2023-01-12 DIAGNOSIS — I110 Hypertensive heart disease with heart failure: Secondary | ICD-10-CM

## 2023-01-12 DIAGNOSIS — I422 Other hypertrophic cardiomyopathy: Secondary | ICD-10-CM | POA: Diagnosis not present

## 2023-01-12 DIAGNOSIS — I119 Hypertensive heart disease without heart failure: Secondary | ICD-10-CM | POA: Diagnosis not present

## 2023-01-12 DIAGNOSIS — I5032 Chronic diastolic (congestive) heart failure: Secondary | ICD-10-CM | POA: Diagnosis not present

## 2023-01-12 DIAGNOSIS — Z720 Tobacco use: Secondary | ICD-10-CM | POA: Insufficient documentation

## 2023-01-12 MED ORDER — SPIRONOLACTONE 25 MG PO TABS: 25.0000 mg | ORAL_TABLET | Freq: Every day | ORAL | 3 refills | Status: AC

## 2023-01-12 MED ORDER — SACUBITRIL-VALSARTAN 49-51 MG PO TABS: 1.0000 | ORAL_TABLET | Freq: Two times a day (BID) | ORAL | 11 refills | Status: DC

## 2023-01-12 NOTE — Patient Instructions (Addendum)
Medication Instructions:  Your physician has recommended you make the following change in your medication:   -Stop Losartan -Start Entresto 49-51 mg tablets twice daily.  -Start Spironolactone 25 mg tablet once daily.   *If you need a refill on your cardiac medications before your next appointment, please call your pharmacy*   Lab Work: None If you have labs (blood work) drawn today and your tests are completely normal, you will receive your results only by: MyChart Message (if you have MyChart) OR A paper copy in the mail If you have any lab test that is abnormal or we need to change your treatment, we will call you to review the results.   Testing/Procedures: None   Follow-Up: At [facility], you and your health needs are our priority.  As part of our continuing mission to provide you with exceptional heart care, we have created designated Provider Care Teams.  These Care Teams include your primary Cardiologist (physician) and Advanced Practice Providers (APPs -  Physician Assistants and Nurse Practitioners) who all work together to provide you with the care you need, when you need it.  We recommend signing up for the patient portal called "MyChart".  Sign up information is provided on this After Visit Summary.  MyChart is used to connect with patients for Virtual Visits (Telemedicine).  Patients are able to view lab/test results, encounter notes, upcoming appointments, etc.  Non-urgent messages can be sent to your provider as well.   To learn more about what you can do with MyChart, go to [email or web address].    Your next appointment:   3 month(s)  Provider:   You may see Vishnu P Mallipeddi, MD or one of the following Advanced Practice Providers on your designated Care Team:   [place], PA-C  Jacolyn Reedy, PA-C     Other Instructions You have been referred to the Hypertrophic Cardiomyopathy Clinic. They will call you with your first appointment.

## 2023-01-12 NOTE — Progress Notes (Signed)
Cardiology Office Note  Date: 01/12/2023   ID: [name], DOB 1980/11/20, MRN [id number]  PCP:  [facility]  Cardiologist:  Marjo Bicker, MD Electrophysiologist:  None    History of Present Illness: [name] is a 42 y.o. 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  She was admitted in 09/2021 with NSTEMI and LHC showed mild CAD with elevated LVEDP. She was discharged on Jardiance and losartan as this was thought to be secondary to diastolic dysfunction. Cardiac MRI was performed in 12/2021 which showed severe apical hypertrophy with apical septal thickness 17 mm and no evidence of apical aneurysm or apical thrombus. No late gadolinium enhancement in the LV myocardium. She underwent event monitor which showed 1 run of SVT with aberrancy and no evidence of nonsustained ventricular tachycardia as well as 30 event monitor that did not show any evidence of atrial arrhythmias. She also underwent workup for secondary causes of HTN including urine/serum metanephrines, norepinephrine, ACTH stimulation test which were within normal limits. USG renal showed stable atrophic left kidney due to chronic mild hydronephrosis and stable adnexal cyst. Patient presented today for follow-up visit.  Previously on amlodipine-valsartan started by PCP which was later switched to losartan 100 mg once daily.  She is not sure if she is taking amlodipine 10 mg once daily at home. She is going through a lot with her to college going kids who are stressing her out. Has SOB and chest pains sometimes due to this.  She does have pain in her legs when she walks, ABIs were performed that showed blunted monophasic dorsalis pedis waveform in both the lower extremities, L ABI 0.97, consistent with borderline PAD.  No dizziness, syncope or leg swelling.   Past  Medical History:  Diagnosis Date   Anxiety    Atrophy of left kidney    Chronic heart failure with preserved ejection fraction (HFpEF) (HCC)    Chronic kidney disease, stage 3a (HCC)    Depression    History of kidney stones    Hydrosalpinx    Hypertension    followed by pcp   Mild CAD    Nephrolithiasis    per CT 05-06-2021  right renal stone nonostructive   Pain, female pelvic    Urgency of urination     Past Surgical History:  Procedure Laterality Date   APPENDECTOMY  age 19   ARTHOSCOPIC 109 CUFF REPAIR Right 10/31/2019   Procedure: ARTHROSCOPIC ROTATOR CUFF REPAIR;  Surgeon: Vickki Hearing, MD;  Location: [facility];  Service: Orthopedics;  Laterality: Right;   BALLOON DILATION Left 01/17/2013   Procedure: BALLOON DILATION OF LEFT URETERAL STRICTURE;  Surgeon: Ky Barban, MD;  Location: [facility];  Service: Urology;  Laterality: Left;   CYSTOSCOPY W/ URETERAL STENT PLACEMENT Left 01/17/2013   Procedure: CYSTOSCOPY WITH LEFT RETROGRADE PYELOGRAM; LEFT URETERAL STENT PLACEMENT;  Surgeon: Ky Barban, MD;  Location: [facility];  Service: Urology;  Laterality: Left;   CYSTOSCOPY WITH RETROGRADE PYELOGRAM, URETEROSCOPY AND STENT PLACEMENT Bilateral 12/23/2018   Procedure: CYSTOSCOPY WITH RETROGRADE PYELOGRAM, URETEROSCOPY AND STENT PLACEMENT;  Surgeon: Sebastian Ache, MD;  Location: [facility];  Service: Urology;  Laterality: Bilateral;  90 MINS   CYSTOSCOPY WITH RETROGRADE PYELOGRAM, URETEROSCOPY AND STENT PLACEMENT Bilateral 01/06/2019   Procedure: CYSTOSCOPY WITH RETROGRADE PYELOGRAM, URETEROSCOPY AND STENT PLACEMENT;  Surgeon: Sebastian Ache,  MD;  Location: [facility];  Service: Urology;  Laterality: Bilateral;  90 MINS   CYSTOSCOPY WITH RETROGRADE PYELOGRAM, URETEROSCOPY AND STENT PLACEMENT Bilateral 11/15/2020   Procedure: CYSTOSCOPY WITH RETROGRADE PYELOGRAM, LEFT DIAGNOSTIC URETEROSCOPY, RIGHT DIAGNOSTIC URETEROSCOPY WITH BIOPSY;  RIGHT STENT PLACEMENT;  Surgeon: Sebastian Ache, MD;  Location: [facility];  Service: Urology;  Laterality: Bilateral;  75 MINS   CYSTOSCOPY/URETEROSCOPY/HOLMIUM LASER/STENT PLACEMENT  2008;  2010;  2011;  2012   FLEXIBLE URETEROSCOPY Left 01/17/2013   Procedure: FLEXIBLE LEFT URETEROSCOPY; ATTEMPTED STONE BASKET EXTRACTION;  Surgeon: Ky Barban, MD;  Location: [facility];  Service: Urology;  Laterality: Left;   HOLMIUM LASER APPLICATION Bilateral 12/23/2018   Procedure: HOLMIUM LASER APPLICATION;  Surgeon: Sebastian Ache, MD;  Location: [facility];  Service: Urology;  Laterality: Bilateral;   HOLMIUM LASER APPLICATION Bilateral 01/06/2019   Procedure: HOLMIUM LASER APPLICATION;  Surgeon: Sebastian Ache, MD;  Location: [facility];  Service: Urology;  Laterality: Bilateral;   LAPAROSCOPIC BILATERAL SALPINGECTOMY Bilateral 06/30/2021   Procedure: LAPAROSCOPIC BILATERAL SALPINGECTOMY; LYSIS OF ADHESIONS;  Surgeon: Lyn Henri, MD;  Location: [facility];  Service: Gynecology;  Laterality: Bilateral;   LEFT HEART CATH AND CORONARY ANGIOGRAPHY N/A 09/17/2021   Procedure: LEFT HEART CATH AND CORONARY ANGIOGRAPHY;  Surgeon: Corky Crafts, MD;  Location: [facility] INVASIVE CV LAB;  Service: Cardiovascular;  Laterality: N/A;   PERCUTANEOUS NEPHROSTOLITHOTOMY  06/15/2011   @[facility]     Current Outpatient Medications  Medication Sig Dispense Refill   aspirin EC 81 MG tablet Take 1 tablet (81 mg total) by mouth daily. Swallow whole. 30 tablet 12   atorvastatin (LIPITOR) 80 MG tablet Take 80 mg by mouth daily.     carvedilol (COREG) 25 MG tablet Take 1 tablet (25 mg total) by mouth 2 (two) times daily. 180 tablet 3   DULoxetine (CYMBALTA) 30 MG capsule Take 1 capsule (30 mg total) by mouth daily. 30 capsule 5   empagliflozin (JARDIANCE) 10 MG TABS tablet Take 1 tablet (10 mg total) by mouth daily. 30 tablet 3   losartan (COZAAR) 100 MG  tablet Take 100 mg by mouth daily.     nortriptyline (PAMELOR) 10 MG capsule Take 10 mg by mouth at bedtime.     No current facility-administered medications for this visit.   Allergies:  Adhesive [tape]   Social History: The patient  reports that she has been smoking cigars. She has never used smokeless tobacco. She reports that she does not currently use alcohol. She reports that she does not use drugs.   Family History: The patient's family history includes Hypertension in her brother, father, and mother; Thyroid disease in her father.   ROS:  Please see the history of present illness. Otherwise, complete review of systems is positive for none.  All other systems are reviewed and negative.   Physical Exam: VS:  BP (!) 182/100   Pulse 85   Ht 5\' 3"  (1.6 m)   Wt 145 lb 12.8 oz (66.1 kg)   SpO2 98%   BMI 25.83 kg/m , BMI Body mass index is 25.83 kg/m.  Wt Readings from Last 3 Encounters:  01/12/23 145 lb 12.8 oz (66.1 kg)  11/04/22 155 lb (70.3 kg)  09/16/22 161 lb (73 kg)    General: Patient appears comfortable at rest. HEENT: Conjunctiva and lids normal, oropharynx clear with moist mucosa. Neck: Supple, no elevated JVP or carotid bruits, no thyromegaly. Lungs: Clear to auscultation, nonlabored  breathing at rest. Cardiac: Regular rate and rhythm, no S3 or significant systolic murmur, no pericardial rub. Abdomen: Soft, nontender, no hepatomegaly, bowel sounds present, no guarding or rebound. Extremities: No pitting edema, distal pulses 2+. Skin: Warm and dry. Musculoskeletal: No kyphosis. Neuropsychiatric: Alert and oriented x3, affect grossly appropriate.  Recent Labwork: No results found for requested labs within last 365 days.     Component Value Date/Time   CHOL 154 09/16/2021 0326   TRIG 181 (H) 09/16/2021 0326   HDL 22 (L) 09/16/2021 0326   CHOLHDL 7.0 09/16/2021 0326   VLDL 36 09/16/2021 0326   LDLCALC 96 09/16/2021 0326    Other Studies Reviewed Today: I  personally reviewed the event monitor from 12/23, cardiac MRI result and 11/23 and LHC results from 09/2021.  Assessment and Plan:  Apical HCM Diastolic heart failure Hypertensive heart disease HTN, poorly controlled Borderline PAD Nicotine abuse   -Cardiac MRI in 11/23 showed apical HCM 17 mm with no evidence of apical aneurysm or thrombus. No LGE. Event monitor did not show evidence of ventricular arrhythmias. No indication of ICD at this time. However her blood pressures are poorly controlled, she does have evidence of diastolic heart failure based on LHC in 2023 (moderately elevated LVEDP).  Currently on Jardiance 10 mg once daily, will switch losartan to Entresto 49-51 mg twice daily and add spironolactone 25 mg once daily.  She is not sure if she is taking amlodipine at home.  Continue other antihypertensive, carvedilol 25 mg twice daily.  Hopefully her blood pressures are controlled with the above regimen. Will place referral to HCM clinic for further management of apical HCM. -L ABI was 0.97, borderline PAD, current smoker.  Currently undergoing a lot of stress.  Counseling.   I spent 30 minutes reviewing prior notes, imaging reports, discussed the management of HCM and HTN.  Reviewed medications, placed orders and documenting the findings in the note.   Medication Adjustments/Labs and Tests Ordered: Current medicines are reviewed at length with the patient today.  Concerns regarding medicines are outlined above.   Tests Ordered: Orders Placed This Encounter  Procedures   EKG 12-Lead    Medication Changes: No orders of the defined types were placed in this encounter.   Disposition:  Follow up  3 months  Signed, Emmie Frakes Verne Spurr, MD, 01/12/2023 2:26 PM    [facility] [address] [place], [place] [address]

## 2023-01-13 ENCOUNTER — Telehealth: Payer: Self-pay | Admitting: Internal Medicine

## 2023-01-13 NOTE — Telephone Encounter (Signed)
Pt c/o medication issue:  1. Name of Medication:   sacubitril-valsartan (ENTRESTO) 49-51 MG    2. How are you currently taking this medication (dosage and times per day)? As written  3. Are you having a reaction (difficulty breathing--STAT)? no  4. What is your medication issue? Pt cannot afford medication, Wants to know if there is another medication she can be put on

## 2023-01-13 NOTE — Telephone Encounter (Signed)
Spoke to pt who stated that Sherryll Burger is costing her $196 per month. Pt will come to [place] office on Tuesday to pick up patient assistance paperwork. Pt had no further questions at this time.

## 2023-03-22 ENCOUNTER — Ambulatory Visit: Payer: 59 | Attending: Internal Medicine | Admitting: Internal Medicine

## 2023-03-22 ENCOUNTER — Ambulatory Visit: Payer: 59 | Admitting: Genetic Counselor

## 2023-03-22 VITALS — BP 190/80 | HR 88 | Ht 63.0 in | Wt 137.0 lb

## 2023-03-22 DIAGNOSIS — I160 Hypertensive urgency: Secondary | ICD-10-CM | POA: Diagnosis not present

## 2023-03-22 DIAGNOSIS — I5032 Chronic diastolic (congestive) heart failure: Secondary | ICD-10-CM

## 2023-03-22 DIAGNOSIS — I110 Hypertensive heart disease with heart failure: Secondary | ICD-10-CM

## 2023-03-22 DIAGNOSIS — I422 Other hypertrophic cardiomyopathy: Secondary | ICD-10-CM

## 2023-03-22 MED ORDER — VALSARTAN 160 MG PO TABS: 160.0000 mg | ORAL_TABLET | Freq: Every day | ORAL | 3 refills | Status: AC

## 2023-03-22 NOTE — Patient Instructions (Signed)
Medication Instructions:  Your physician has recommended you make the following change in your medication:  START: Valsartan 160 mg by mouth once daily  *If you need a refill on your cardiac medications before your next appointment, please call your pharmacy*   Lab Work: IN 2 WEEKS: BMP   If you have labs (blood work) drawn today and your tests are completely normal, you will receive your results only by: MyChart Message (if you have MyChart) OR A paper copy in the mail If you have any lab test that is abnormal or we need to change your treatment, we will call you to review the results.   Testing/Procedures: NONE   Follow-Up: At [facility], you and your health needs are our priority.  As part of our continuing mission to provide you with exceptional heart care, we have created designated Provider Care Teams.  These Care Teams include your primary Cardiologist (physician) and Advanced Practice Providers (APPs -  Physician Assistants and Nurse Practitioners) who all work together to provide you with the care you need, when you need it.  We recommend signing up for the patient portal called "MyChart".  Sign up information is provided on this After Visit Summary.  MyChart is used to connect with patients for Virtual Visits (Telemedicine).  Patients are able to view lab/test results, encounter notes, upcoming appointments, etc.  Non-urgent messages can be sent to your provider as well.   To learn more about what you can do with MyChart, go to [email or web address].    Your next appointment:   2 month(s)  Provider:   You may see Vishnu P Mallipeddi, MD or one of the following Advanced Practice Providers on your designated Care Team:   [place], PA-C  Jacolyn Reedy, [place]

## 2023-03-22 NOTE — Progress Notes (Signed)
Cardiology Office Note:  .    Date:  03/22/2023  ID:  [name], DOB [birth year], MRN [id number] PCP: [name]  [facility] Providers Cardiologist:  Emily Bicker, MD     CC: Told to come Consulted for the evaluation of Apical HCM vs HTN heart disease at the behest of Dr. Jenene [name]   History of Present Illness: .    [name] is a 43 y.o. female e with hypertension and possible hypertrophic cardiomyopathy who presents for evaluation and genetic testing. She was referred by a previous doctor for evaluation of her heart condition.  She has a history of hypertension diagnosed approximately five to six years ago, with previously normal blood pressure during routine physicals. She attributes her elevated blood pressure to significant stress. She was previously prescribed sacubitril/valsartan [name]) for her heart condition but did not take it due to cost. No family history of hypertension is noted.  She denies significant chest pain but occasionally experiences discomfort at work. No episodes of syncope or presyncope are reported. She notes no swelling and mentions a significant weight loss from 150 pounds to 137 pounds, which she attributes to stress rather than intentional weight loss.  She describes experiencing headaches since an accident on August 15th of the previous year, initially managed with Tylenol and later switched to Cymbalta, which she notes is also used for depression. Recently, she had a bad cold and now experiences a cough primarily at night, which causes an instant headache on the left side of her head, requiring her to apply pressure to alleviate the pain.  She has a history of chronic kidney disease and recurrent kidney stones. She does not currently see a nephrologist but consults a urologist when she has kidney stones.  There is no known family history of heart problems, sudden cardiac death, or hypertrophic  cardiomyopathy. She has a brother and two sons, one of whom used to play sports. She notes a family history of high blood pressure among other family members.  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  Social- from [place], has one brother and two sons ROS: As per HPI.   Studies Reviewed: .   Cardiac Studies & Procedures   CARDIAC CATHETERIZATION  CARDIAC CATHETERIZATION 09/17/2021  Narrative   Mid LAD lesion is 20% stenosed.   The left ventricular systolic function is normal.   LV end diastolic pressure is moderately elevated.   The left ventricular ejection fraction is greater than 65% by visual estimate.   There is no aortic valve stenosis.  No significant CAD.  No evidence of SCAD.  Hyperdynamic LV function.  Short ascending aorta.  JL3 required to engage the left main.  Mild right radial vasospasm noted.  Extra verapamil given during the end of the case.  Continue BP and diastolic heart failure management.  Findings Coronary Findings Diagnostic  Dominance: Right  Left Anterior Descending Mid LAD lesion is 20% stenosed.  Intervention  No interventions have been documented.   STRESS TESTS  EXERCISE TOLERANCE TEST (ETT) 07/01/2022  Narrative   Exercise tolerance test to assess for symptoms of claudication and walking distance.   Patient excercised for 5 minutes and 37 seconds. Pain in the legs began within few seconds of walking which progressively worsened due to which the test had to be stopped.  ECHOCARDIOGRAM  ECHOCARDIOGRAM COMPLETE 09/16/2021  Narrative ECHOCARDIOGRAM REPORT  Patient Name:   [name] Date of Exam: 09/16/2021 Medical Rec #:  [id number]        Height:       63.0 in Accession #:    [id number]       Weight:       148.6 lb Date of Birth:  May 10, 1980        BSA:          1.704 m Patient Age:    41 years         BP:           132/80 mmHg Patient Gender: F                 HR:           65 bpm. Exam Location:  Inpatient  Procedure: Cardiac Doppler, Color Doppler and Intracardiac Opacification Agent  Indications:    NSTEMI  History:        Patient has no prior history of Echocardiogram examinations. Angina; Signs/Symptoms:Dyspnea. NSTEMI.  Sonographer:    [name] RDCS (AE, PE) Referring Phys: [id number] [name]  IMPRESSIONS   1. Left ventricular ejection fraction, by estimation, is 65 to 70%. The left ventricle has normal function. The left ventricle has no regional wall motion abnormalities. There is severe concentric left ventricular hypertrophy, best seen in PSAX views. Left ventricular diastolic parameters are consistent with Grade II diastolic dysfunction (pseudonormalization). No evidence of left apical aneurysm. 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 2. Right ventricular systolic function is normal. The right ventricular size is normal. There is normal pulmonary artery systolic pressure. 3. The mitral valve is normal in structure. No evidence of mitral valve regurgitation. No evidence of mitral stenosis. 4. The aortic valve was not well visualized. Aortic valve regurgitation is not visualized. No aortic stenosis is present.  Conclusion(s)/Recommendation(s): If negative ischemic work up, cardiac MRI may be considered to assess for etiology of hypertrophy.  FINDINGS Left Ventricle: Left ventricular ejection fraction, by estimation, is 65 to 70%. The left ventricle has normal function. The left ventricle has no regional wall motion abnormalities. The left ventricular internal cavity size was normal in size. There is severe concentric left ventricular hypertrophy. Left ventricular diastolic parameters are consistent with Grade II diastolic dysfunction (pseudonormalization).  Right Ventricle: The right ventricular size is normal. No increase in right ventricular wall thickness. Right ventricular systolic  function is normal. There is normal pulmonary artery systolic pressure. The tricuspid regurgitant velocity is 2.48 m/s, and with an assumed right atrial pressure of 3 mmHg, the estimated right ventricular systolic pressure is 27.6 mmHg.  Left Atrium: Left atrial size was normal in size.  Right Atrium: Right atrial size was normal in size.  Pericardium: There is no evidence of pericardial effusion.  Mitral Valve: The mitral valve is normal in structure. No evidence of mitral valve regurgitation. No evidence of mitral valve stenosis.  Tricuspid Valve: The tricuspid valve is normal in structure. Tricuspid valve regurgitation is not demonstrated. No evidence of tricuspid stenosis.  Aortic Valve: The aortic valve was not well visualized. Aortic valve regurgitation is not visualized. No aortic stenosis is present.  Pulmonic Valve: The pulmonic valve was normal in structure. Pulmonic valve regurgitation is not visualized. No evidence of pulmonic stenosis.  Aorta: The aortic root and ascending aorta are structurally normal, with no evidence of dilitation.  IAS/Shunts: No atrial level shunt detected by color flow Doppler.   LEFT VENTRICLE PLAX 2D LVOT diam:  1.90 cm   Diastology LV SV:         68        LV e' medial:    6.20 cm/s LV SV Index:   40        LV E/e' medial:  14.1 LVOT Area:     2.84 cm  LV e' lateral:   4.90 cm/s LV E/e' lateral: 17.8   RIGHT VENTRICLE RV S prime:     12.50 cm/s TAPSE (M-mode): 1.4 cm  LEFT ATRIUM             Index        RIGHT ATRIUM           Index LA Vol (A2C):   28.8 ml 16.90 ml/m  RA Area:     11.00 cm LA Vol (A4C):   33.4 ml 19.60 ml/m  RA Volume:   22.10 ml  12.97 ml/m LA Biplane Vol: 33.2 ml 19.48 ml/m AORTIC VALVE LVOT Vmax:   112.00 cm/s LVOT Vmean:  83.100 cm/s LVOT VTI:    0.241 m  AORTA Ao Asc diam: 2.65 cm  MITRAL VALVE               TRICUSPID VALVE MV Area (PHT): 3.93 cm    TR Peak grad:   24.6 mmHg MV Decel Time: 193 msec     TR Vmax:        248.00 cm/s MV E velocity: 87.40 cm/s MV A velocity: 73.70 cm/s  SHUNTS MV E/A ratio:  1.19        Systemic VTI:  0.24 m Systemic Diam: 1.90 cm  Riley Lam MD Electronically signed by Riley Lam MD Signature Date/Time: 09/16/2021/1:10:54 PM    Final   MONITORS  CARDIAC EVENT MONITOR 03/23/2022  Narrative   Patient had min HR 59 bpm, max HR 146 bpm and avg HR 91 bpm. Underlying rhythm was normal sinus.   No evidence of AFib/AFlutter, SVT or ventricular arrythmias   No evidence of pauses or AV blocks   <1% PAC burden and <1% PVC burden   Symptoms correlated with sinus tachycardia   CARDIAC MRI  MR CARDIAC MORPHOLOGY W WO CONTRAST 01/09/2022  Narrative CLINICAL DATA:  Clinical question of hypertrophic cardiomyopathy Study assumes HCT of 39 and BSA of 1.73 m2.  EXAM: CARDIAC MRI  TECHNIQUE: The patient was scanned on a 1.5 Tesla GE magnet. A dedicated cardiac coil was used. Functional imaging was done using Fiesta sequences. 2,3, and 4 chamber views were done to assess for RWMA's. Modified Simpson's rule using a short axis stack was used to calculate an ejection fraction on a dedicated work [employer]. The patient received 10 cc of Gadavist. After 10 minutes inversion recovery sequences were used to assess for infiltration and scar tissue. Flow quantification was performed 2 times during this examination with flow quantification performed at the levels of the ascending aorta above the valve, pulmonary artery above the valve.  CONTRAST:  10 cc  of Gadavist  FINDINGS: 1. Normal left ventricular size, with LVEDD 49 mm, and LVEDVi 74 mL/m2.  Severe apical hypertrophy with apical septal thickness 17 mm and myocardial mass index of 107 g/m2. There is no apical aneurysm. There is no apical thrombus.  Normal left ventricular systolic function (LVEF =56%). There are no regional wall motion abnormalities. There is no  LVOT obstruction.  Left ventricular parametric mapping notable for normal ECV and T2 signal.  There is no late gadolinium enhancement  in the left ventricular myocardium. Six standard deviation assessment used.  2. Normal right ventricular size with RVEDVI 52 mL/m2.  Normal right ventricular thickness.  Normal right ventricular systolic function (RVEF =62%). There are no regional wall motion abnormalities or aneurysms.  3.  Normal left and right atrial size.  4. Normal size of the aortic root, ascending aorta and pulmonary artery.  5. Valve assessment:  Aortic Valve: Tri-leaflet aortic valve. There is no significant regurgitation. Regurgitant fraction 3%.  Pulmonic Valve: There is no significant regurgitation. Regurgitant fraction < 1%.  Tricuspid Valve: There is no significant regurgitation. Regurgitant fraction < 1%.  Mitral Valve: There is mild regurgitation. Regurgitant fraction 5 %.  6.  Normal pericardium.  Trivial, inferior, pericardial effusion.  7. Grossly, no extracardiac findings. Recommended dedicated study if concerned for non-cardiac pathology.  IMPRESSION: Apical septal hypertrophy 17 mm with no apical aneurysm or thrombus.  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  Riley Lam MD   Electronically Signed By: Riley Lam M.D. On: 01/09/2022 17:03          Physical Exam:    VS:  BP (!) 190/80 (BP Location: Right Arm)   Pulse 88   Ht 5\' 3"  (1.6 m)   Wt 137 lb (62.1 kg)   SpO2 98%   BMI 24.27 kg/m    Wt Readings from Last 3 Encounters:  03/22/23 137 lb (62.1 kg)  01/12/23 145 lb 12.8 oz (66.1 kg)  11/04/22 155 lb (70.3 kg)    Gen: no distress   Neck: No JVD Cardiac: No Rubs or Gallops, No murmur, RRR +2 radial pulses Respiratory: Clear to auscultation bilaterally, normal effort, normal  respiratory rate GI: Soft, nontender, non-distended  MS: No  edema;  moves all  extremities Integument: Skin feels warm Neuro:  At time of evaluation, alert and oriented to person/place/time/situation  Psych: Normal affect, patient feels ok   ASSESSMENT AND PLAN: .    Hypertensive Urgency Long-standing hypertension with current BP at 190 mmHg. Suboptimal antihypertensive therapy due to cost constraints. Discussed aggressive BP control to prevent complications. Valsartan is a more affordable alternative to [facility], though less effective. - Start valsartan 160 mg - Check labs in two weeks in [place] - Follow up in a couple of months in [place] (goal eventually to be back on Entresto, but she as HA that may be BP related)  Apical Hypertrophic Cardiomyopathy Thickening of heart walls, particularly at the apex. Differential includes hypertrophic cardiomyopathy vs. hypertension-induced changes. No symptoms of dyspnea or chest pain. Previous MRI and heart monitor showed no scar, aneurysm, or arrhythmias. Discussed genetic testing for differentiation and implications for family members. - Refer to clinical geneticist for genetic testing; this would affect her diagnosis in addition to her family - Low risk for SCD - NYHA I  Chronic Kidney Disease Chronic kidney disease and nephrolithiasis. BP control is crucial to prevent further kidney damage. Discussed importance of managing BP to prevent worsening kidney function. - Manage BP aggressively with valsartan and current therapy  Headaches Chronic headaches exacerbated by stress and recent URI. Previous neurologist prescribed Cymbalta, which may also treat depression. Headaches severe enough to require pressure on the head during coughing. Discussed monitoring headache patterns and severity. - BP goal 130/80 - discussed smoking cessation  Follow-up - Follow up in a couple of months in [place] - Check labs in two weeks in [place] - she would like to see me only PRN if  issues specific to HCM arise  Time Spent  Directly with Patient:   I have spent a total of 43 minutes with the patient reviewing notes, imaging, EKGs, labs,  and examining the patient as well as establishing an assessment and plan that was discussed personally with the patient. Discussed disease state education. Reviewed care and plan in collaboration with clinical genetics.   Riley Lam, MD FASE [facility] Cardiologist [facility]  [address] [place], [address] [place], [place] [address] [phone]  2:45 PM

## 2023-03-23 NOTE — Progress Notes (Signed)
Pre Test Genetic Consult  Referral Reason  [name], a new HCM patient, is referred for genetic consult and testing of hypertrophic cardiomyopathy.   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 She reports feeling bad on September 15, 2021 and went to the ER suspecting a heart attack. She underwent a cardiac cath that detected a small blockage that did not necessitate intervention. However, she was found to have an abnormal EKG and was referred to a cardiologist. Subsequent imaging studies detected hypertrophy of the apical wall of 1.7 cm without an aneurysm, thrombus and scarring.   She reports being symptomatic for the last 2 months with dyspnea, intense a fatigue from her legs hurting and heart palpitations. Denies chest discomfort, dizziness or syncope.  Traditional Risk Factors [name] reports having HTN for the last  5 years that is not well-controlled with medication.  Family history  Relation to [place] Pedigree # Current age Heart condition/age of onset Notes  Sons, 2 IV.5, IV.6 22, 20 None Older son had 2 concussion swhil eplaying football in HS and did undergo a cardiac eval at that time  Grandchildren None           Brother III.1 44 None Echo/EKG to be done  Nephew, nieces IV.1-IV.4 13, 12, 12 (NOT TWINS), 9 None         Father II.4 19 None   Paternal uncles, 2 II.2-II.3 50s None Both died of cancer @ 25s  Paternal aunt II.1 Deceased None Killed by husband  Paternal grandfather 1.1 Deceased Unaware Died @ ? of ?  Paternal grandmother I.2 56 None         Mother II.5 38 None   Maternal aunts, 3 II.6-II.8 2- DECEASED, 70S None Both died of cancer @ ?  Maternal uncles, 3 II.9-II.11 60s, Deceased  None II.11- Died of cancer @ ?  Maternal grandfather I.3 Deceased Unaware Died @ ? of ?  Maternal grandmother I.4 Deceased Unaware Died @ ? of ?   Genetics Trace was counseled on the genetics of  hypertrophic cardiomyopathy (HCM). I explained to the patient that this is an autosomal dominant condition with incomplete penetrance i.e. not all individuals harboring the HCM mutation will present clinically with HCM, and age-related penetrance where clinical presentation of HCM increases with advanced age. Variability in clinical expression is also seen in families with HCM with affected family members presenting clinically at different ages and with symptoms ranging from mild to severe.  Since HCM is an autosomal dominant condition, first degree-relatives are at a 50% risk of inheriting this condition. They should seek regular surveillance for HCM.  First-degree relatives include her sons, parents, and brother. At this time her nephews and niece do not need to undergo screening- they can be screened if they are symptomatic or if their parent is found to have HCM.    Clinical screening of first-degree relatives involves echocardiogram and EKG at regular intervals, frequency is typically determined by age, with children undergoing screening every year until the age of 40 and those over the age of 50 getting screened every 3-5 years until the age of 2. Patient verbalized understanding of this.  Also briefly discussed the inheritance pattern and treatment /management plans for the infiltrative cardiomyopathies that present as HCM phenocopies. About 8-10% of HCM patients can have compound and digenic sarcomeric mutations for HCM  Patient should be aware that genetic testing is a probabilistic test  dependent upon age and severity of presentation, presence of risk factors for HCM and importantly family history of HCM or sudden death in first-degree relatives. The potential outcomes of genetic testing and subsequent management of at-risk family members is listed below-  If a mutation is not identified then it is important that he understands that HCM is a genetic condition and can be passed down to his  children. All first-degree relatives should undergo regular screening for HCM.  A negative test result can be due to limitations of the genetic test.   There is also the likelihood of identifying a "Variant of unknown significance". This result means that the variant has not been detected in a statistically significant number of HCM patients and/or functional studies have not been performed to verify its pathogenicity. This VUS can be tested in the family to see if it segregates with disease. If a VUS is found, first-degree relatives should undergo regular clinical screening for HCM, but genetic testing for the VUS is otherwise not warranted.  If a pathogenic variant is reported, then first-degree family members can get tested for this variant. If they test positive, it is likely they will develop HCM. In light of variable expression and incomplete penetrance associated with HCM, it is not possible to predict when they will manifest clinically with HCM. It is recommended that family members that test positive for the familial pathogenic variant pursue clinical screening for HCM. Family members that test negative for the familial mutation need not pursue periodic screening for HCM, but seek care if symptoms develop.   Impression  [name] was found to have cardiac wall thickness suggestive of HCM at age 63 in the presence of other cardiac loading conditions such as her uncontrolled HTN that can lead to cardiac hypertrophy. There is no family history of HCM or sudden death. 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  Genetic testing is recommended to confirm his diagnosis. This test should include the major sarcomeric genes involved in HCM, namely MYBPPC3, MYH7, TNNI3, TNNT2, TPM1, ACTC1, MYL2 and MYL3. It should also include the genes involved in HCM phenocopies as cardiac-predominant forms of these conditions present  clinically as HCM. These include genes for Fabry disease (GLA), Danon disease (LAMP2), WPW syndrome (PRKAG2), Familial transthyretin amyloidosis (TTR) and phospholamban (PLN).  In addition, patient should be aware of protections afforded by the Genetic Information Non-Discrimination Act (GINA). GINA protects a patient from losing their employment or health insurance based on their genotype. However, these protections do not cover life insurance and disability. Explained to the patient that family members that are found to have the familial genetic mutation will be denied life insurance even if they are asymptomatic and do not exhibit clinical signs of HCM. She verbalized understanding and reports that her children have coverage.   Please note that the patient has not been counseled in this visit on other personal, cultural, or ethical issues that she may face due to her heart condition.   Plan After a thorough discussion of the risk and benefits of genetic testing for HCM, [name] declines genetic testing stating that her adult children and brother can be screened for HCM. She will discuss HCM screening guidelines with her kids.     Sidney Ace, Ph.D, [facility] Clinical Molecular Geneticist
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
## 2023-05-05 ENCOUNTER — Ambulatory Visit: Payer: 59 | Admitting: Neurology

## 2023-05-17 ENCOUNTER — Ambulatory Visit: Payer: 59 | Attending: Internal Medicine | Admitting: Internal Medicine

## 2023-05-17 ENCOUNTER — Other Ambulatory Visit (HOSPITAL_COMMUNITY)
Admission: RE | Admit: 2023-05-17 | Discharge: 2023-05-17 | Disposition: A | Discharge status: Home / Self Care | Source: Ambulatory Visit | Attending: Internal Medicine | Admitting: Internal Medicine

## 2023-05-17 ENCOUNTER — Encounter: Payer: Self-pay | Admitting: Internal Medicine

## 2023-05-17 VITALS — BP 180/108 | HR 66 | Wt 135.0 lb

## 2023-05-17 DIAGNOSIS — I10 Essential (primary) hypertension: Secondary | ICD-10-CM | POA: Diagnosis present

## 2023-05-17 LAB — BASIC METABOLIC PANEL WITH GFR
Anion gap: 7 (ref 5–15)
BUN: 17 mg/dL (ref 6–20)
CO2: 24 mmol/L (ref 22–32)
Calcium: 9.1 mg/dL (ref 8.9–10.3)
Chloride: 106 mmol/L (ref 98–111)
Creatinine, Ser: 1.19 mg/dL — ABNORMAL HIGH (ref 0.44–1.00)
GFR, Estimated: 59 mL/min — ABNORMAL LOW (ref >60)
Glucose, Bld: 105 mg/dL — ABNORMAL HIGH (ref 70–99)
Potassium: 3.5 mmol/L (ref 3.5–5.1)
Sodium: 137 mmol/L (ref 135–145)

## 2023-05-17 NOTE — Patient Instructions (Signed)
 Medication Instructions:  Your physician recommends that you continue on your current medications as directed. Please refer to the Current Medication list given to you today.  *If you need a refill on your cardiac medications before your next appointment, please call your pharmacy*  Lab Work: BMP  If you have labs (blood work) drawn today and your tests are completely normal, you will receive your results only by: MyChart Message (if you have MyChart) OR A paper copy in the mail If you have any lab test that is abnormal or we need to change your treatment, we will call you to review the results.  Testing/Procedures: None  Follow-Up: At [facility], you and your health needs are our priority.  As part of our continuing mission to provide you with exceptional heart care, our providers are all part of one team.  This team includes your primary Cardiologist (physician) and Advanced Practice Providers or APPs (Physician Assistants and Nurse Practitioners) who all work together to provide you with the care you need, when you need it.  Your next appointment:   6 month(s)  Provider:   You may see Vishnu P Mallipeddi, MD or one of the following Advanced Practice Providers on your designated Care Team:   [place], PA-C  [place], [place] Jacolyn Reedy, [place]     We recommend signing up for the patient portal called "MyChart".  Sign up information is provided on this After Visit Summary.  MyChart is used to connect with patients for Virtual Visits (Telemedicine).  Patients are able to view lab/test results, encounter notes, upcoming appointments, etc.  Non-urgent messages can be sent to your provider as well.   To learn more about what you can do with MyChart, go to [email or web address].   Other Instructions

## 2023-05-17 NOTE — Progress Notes (Signed)
 Cardiology Office Note  Date: 05/17/2023   ID: [name], DOB [birth year], MRN [id number]  PCP:  [facility]  Cardiologist:  Marjo Bicker, MD Electrophysiologist:  None    History of Present Illness: [name] is a 43 y.o. 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  She was admitted in 09/2021 with NSTEMI and LHC showed mild CAD with elevated LVEDP. She was discharged on Jardiance and losartan as this was thought to be secondary to diastolic dysfunction. Cardiac MRI was performed in 12/2021 which showed severe apical hypertrophy with apical septal thickness 17 mm and no evidence of apical aneurysm or apical thrombus. No late gadolinium enhancement in the LV myocardium. She underwent event monitor which showed 1 run of SVT with aberrancy and no evidence of nonsustained ventricular tachycardia as well as 30 event monitor that did not show any evidence of atrial or ventricular arrhythmias. She also underwent workup for secondary causes of HTN including urine/serum metanephrines, norepinephrine, ACTH stimulation test which were within normal limits. USG renal showed stable atrophic left kidney due to chronic mild hydronephrosis and stable adnexal cyst.  ABIs were performed that showed blunted monophasic dorsalis pedis waveform in both the lower extremities, L ABI 0.97, consistent with borderline PAD. Seen by HCM clinic, Entresto switched to valsartan due to cost issues and genetic testing sent out.  Patient presented today for follow-up visit.   She reported taking antihypertensive medications as needed at home.  She takes these meds only when she gets headaches.  She reports having a lot of stress at home.  She has 2 kids.  She had chest pain for the last 2 days but not anymore.   Past Medical History:  Diagnosis Date    Anxiety    Atrophy of left kidney    Chronic heart failure with preserved ejection fraction (HFpEF) (HCC)    Chronic kidney disease, stage 3a (HCC)    Depression    History of kidney stones    Hydrosalpinx    Hypertension    followed by pcp   Mild CAD    Nephrolithiasis    per CT 05-06-2021  right renal stone nonostructive   Pain, female pelvic    Urgency of urination     Past Surgical History:  Procedure Laterality Date   APPENDECTOMY  age 71   ARTHOSCOPIC 16 CUFF REPAIR Right 10/31/2019   Procedure: ARTHROSCOPIC ROTATOR CUFF REPAIR;  Surgeon: Vickki Hearing, MD;  Location: [facility];  Service: Orthopedics;  Laterality: Right;   BALLOON DILATION Left 01/17/2013   Procedure: BALLOON DILATION OF LEFT URETERAL STRICTURE;  Surgeon: Ky Barban, MD;  Location: [facility];  Service: Urology;  Laterality: Left;   CYSTOSCOPY W/ URETERAL STENT PLACEMENT Left 01/17/2013   Procedure: CYSTOSCOPY WITH LEFT RETROGRADE PYELOGRAM; LEFT URETERAL STENT PLACEMENT;  Surgeon: Ky Barban, MD;  Location: [facility];  Service: Urology;  Laterality: Left;   CYSTOSCOPY WITH RETROGRADE PYELOGRAM, URETEROSCOPY AND STENT PLACEMENT Bilateral 12/23/2018   Procedure: CYSTOSCOPY WITH RETROGRADE PYELOGRAM, URETEROSCOPY AND STENT PLACEMENT;  Surgeon: Sebastian Ache, MD;  Location: [facility];  Service: Urology;  Laterality: Bilateral;  90 MINS   CYSTOSCOPY WITH RETROGRADE PYELOGRAM, URETEROSCOPY AND STENT PLACEMENT Bilateral 01/06/2019   Procedure: CYSTOSCOPY WITH RETROGRADE PYELOGRAM, URETEROSCOPY AND STENT PLACEMENT;  Surgeon: Sebastian Ache, MD;  Location: [facility];  Service: Urology;  Laterality: Bilateral;  90 MINS   CYSTOSCOPY WITH RETROGRADE PYELOGRAM, URETEROSCOPY AND STENT PLACEMENT Bilateral 11/15/2020   Procedure: CYSTOSCOPY WITH RETROGRADE PYELOGRAM, LEFT DIAGNOSTIC URETEROSCOPY, RIGHT DIAGNOSTIC URETEROSCOPY WITH BIOPSY; RIGHT STENT PLACEMENT;  Surgeon: Sebastian Ache, MD;  Location: [facility];  Service: Urology;  Laterality: Bilateral;  75 MINS   CYSTOSCOPY/URETEROSCOPY/HOLMIUM LASER/STENT PLACEMENT  2008;  2010;  2011;  2012   FLEXIBLE URETEROSCOPY Left 01/17/2013   Procedure: FLEXIBLE LEFT URETEROSCOPY; ATTEMPTED STONE BASKET EXTRACTION;  Surgeon: Ky Barban, MD;  Location: [facility];  Service: Urology;  Laterality: Left;   HOLMIUM LASER APPLICATION Bilateral 12/23/2018   Procedure: HOLMIUM LASER APPLICATION;  Surgeon: Sebastian Ache, MD;  Location: [facility];  Service: Urology;  Laterality: Bilateral;   HOLMIUM LASER APPLICATION Bilateral 01/06/2019   Procedure: HOLMIUM LASER APPLICATION;  Surgeon: Sebastian Ache, MD;  Location: [facility];  Service: Urology;  Laterality: Bilateral;   LAPAROSCOPIC BILATERAL SALPINGECTOMY Bilateral 06/30/2021   Procedure: LAPAROSCOPIC BILATERAL SALPINGECTOMY; LYSIS OF ADHESIONS;  Surgeon: Lyn Henri, MD;  Location: [facility];  Service: Gynecology;  Laterality: Bilateral;   LEFT HEART CATH AND CORONARY ANGIOGRAPHY N/A 09/17/2021   Procedure: LEFT HEART CATH AND CORONARY ANGIOGRAPHY;  Surgeon: Corky Crafts, MD;  Location: [facility] INVAS[facility] CV [facility];  Service: Cardiovascular;  Laterality: N/A;   PERCUTANEOUS NEPHROSTOLITHOTOMY  06/15/2011   @[facility]     Current Outpatient Medications  Medication Sig Dispense Refill   aspirin EC 81 MG tablet Take 1 tablet (81 mg total) by mouth daily. Swallow whole. 30 tablet 12   atorvastatin (LIPITOR) 80 MG tablet Take 80 mg by mouth daily.     carvedilol (COREG) 25 MG tablet Take 1 tablet (25 mg total) by mouth 2 (two) times daily. 180 tablet 3   DULoxetine (CYMBALTA) 30 MG capsule Take 1 capsule (30 mg total) by mouth daily. 30 capsule 5   empagliflozin (JARDIANCE) 10 MG TABS tablet Take 1 tablet (10 mg total) by mouth daily. 30 tablet 3   nortriptyline (PAMELOR) 10 MG capsule Take 10 mg by mouth at  bedtime.     spironolactone (ALDACTONE) 25 MG tablet Take 1 tablet (25 mg total) by mouth daily. 90 tablet 3   valsartan (DIOVAN) 160 MG tablet Take 1 tablet (160 mg total) by mouth daily. 90 tablet 3   No current facility-administered medications for this visit.   Allergies:  Adhesive [tape]   Social History: The patient  reports that she has been smoking cigars. She has never used smokeless tobacco. She reports that she does not currently use alcohol. She reports that she does not use drugs.   Family History: The patient's family history includes Hypertension in her brother, father, and mother; Thyroid disease in her father.   ROS:  Please see the history of present illness. Otherwise, complete review of systems is positive for none.  All other systems are reviewed and negative.   Physical Exam: VS:  BP (!) 200/100 (BP Location: Left Arm, Patient Position: Sitting, Cuff Size: Normal)   Pulse 66   Wt 135 lb (61.2 kg)   SpO2 100%   BMI 23.91 kg/m , BMI Body mass index is 23.91 kg/m.  Wt Readings from Last 3 Encounters:  05/17/23 135 lb (61.2 kg)  03/22/23 137 lb (62.1 kg)  01/12/23 145 lb 12.8 oz (66.1 kg)    General: Patient appears comfortable at rest. HEENT: Conjunctiva and lids normal, oropharynx clear with moist  mucosa. Neck: Supple, no elevated JVP or carotid bruits, no thyromegaly. Lungs: Clear to auscultation, nonlabored breathing at rest. Cardiac: Regular rate and rhythm, no S3 or significant systolic murmur, no pericardial rub. Abdomen: Soft, nontender, no hepatomegaly, bowel sounds present, no guarding or rebound. Extremities: No pitting edema, distal pulses 2+. Skin: Warm and dry. Musculoskeletal: No kyphosis. Neuropsychiatric: Alert and oriented x3, affect grossly appropriate.  Recent Labwork: No results found for requested labs within last 365 days.     Component Value Date/Time   CHOL 154 09/16/2021 0326   TRIG 181 (H) 09/16/2021 0326   HDL 22 (L)  09/16/2021 0326   CHOLHDL 7.0 09/16/2021 0326   VLDL 36 09/16/2021 0326   LDLCALC 96 09/16/2021 0326    Other Studies Reviewed Today: I personally reviewed the event monitor from 12/23, cardiac MRI result and 11/23 and LHC results from 09/2021.  Assessment and Plan:  Apical HCM Chronic diastolic heart failure Hypertensive heart disease HTN, poorly controlled Borderline PAD Nicotine abuse   -Not compliant with antihypertensive medications at home.  She takes antihypertensive medications as needed only when she gets headaches.  Noncompliance due to stress at home from her kids.  I had a lengthy discussion with her about the risks of poorly controlled HTN including MI, stroke, dialysis etc and strongly encouraged her to be compliant with medications.  She verbalized understanding of these recommendations.  Will continue current medications.  Will not make any changes.  She did not take BP meds this a.m., BP in the clinic today is 180/108 mmHg.  Will obtain BMP (serum creatinine was mildly elevated previously) as she had evidence of renal artery stenosis on 2023 annual ultrasound Doppler.  If elevated, will send a referral to vascular surgery for renal angiogram. -Cardiac MRI in 11/23 showed apical HCM 17 mm with no evidence of apical aneurysm or thrombus. No LGE. Event monitor did not show evidence of ventricular arrhythmias. No indication of ICD at this time.  Seen by HCM clinic.  Genetic testing was sent out. -L ABI was 0.97, borderline PAD, current smoker. Currently undergoing a lot of stress.  Counseling.   Medication Adjustments/Labs and Tests Ordered: Current medicines are reviewed at length with the patient today.  Concerns regarding medicines are outlined above.   Tests Ordered: No orders of the defined types were placed in this encounter.   Medication Changes: No orders of the defined types were placed in this encounter.   Disposition:  Follow up  6 months  Signed, Jamiere Gulas  Verne Spurr, MD, 05/17/2023 9:52 AM    [facility] [address], [place], [place] [address]

## 2023-05-25 ENCOUNTER — Telehealth: Payer: Self-pay | Admitting: Internal Medicine

## 2023-05-25 ENCOUNTER — Ambulatory Visit: Payer: 59 | Admitting: Student

## 2023-05-25 DIAGNOSIS — I701 Atherosclerosis of renal artery: Secondary | ICD-10-CM

## 2023-05-25 NOTE — Telephone Encounter (Signed)
 Pt calling in regards to results. Please advise

## 2023-05-28 NOTE — Telephone Encounter (Signed)
-----   Message from Vishnu P Mallipeddi sent at 05/27/2023  4:45 PM EDT ----- Serum creatinine improved. Will repeat USG renal artery duplex scan to evaluate for renal artery stenosis.

## 2023-05-28 NOTE — Telephone Encounter (Signed)
 The patient has been notified of the result and verbalized understanding.  All questions (if any) were answered. Roseanne Reno, CMA 05/28/2023 8:11 AM

## 2023-07-05 ENCOUNTER — Other Ambulatory Visit (HOSPITAL_COMMUNITY): Payer: Self-pay | Admitting: Family Medicine

## 2023-07-05 DIAGNOSIS — N63 Unspecified lump in unspecified breast: Secondary | ICD-10-CM

## 2023-07-13 ENCOUNTER — Ambulatory Visit (HOSPITAL_COMMUNITY)
Admission: RE | Admit: 2023-07-13 | Discharge: 2023-07-13 | Disposition: A | Discharge status: Home / Self Care | Source: Ambulatory Visit | Attending: Family Medicine | Admitting: Family Medicine

## 2023-07-13 ENCOUNTER — Encounter (HOSPITAL_COMMUNITY): Payer: Self-pay

## 2023-07-13 DIAGNOSIS — N63 Unspecified lump in unspecified breast: Secondary | ICD-10-CM | POA: Insufficient documentation

## 2023-07-13 DIAGNOSIS — N6321 Unspecified lump in the left breast, upper outer quadrant: Secondary | ICD-10-CM | POA: Diagnosis not present

## 2023-07-13 DIAGNOSIS — N6311 Unspecified lump in the right breast, upper outer quadrant: Secondary | ICD-10-CM | POA: Insufficient documentation

## 2023-08-18 ENCOUNTER — Other Ambulatory Visit: Payer: Self-pay

## 2023-08-18 DIAGNOSIS — I701 Atherosclerosis of renal artery: Secondary | ICD-10-CM

## 2023-09-08 ENCOUNTER — Ambulatory Visit (HOSPITAL_COMMUNITY)
Admission: RE | Admit: 2023-09-08 | Discharge: 2023-09-08 | Disposition: A | Discharge status: Home / Self Care | Source: Ambulatory Visit | Attending: Internal Medicine | Admitting: Internal Medicine

## 2023-09-08 DIAGNOSIS — I701 Atherosclerosis of renal artery: Secondary | ICD-10-CM | POA: Diagnosis not present

## 2023-09-13 ENCOUNTER — Ambulatory Visit: Payer: Self-pay | Admitting: Internal Medicine

## 2023-09-21 ENCOUNTER — Telehealth: Payer: Self-pay | Admitting: Internal Medicine

## 2023-09-21 NOTE — Telephone Encounter (Signed)
 Re-faxed to [facility],confirmed it went,patient aware

## 2023-09-21 NOTE — Telephone Encounter (Signed)
  Patient is calling because she was told a copy of her test results was being sent to her PCP, Lucie Mace. She states that she contacted her PCP office [facility]) today and they said they have not received a copy. Please advise.

## 2023-11-19 ENCOUNTER — Ambulatory Visit
Admission: RE | Admit: 2023-11-19 | Discharge: 2023-11-19 | Disposition: A | Discharge status: Home / Self Care | Payer: Self-pay | Attending: Family Medicine | Admitting: Family Medicine

## 2023-11-19 VITALS — BP 202/117 | HR 99 | Temp 98.6°F | Resp 18

## 2023-11-19 DIAGNOSIS — L02412 Cutaneous abscess of left axilla: Secondary | ICD-10-CM

## 2023-11-19 DIAGNOSIS — I160 Hypertensive urgency: Secondary | ICD-10-CM

## 2023-11-19 MED ORDER — TRAMADOL HCL 50 MG PO TABS: 50.0000 mg | ORAL_TABLET | Freq: Two times a day (BID) | ORAL | 0 refills | Status: AC | PRN

## 2023-11-19 MED ORDER — DOXYCYCLINE HYCLATE 100 MG PO CAPS: 100.0000 mg | ORAL_CAPSULE | Freq: Two times a day (BID) | ORAL | 0 refills | Status: AC

## 2023-11-19 NOTE — Discharge Instructions (Addendum)
 I have prescribed you an antibiotic to be taken twice a day as well as a pain medication to be taken twice daily as needed in addition to Tylenol .  Continue the warm compresses and follow-up for worsening or unresolving symptoms.  As discussed, it is imperative that you start taking your blood pressure medication consistently and check your home blood pressure readings.  Follow-up with your primary care provider for a recheck as soon as possible.

## 2023-11-19 NOTE — ED Triage Notes (Signed)
 Boil in left armpit area x 1 week.  Has been using warm compresses and using a heating pad.    States she has not been taking her BP medication

## 2023-11-19 NOTE — ED Provider Notes (Signed)
 [facility]    CSN: [id number] Arrival date & time: 11/19/23  1739      History   Chief Complaint Chief Complaint  Patient presents with   Arm Injury    Boil under left arm - Entered by patient    HPI [name] is a 43 y.o. female.   Patient presenting today with a painful swollen area to the left underarm for about the past week.  She states the area has been hard, warm and more painful with movement or pressure applied.  Denies injury to the area, fever, chills, bleeding, drainage.  Has been trying warm compresses with no relief.    Past Medical History:  Diagnosis Date   Anxiety    Atrophy of left kidney    Chronic heart failure with preserved ejection fraction (HFpEF) (HCC)    Chronic kidney disease, stage 3a (HCC)    Depression    History of kidney stones    Hydrosalpinx    Hypertension    followed by pcp   Mild CAD    Nephrolithiasis    per CT 05-06-2021  right renal stone nonostructive   Pain, female pelvic    Urgency of urination     Patient Active Problem List   Diagnosis Date Noted   Hypertensive urgency 03/22/2023   Chronic diastolic heart failure (HCC) 01/12/2023   Hypertensive heart disease 01/12/2023   Nicotine abuse 01/12/2023   Bacteriuria, asymptomatic 09/16/2022   PAD (peripheral artery disease) 06/11/2022   Apical variant hypertrophic cardiomyopathy (HCC) 03/03/2022   Elevated troponin    NSTEMI (non-ST elevated myocardial infarction) (HCC) 09/15/2021   Essential hypertension 09/15/2021   Depression 09/15/2021   Anxiety 09/15/2021   ARF (acute renal failure) 09/15/2021   Calculus of kidney 05/13/2021   S/P right rotator cuff repair 10/31/19 11/03/2019   Traumatic complete tear of left rotator cuff    Atrophy of left kidney 08/14/2013   Bladder stone 06/13/2011   Calcium  oxalate renal calculi 06/13/2011   Personal history of noncompliance with medical treatment, presenting hazards to health 06/13/2011   Retained  foreign body 06/13/2011   Urinary tract infection 06/13/2011   Urinary tract obstruction due to kidney stone 12/08/2010   Hydronephrosis 11/25/2010   Recurrent nephrolithiasis 11/25/2010   Renal colic on left side 11/07/2010   Pyelonephritis 11/07/2010   Dehydration 11/07/2010   Hypokalemia 11/07/2010   Flank pain 11/04/2010   UTI (urinary tract infection) 11/04/2010    Past Surgical History:  Procedure Laterality Date   APPENDECTOMY  age 12   ARTHOSCOPIC ROTAOR CUFF REPAIR Right 10/31/2019   Procedure: ARTHROSCOPIC ROTATOR CUFF REPAIR;  Surgeon: Margrette Taft BRAVO, MD;  Location: [facility];  Service: Orthopedics;  Laterality: Right;   BALLOON DILATION Left 01/17/2013   Procedure: BALLOON DILATION OF LEFT URETERAL STRICTURE;  Surgeon: Emery LILLETTE Blaze, MD;  Location: [facility];  Service: Urology;  Laterality: Left;   CYSTOSCOPY W/ URETERAL STENT PLACEMENT Left 01/17/2013   Procedure: CYSTOSCOPY WITH LEFT RETROGRADE PYELOGRAM; LEFT URETERAL STENT PLACEMENT;  Surgeon: Mohammad I Javaid, MD;  Location: [facility];  Service: Urology;  Laterality: Left;   CYSTOSCOPY WITH RETROGRADE PYELOGRAM, URETEROSCOPY AND STENT PLACEMENT Bilateral 12/23/2018   Procedure: CYSTOSCOPY WITH RETROGRADE PYELOGRAM, URETEROSCOPY AND STENT PLACEMENT;  Surgeon: Alvaro Hummer, MD;  Location: [facility];  Service: Urology;  Laterality: Bilateral;  90 MINS   CYSTOSCOPY WITH RETROGRADE PYELOGRAM, URETEROSCOPY AND STENT PLACEMENT Bilateral 01/06/2019   Procedure: CYSTOSCOPY WITH RETROGRADE PYELOGRAM, URETEROSCOPY AND  STENT PLACEMENT;  Surgeon: Alvaro Hummer, MD;  Location: [facility];  Service: Urology;  Laterality: Bilateral;  90 MINS   CYSTOSCOPY WITH RETROGRADE PYELOGRAM, URETEROSCOPY AND STENT PLACEMENT Bilateral 11/15/2020   Procedure: CYSTOSCOPY WITH RETROGRADE PYELOGRAM, LEFT DIAGNOSTIC URETEROSCOPY, RIGHT DIAGNOSTIC URETEROSCOPY WITH BIOPSY; RIGHT STENT PLACEMENT;  Surgeon: Alvaro Hummer, MD;  Location: [facility];  Service: Urology;  Laterality: Bilateral;  75 MINS   CYSTOSCOPY/URETEROSCOPY/HOLMIUM LASER/STENT PLACEMENT  2008;  2010;  2011;  2012   FLEXIBLE URETEROSCOPY Left 01/17/2013   Procedure: FLEXIBLE LEFT URETEROSCOPY; ATTEMPTED STONE BASKET EXTRACTION;  Surgeon: Emery LILLETTE Blaze, MD;  Location: [facility];  Service: Urology;  Laterality: Left;   HOLMIUM LASER APPLICATION Bilateral 12/23/2018   Procedure: HOLMIUM LASER APPLICATION;  Surgeon: Alvaro Hummer, MD;  Location: [facility];  Service: Urology;  Laterality: Bilateral;   HOLMIUM LASER APPLICATION Bilateral 01/06/2019   Procedure: HOLMIUM LASER APPLICATION;  Surgeon: Alvaro Hummer, MD;  Location: [facility];  Service: Urology;  Laterality: Bilateral;   LAPAROSCOPIC BILATERAL SALPINGECTOMY Bilateral 06/30/2021   Procedure: LAPAROSCOPIC BILATERAL SALPINGECTOMY; LYSIS OF ADHESIONS;  Surgeon: Lequita Evalene LABOR, MD;  Location: [facility];  Service: Gynecology;  Laterality: Bilateral;   LEFT HEART CATH AND CORONARY ANGIOGRAPHY N/A 09/17/2021   Procedure: LEFT HEART CATH AND CORONARY ANGIOGRAPHY;  Surgeon: Dann Candyce RAMAN, MD;  Location: [facility];  Service: Cardiovascular;  Laterality: N/A;   PERCUTANEOUS NEPHROSTOLITHOTOMY  06/15/2011   @[facility]     OB History     Gravida      Para      Term      Preterm      AB      Living  2      SAB      IAB      Ectopic      Multiple      Live Births               Home Medications    Prior to Admission medications   Medication Sig Start Date End Date Taking? Authorizing Provider  doxycycline (VIBRAMYCIN) 100 MG capsule Take 1 capsule (100 mg total) by mouth 2 (two) times daily. 11/19/23  Yes Stuart Vernell Norris, PA-C  traMADol  (ULTRAM ) 50 MG tablet Take 1 tablet (50 mg total) by mouth every 12 (twelve) hours as needed. 11/19/23  Yes Stuart Vernell Norris, PA-C   aspirin  EC 81 MG tablet Take 1 tablet (81 mg total) by mouth daily. Swallow whole. 11/03/21   Bhagat, Aleene, PA  atorvastatin  (LIPITOR ) 80 MG tablet Take 80 mg by mouth daily. 08/14/22   [provider]  carvedilol  (COREG ) 25 MG tablet Take 1 tablet (25 mg total) by mouth 2 (two) times daily. 10/21/21 01/12/24  Dunn, Dayna N, PA-C  DULoxetine  (CYMBALTA ) 30 MG capsule Take 1 capsule (30 mg total) by mouth daily. 11/04/22   Skeet Juliene SAUNDERS, DO  empagliflozin  (JARDIANCE ) 10 MG TABS tablet Take 1 tablet (10 mg total) by mouth daily. 11/03/21   Bhagat, Aleene, PA  nortriptyline  (PAMELOR ) 10 MG capsule Take 10 mg by mouth at bedtime. 11/04/22   [provider]  spironolactone  (ALDACTONE ) 25 MG tablet Take 1 tablet (25 mg total) by mouth daily. 01/12/23 05/17/23  Mallipeddi, Vishnu P, MD  valsartan  (DIOVAN ) 160 MG tablet Take 1 tablet (160 mg total) by mouth daily. 03/22/23   Santo Stanly LABOR, MD    Family History Family History  Problem  Relation Age of Onset   Hypertension Mother    Hypertension Father    Thyroid disease Father    Hypertension Brother    CAD Neg Hx     Social History Social History   Tobacco Use   Smoking status: Some Days    Types: Cigars   Smokeless tobacco: Never   Tobacco comments:    06-25-2021  per pt 6 per week, black mal small cigar's  Vaping Use   Vaping status: Never Used  Substance Use Topics   Alcohol use: Not Currently    Comment: occasional   Drug use: Never     Allergies   Adhesive [tape]   Review of Systems Review of Systems Per HPI  Physical Exam Triage Vital Signs ED Triage Vitals  Encounter Vitals Group     BP 11/19/23 1759 (!) 202/117     Girls Systolic BP Percentile --      Girls Diastolic BP Percentile --      Boys Systolic BP Percentile --      Boys Diastolic BP Percentile --      Pulse Rate 11/19/23 1759 99     Resp 11/19/23 1759 18     Temp 11/19/23 1759 98.6 F (37 C)     Temp Source 11/19/23  1759 Oral     SpO2 11/19/23 1759 97 %     Weight --      Height --      Head Circumference --      Peak Flow --      Pain Score 11/19/23 1801 8     Pain Loc --      Pain Education --      Exclude from Growth Chart --    No data found.  Updated Vital Signs BP (!) 202/117 (BP Location: Right Arm)   Pulse 99   Temp 98.6 F (37 C) (Oral)   Resp 18   LMP 11/08/2023 (Approximate)   SpO2 97%   Visual Acuity Right Eye Distance:   Left Eye Distance:   Bilateral Distance:    Right Eye Near:   Left Eye Near:    Bilateral Near:     Physical Exam Vitals and nursing note reviewed.  Constitutional:      Appearance: Normal appearance. She is not ill-appearing.  HENT:     Head: Atraumatic.  Eyes:     Extraocular Movements: Extraocular movements intact.     Conjunctiva/sclera: Conjunctivae normal.  Cardiovascular:     Rate and Rhythm: Normal rate and regular rhythm.     Heart sounds: Normal heart sounds.  Pulmonary:     Effort: Pulmonary effort is normal.     Breath sounds: Normal breath sounds.  Musculoskeletal:        General: Normal range of motion.     Cervical back: Normal range of motion and neck supple.  Skin:    General: Skin is warm and dry.     Comments: Firm 1 to 1.5 cm abscess to the left axilla, not indurated, nonfluctuant.  No active bleeding or drainage  Neurological:     Mental Status: She is alert and oriented to person, place, and time.  Psychiatric:        Mood and Affect: Mood normal.        Thought Content: Thought content normal.        Judgment: Judgment normal.      UC Treatments / Results  Labs (all labs ordered are listed, but only abnormal  results are displayed) Labs Reviewed - No data to display  EKG   Radiology No results found.  Procedures Procedures (including critical care time)  Medications Ordered in UC Medications - No data to display  Initial Impression / Assessment and Plan / UC Course  I have reviewed the triage vital  signs and the nursing notes.  Pertinent labs & imaging results that were available during my care of the patient were reviewed by me and considered in my medical decision making (see chart for details).     Options given for management of the axillary abscess, I&D and antibiotics versus antibiotics and supportive measures alone.  She is opting for holding off on the I&D at this time.  Will prescribe doxycycline, small supply of tramadol  for pain as she is unable to take NSAIDs due to her heart conditions, and discussed warm compresses, Tylenol  as needed.  Follow-up for worsening or unresolving symptoms.  Regarding her significantly elevated blood pressure readings, she states she is not currently taking her medications because her body is feeling good off of them.  Discussed the potentially life-threatening ramifications of her blood pressures being elevated to this level, particularly on an ongoing basis.  Strongly recommended restarting her medications and consistently taking.  Follow-up with primary care soon as possible.  Final Clinical Impressions(s) / UC Diagnoses   Final diagnoses:  Hypertensive urgency  Abscess of axilla, left     Discharge Instructions      I have prescribed you an antibiotic to be taken twice a day as well as a pain medication to be taken twice daily as needed in addition to Tylenol .  Continue the warm compresses and follow-up for worsening or unresolving symptoms.  As discussed, it is imperative that you start taking your blood pressure medication consistently and check your home blood pressure readings.  Follow-up with your primary care provider for a recheck as soon as possible.    ED Prescriptions     Medication Sig Dispense Auth. Provider   doxycycline (VIBRAMYCIN) 100 MG capsule Take 1 capsule (100 mg total) by mouth 2 (two) times daily. 20 capsule Stuart Vernell Norris, PA-C   traMADol  (ULTRAM ) 50 MG tablet Take 1 tablet (50 mg total) by mouth every 12  (twelve) hours as needed. 10 tablet Stuart Vernell Norris, [place]      I have reviewed the PDMP during this encounter.   Stuart Vernell Norris, [place] 11/19/23 [phone]

## 2024-03-07 ENCOUNTER — Other Ambulatory Visit (HOSPITAL_COMMUNITY): Payer: Self-pay | Admitting: Family Medicine

## 2024-03-07 DIAGNOSIS — I2510 Atherosclerotic heart disease of native coronary artery without angina pectoris: Secondary | ICD-10-CM

## 2024-03-07 DIAGNOSIS — I739 Peripheral vascular disease, unspecified: Secondary | ICD-10-CM

## 2024-03-14 ENCOUNTER — Ambulatory Visit (HOSPITAL_COMMUNITY)
Admission: RE | Admit: 2024-03-14 | Discharge: 2024-03-14 | Disposition: A | Discharge status: Home / Self Care | Source: Ambulatory Visit | Attending: Family Medicine | Admitting: Family Medicine

## 2024-03-14 DIAGNOSIS — I739 Peripheral vascular disease, unspecified: Secondary | ICD-10-CM | POA: Insufficient documentation

## 2024-03-14 DIAGNOSIS — I2510 Atherosclerotic heart disease of native coronary artery without angina pectoris: Secondary | ICD-10-CM | POA: Diagnosis present

## 2024-03-29 ENCOUNTER — Ambulatory Visit: Admitting: Internal Medicine
